# Patient Record
Sex: Female | Born: 1950 | Race: Black or African American | Hispanic: No | Marital: Single | Smoking: Current some day smoker
Health system: Southern US, Community
[De-identification: ages and names within clinical notes are randomized; demographics above are authoritative.]

## PROBLEM LIST (undated history)

## (undated) DIAGNOSIS — F2089 Other schizophrenia: Secondary | ICD-10-CM

## (undated) DIAGNOSIS — E785 Hyperlipidemia, unspecified: Secondary | ICD-10-CM

## (undated) DIAGNOSIS — G4730 Sleep apnea, unspecified: Secondary | ICD-10-CM

## (undated) DIAGNOSIS — G709 Myoneural disorder, unspecified: Secondary | ICD-10-CM

## (undated) DIAGNOSIS — G459 Transient cerebral ischemic attack, unspecified: Secondary | ICD-10-CM

## (undated) DIAGNOSIS — M1990 Unspecified osteoarthritis, unspecified site: Secondary | ICD-10-CM

## (undated) DIAGNOSIS — J189 Pneumonia, unspecified organism: Secondary | ICD-10-CM

## (undated) DIAGNOSIS — D649 Anemia, unspecified: Secondary | ICD-10-CM

## (undated) DIAGNOSIS — J449 Chronic obstructive pulmonary disease, unspecified: Secondary | ICD-10-CM

## (undated) DIAGNOSIS — I639 Cerebral infarction, unspecified: Secondary | ICD-10-CM

## (undated) DIAGNOSIS — C801 Malignant (primary) neoplasm, unspecified: Secondary | ICD-10-CM

## (undated) DIAGNOSIS — I10 Essential (primary) hypertension: Secondary | ICD-10-CM

## (undated) DIAGNOSIS — E1140 Type 2 diabetes mellitus with diabetic neuropathy, unspecified: Secondary | ICD-10-CM

## (undated) HISTORY — PX: CHOLECYSTECTOMY: SHX55

## (undated) HISTORY — PX: CAROTID ENDARTERECTOMY: SUR193

## (undated) HISTORY — DX: Hyperlipidemia, unspecified: E78.5

## (undated) HISTORY — DX: Anemia, unspecified: D64.9

## (undated) HISTORY — PX: TONSILLECTOMY: SUR1361

## (undated) HISTORY — DX: Unspecified osteoarthritis, unspecified site: M19.90

## (undated) HISTORY — DX: Malignant (primary) neoplasm, unspecified: C80.1

## (undated) HISTORY — DX: Cerebral infarction, unspecified: I63.9

---

## 2007-08-15 ENCOUNTER — Emergency Department (HOSPITAL_COMMUNITY): Admission: EM | Admit: 2007-08-15 | Discharge: 2007-08-16 | Payer: Self-pay | Admitting: Emergency Medicine

## 2007-08-29 ENCOUNTER — Emergency Department (HOSPITAL_COMMUNITY): Admission: EM | Admit: 2007-08-29 | Discharge: 2007-08-29 | Payer: Self-pay | Admitting: Emergency Medicine

## 2007-08-30 ENCOUNTER — Emergency Department (HOSPITAL_COMMUNITY): Admission: EM | Admit: 2007-08-30 | Discharge: 2007-08-31 | Payer: Self-pay | Admitting: Emergency Medicine

## 2007-09-13 ENCOUNTER — Emergency Department (HOSPITAL_COMMUNITY): Admission: EM | Admit: 2007-09-13 | Discharge: 2007-09-13 | Payer: Self-pay | Admitting: Emergency Medicine

## 2007-09-27 ENCOUNTER — Emergency Department (HOSPITAL_COMMUNITY): Admission: EM | Admit: 2007-09-27 | Discharge: 2007-09-27 | Payer: Self-pay | Admitting: Emergency Medicine

## 2007-10-13 ENCOUNTER — Ambulatory Visit: Payer: Self-pay | Admitting: Internal Medicine

## 2007-11-07 ENCOUNTER — Ambulatory Visit: Payer: Self-pay | Admitting: Internal Medicine

## 2007-11-15 ENCOUNTER — Ambulatory Visit: Payer: Self-pay | Admitting: Internal Medicine

## 2008-08-08 ENCOUNTER — Emergency Department (HOSPITAL_COMMUNITY): Admission: EM | Admit: 2008-08-08 | Discharge: 2008-08-08 | Payer: Self-pay | Admitting: Emergency Medicine

## 2008-10-25 ENCOUNTER — Emergency Department (HOSPITAL_COMMUNITY): Admission: EM | Admit: 2008-10-25 | Discharge: 2008-10-25 | Payer: Self-pay | Admitting: Emergency Medicine

## 2008-10-28 ENCOUNTER — Emergency Department (HOSPITAL_COMMUNITY): Admission: EM | Admit: 2008-10-28 | Discharge: 2008-10-28 | Payer: Self-pay | Admitting: Emergency Medicine

## 2008-10-28 ENCOUNTER — Emergency Department (HOSPITAL_COMMUNITY): Admission: EM | Admit: 2008-10-28 | Discharge: 2008-10-29 | Payer: Self-pay | Admitting: *Deleted

## 2008-11-11 ENCOUNTER — Emergency Department (HOSPITAL_COMMUNITY): Admission: EM | Admit: 2008-11-11 | Discharge: 2008-11-12 | Payer: Self-pay | Admitting: Emergency Medicine

## 2009-03-15 ENCOUNTER — Emergency Department (HOSPITAL_COMMUNITY): Admission: EM | Admit: 2009-03-15 | Discharge: 2009-03-16 | Payer: Self-pay | Admitting: General Surgery

## 2009-03-19 ENCOUNTER — Emergency Department (HOSPITAL_COMMUNITY): Admission: EM | Admit: 2009-03-19 | Discharge: 2009-03-20 | Payer: Self-pay | Admitting: Emergency Medicine

## 2009-03-20 ENCOUNTER — Encounter (INDEPENDENT_AMBULATORY_CARE_PROVIDER_SITE_OTHER): Payer: Self-pay | Admitting: *Deleted

## 2009-03-20 ENCOUNTER — Emergency Department (HOSPITAL_COMMUNITY): Admission: EM | Admit: 2009-03-20 | Discharge: 2009-03-21 | Payer: Self-pay | Admitting: Emergency Medicine

## 2009-04-25 ENCOUNTER — Encounter (INDEPENDENT_AMBULATORY_CARE_PROVIDER_SITE_OTHER): Payer: Self-pay | Admitting: *Deleted

## 2009-04-26 ENCOUNTER — Inpatient Hospital Stay (HOSPITAL_COMMUNITY): Admission: EM | Admit: 2009-04-26 | Discharge: 2009-04-28 | Payer: Self-pay | Admitting: Emergency Medicine

## 2009-04-27 ENCOUNTER — Encounter (INDEPENDENT_AMBULATORY_CARE_PROVIDER_SITE_OTHER): Payer: Self-pay | Admitting: *Deleted

## 2009-04-28 ENCOUNTER — Encounter (INDEPENDENT_AMBULATORY_CARE_PROVIDER_SITE_OTHER): Payer: Self-pay | Admitting: *Deleted

## 2009-05-06 ENCOUNTER — Emergency Department (HOSPITAL_COMMUNITY): Admission: EM | Admit: 2009-05-06 | Discharge: 2009-05-06 | Payer: Self-pay | Admitting: Emergency Medicine

## 2009-05-13 DIAGNOSIS — E119 Type 2 diabetes mellitus without complications: Secondary | ICD-10-CM

## 2009-05-13 DIAGNOSIS — I10 Essential (primary) hypertension: Secondary | ICD-10-CM | POA: Insufficient documentation

## 2009-05-13 DIAGNOSIS — J449 Chronic obstructive pulmonary disease, unspecified: Secondary | ICD-10-CM

## 2009-05-13 DIAGNOSIS — J4489 Other specified chronic obstructive pulmonary disease: Secondary | ICD-10-CM | POA: Insufficient documentation

## 2009-05-13 DIAGNOSIS — J45909 Unspecified asthma, uncomplicated: Secondary | ICD-10-CM | POA: Insufficient documentation

## 2009-05-14 ENCOUNTER — Ambulatory Visit: Payer: Self-pay | Admitting: Gastroenterology

## 2009-05-14 DIAGNOSIS — R197 Diarrhea, unspecified: Secondary | ICD-10-CM | POA: Insufficient documentation

## 2009-05-14 DIAGNOSIS — R079 Chest pain, unspecified: Secondary | ICD-10-CM | POA: Insufficient documentation

## 2009-05-23 ENCOUNTER — Ambulatory Visit: Payer: Self-pay | Admitting: Gastroenterology

## 2009-05-23 ENCOUNTER — Ambulatory Visit (HOSPITAL_COMMUNITY): Admission: RE | Admit: 2009-05-23 | Discharge: 2009-05-23 | Payer: Self-pay | Admitting: Gastroenterology

## 2009-06-25 ENCOUNTER — Ambulatory Visit (HOSPITAL_BASED_OUTPATIENT_CLINIC_OR_DEPARTMENT_OTHER): Admission: RE | Admit: 2009-06-25 | Discharge: 2009-06-25 | Payer: Self-pay | Admitting: Internal Medicine

## 2009-06-26 ENCOUNTER — Emergency Department (HOSPITAL_COMMUNITY): Admission: EM | Admit: 2009-06-26 | Discharge: 2009-06-26 | Payer: Self-pay | Admitting: Emergency Medicine

## 2009-06-28 ENCOUNTER — Ambulatory Visit: Payer: Self-pay | Admitting: Internal Medicine

## 2009-07-23 ENCOUNTER — Encounter (INDEPENDENT_AMBULATORY_CARE_PROVIDER_SITE_OTHER): Payer: Self-pay | Admitting: Internal Medicine

## 2009-07-23 ENCOUNTER — Ambulatory Visit: Payer: Self-pay | Admitting: Cardiovascular Disease

## 2009-07-23 ENCOUNTER — Inpatient Hospital Stay (HOSPITAL_COMMUNITY): Admission: EM | Admit: 2009-07-23 | Discharge: 2009-07-25 | Payer: Self-pay | Admitting: Emergency Medicine

## 2009-07-24 ENCOUNTER — Encounter: Payer: Self-pay | Admitting: Cardiovascular Disease

## 2009-08-18 ENCOUNTER — Ambulatory Visit: Payer: Self-pay | Admitting: Internal Medicine

## 2009-08-18 ENCOUNTER — Observation Stay (HOSPITAL_COMMUNITY): Admission: EM | Admit: 2009-08-18 | Discharge: 2009-08-19 | Payer: Self-pay | Admitting: Emergency Medicine

## 2009-11-15 ENCOUNTER — Emergency Department (HOSPITAL_COMMUNITY): Admission: EM | Admit: 2009-11-15 | Discharge: 2009-11-15 | Payer: Self-pay | Admitting: Emergency Medicine

## 2009-11-25 ENCOUNTER — Emergency Department (HOSPITAL_COMMUNITY): Admission: EM | Admit: 2009-11-25 | Discharge: 2009-11-25 | Payer: Self-pay | Admitting: Emergency Medicine

## 2009-11-28 ENCOUNTER — Emergency Department (HOSPITAL_COMMUNITY): Admission: EM | Admit: 2009-11-28 | Discharge: 2009-11-28 | Payer: Self-pay | Admitting: Emergency Medicine

## 2009-12-07 ENCOUNTER — Emergency Department (HOSPITAL_COMMUNITY): Admission: EM | Admit: 2009-12-07 | Discharge: 2009-12-07 | Payer: Self-pay | Admitting: Emergency Medicine

## 2009-12-07 ENCOUNTER — Encounter: Payer: Self-pay | Admitting: Cardiovascular Disease

## 2009-12-29 ENCOUNTER — Emergency Department (HOSPITAL_COMMUNITY): Admission: EM | Admit: 2009-12-29 | Discharge: 2009-12-29 | Payer: Self-pay | Admitting: Emergency Medicine

## 2010-04-14 ENCOUNTER — Emergency Department (HOSPITAL_COMMUNITY): Admission: EM | Admit: 2010-04-14 | Discharge: 2010-04-15 | Payer: Self-pay | Admitting: Emergency Medicine

## 2010-04-21 ENCOUNTER — Emergency Department (HOSPITAL_COMMUNITY): Admission: EM | Admit: 2010-04-21 | Discharge: 2010-04-21 | Payer: Self-pay | Admitting: Emergency Medicine

## 2010-04-24 ENCOUNTER — Emergency Department (HOSPITAL_COMMUNITY): Admission: EM | Admit: 2010-04-24 | Discharge: 2010-04-25 | Payer: Self-pay | Admitting: Emergency Medicine

## 2010-04-29 ENCOUNTER — Emergency Department (HOSPITAL_COMMUNITY): Admission: EM | Admit: 2010-04-29 | Discharge: 2010-04-29 | Payer: Self-pay | Admitting: Emergency Medicine

## 2010-05-03 ENCOUNTER — Emergency Department (HOSPITAL_COMMUNITY): Admission: EM | Admit: 2010-05-03 | Discharge: 2010-05-04 | Payer: Self-pay | Admitting: Emergency Medicine

## 2010-05-17 IMAGING — CR DG CHEST 1V PORT
1 series · 1 of 1 positions shown · non-contrast
Comparison: 07/22/2009

CLINICAL DATA: Chest pain

PORTABLE CHEST - 1 VIEW

[AP]
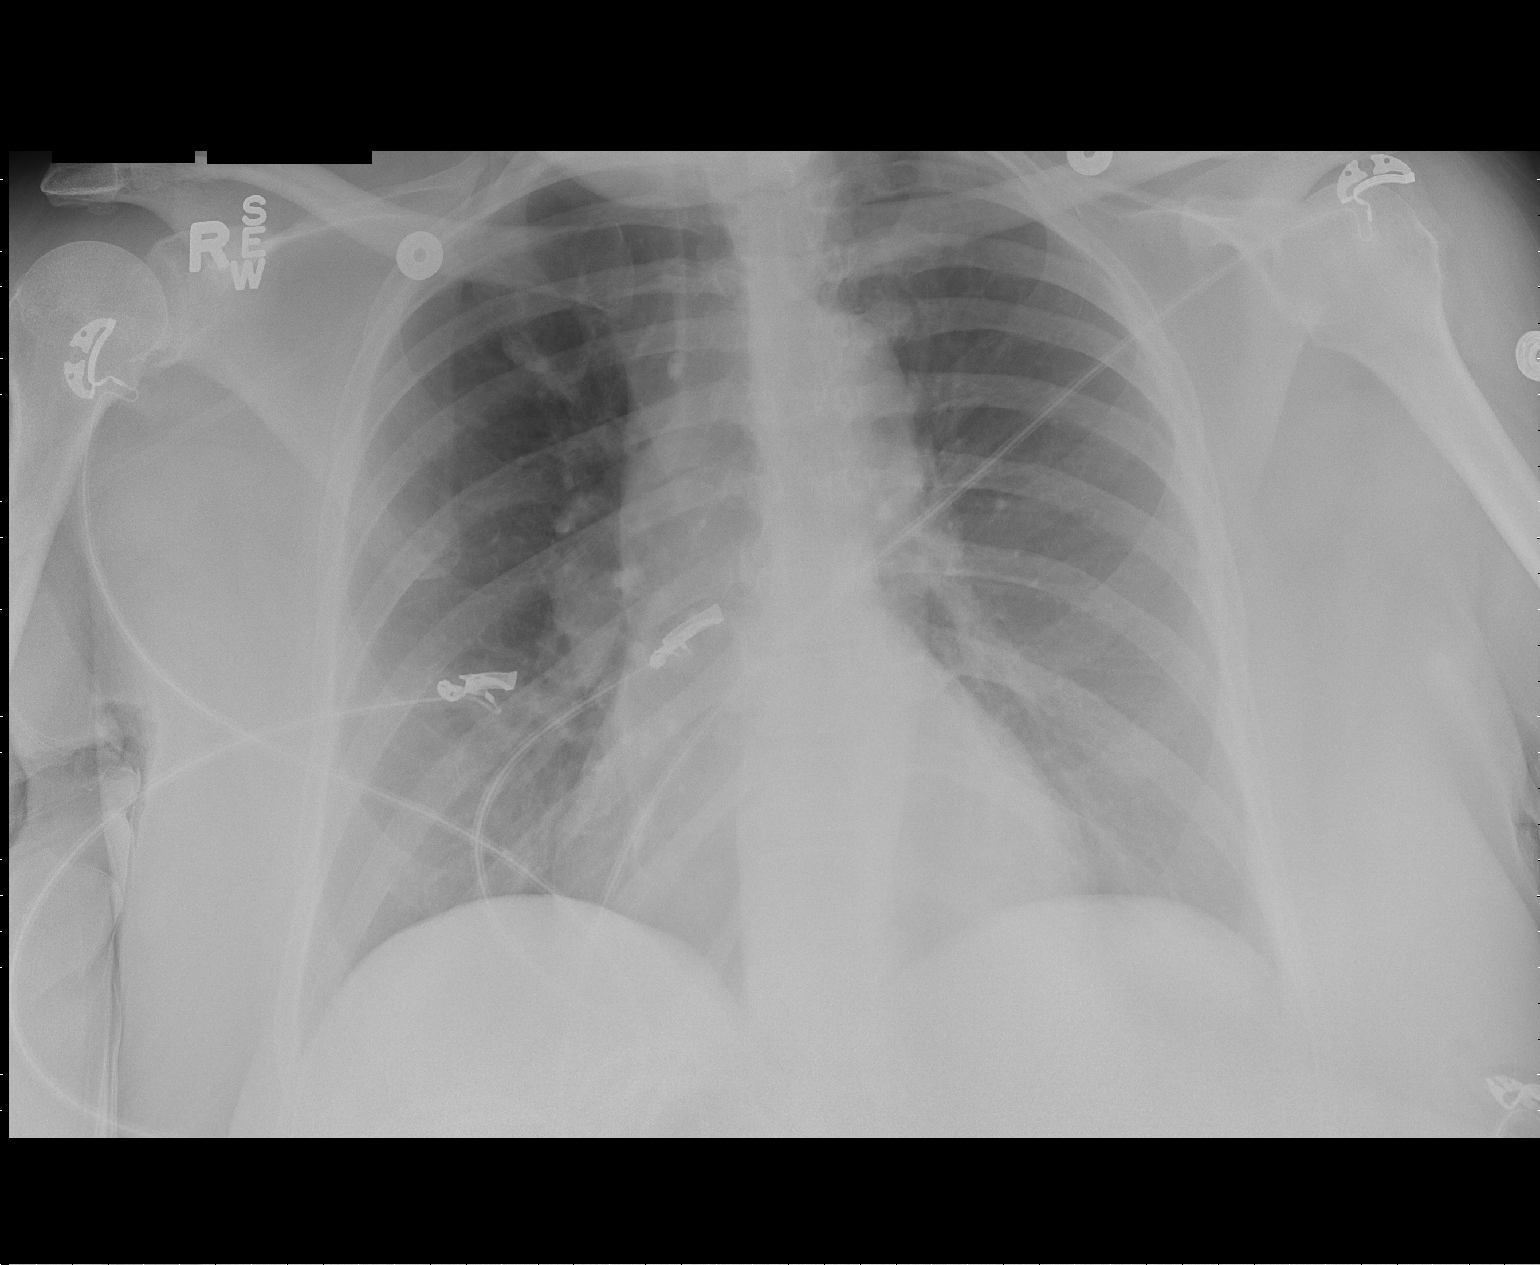

[1 of 1 positions shown; findings below may reference images not displayed]

FINDINGS: Heart size is at the upper limits of normal.  Lungs are
clear.  Incidental note is made of prominence of the right first
anterior costochondral cartilage.  No pleural effusion.
IMPRESSION: No acute cardiopulmonary process.

## 2010-05-26 ENCOUNTER — Encounter: Admission: RE | Admit: 2010-05-26 | Discharge: 2010-05-26 | Payer: Self-pay | Admitting: Gastroenterology

## 2010-06-23 ENCOUNTER — Emergency Department (HOSPITAL_COMMUNITY): Admission: EM | Admit: 2010-06-23 | Discharge: 2010-06-24 | Payer: Self-pay | Admitting: Emergency Medicine

## 2010-09-12 ENCOUNTER — Encounter (INDEPENDENT_AMBULATORY_CARE_PROVIDER_SITE_OTHER): Payer: Self-pay | Admitting: Internal Medicine

## 2010-09-12 ENCOUNTER — Inpatient Hospital Stay (HOSPITAL_COMMUNITY)
Admission: EM | Admit: 2010-09-12 | Discharge: 2010-09-14 | Payer: Self-pay | Source: Home / Self Care | Admitting: Emergency Medicine

## 2010-09-14 ENCOUNTER — Encounter (INDEPENDENT_AMBULATORY_CARE_PROVIDER_SITE_OTHER): Payer: Self-pay | Admitting: Internal Medicine

## 2010-11-22 ENCOUNTER — Encounter: Payer: Self-pay | Admitting: Gastroenterology

## 2010-11-22 ENCOUNTER — Encounter: Payer: Self-pay | Admitting: Internal Medicine

## 2010-12-02 NOTE — Consult Note (Signed)
[facility]: [facility]   Imported By: Roderic Ovens 12/22/2009 14:10:02  _____________________________________________________________________  External Attachment:    Type:   Image     Comment:   External Document

## 2011-01-12 LAB — GLUCOSE, CAPILLARY
Glucose-Capillary: 132 mg/dL — ABNORMAL HIGH (ref 70–99)
Glucose-Capillary: 133 mg/dL — ABNORMAL HIGH (ref 70–99)
Glucose-Capillary: 138 mg/dL — ABNORMAL HIGH (ref 70–99)
Glucose-Capillary: 138 mg/dL — ABNORMAL HIGH (ref 70–99)
Glucose-Capillary: 155 mg/dL — ABNORMAL HIGH (ref 70–99)

## 2011-01-12 LAB — DIFFERENTIAL
Basophils Absolute: 0.1 10*3/uL (ref 0.0–0.1)
Eosinophils Absolute: 0.5 10*3/uL (ref 0.0–0.7)
Eosinophils Relative: 4 % (ref 0–5)
Lymphs Abs: 4.2 10*3/uL — ABNORMAL HIGH (ref 0.7–4.0)
Lymphs Abs: 5.6 10*3/uL — ABNORMAL HIGH (ref 0.7–4.0)
Neutrophils Relative %: 47 % (ref 43–77)

## 2011-01-12 LAB — BASIC METABOLIC PANEL
Calcium: 8.9 mg/dL (ref 8.4–10.5)
Chloride: 103 mEq/L (ref 96–112)
Creatinine, Ser: 0.58 mg/dL (ref 0.4–1.2)
GFR calc Af Amer: >60 mL/min (ref >60)
GFR calc Af Amer: >60 mL/min (ref >60)
GFR calc non Af Amer: >60 mL/min (ref >60)
Potassium: 3.4 mEq/L — ABNORMAL LOW (ref 3.5–5.1)
Sodium: 141 mEq/L (ref 135–145)

## 2011-01-12 LAB — RAPID URINE DRUG SCREEN, HOSP PERFORMED
Amphetamines: NOT DETECTED
Benzodiazepines: NOT DETECTED

## 2011-01-12 LAB — CBC
HCT: 35.5 % — ABNORMAL LOW (ref 36.0–46.0)
HCT: 36.8 % (ref 36.0–46.0)
Hemoglobin: 11.4 g/dL — ABNORMAL LOW (ref 12.0–15.0)
MCH: 26.3 pg (ref 26.0–34.0)
MCH: 27 pg (ref 26.0–34.0)
MCV: 84.8 fL (ref 78.0–100.0)
Platelets: 433 10*3/uL — ABNORMAL HIGH (ref 150–400)
RBC: 4.17 MIL/uL (ref 3.87–5.11)
RBC: 4.19 MIL/uL (ref 3.87–5.11)
RBC: 4.34 MIL/uL (ref 3.87–5.11)
RDW: 16.8 % — ABNORMAL HIGH (ref 11.5–15.5)
WBC: 12.2 10*3/uL — ABNORMAL HIGH (ref 4.0–10.5)

## 2011-01-12 LAB — LIPID PANEL
Cholesterol: 162 mg/dL (ref 0–200)
LDL Cholesterol: 90 mg/dL (ref 0–99)
Triglycerides: 162 mg/dL — ABNORMAL HIGH (ref <150)
VLDL: 32 mg/dL (ref 0–40)

## 2011-01-12 LAB — URINALYSIS, ROUTINE W REFLEX MICROSCOPIC
Protein, ur: NEGATIVE mg/dL
Urobilinogen, UA: 0.2 mg/dL (ref 0.0–1.0)

## 2011-01-12 LAB — COMPREHENSIVE METABOLIC PANEL
ALT: 15 U/L (ref 0–35)
AST: 16 U/L (ref 0–37)
CO2: 29 mEq/L (ref 19–32)
Calcium: 8.9 mg/dL (ref 8.4–10.5)
Chloride: 107 mEq/L (ref 96–112)
GFR calc Af Amer: >60 mL/min (ref >60)
GFR calc non Af Amer: >60 mL/min (ref >60)
Sodium: 146 mEq/L — ABNORMAL HIGH (ref 135–145)

## 2011-01-12 LAB — TROPONIN I: Troponin I: 0.02 ng/mL (ref 0.00–0.06)

## 2011-01-12 LAB — APTT: aPTT: 28 seconds (ref 24–37)

## 2011-01-12 LAB — HEMOGLOBIN A1C: Hgb A1c MFr Bld: 7.3 % — ABNORMAL HIGH (ref <5.7)

## 2011-01-12 LAB — URINE CULTURE: Culture: NO GROWTH

## 2011-01-15 LAB — URINE CULTURE
Colony Count: 70000
Culture  Setup Time: 201108241035

## 2011-01-15 LAB — URINALYSIS, ROUTINE W REFLEX MICROSCOPIC
Bilirubin Urine: NEGATIVE
Glucose, UA: NEGATIVE mg/dL
Hgb urine dipstick: NEGATIVE
Ketones, ur: NEGATIVE mg/dL
pH: 6 (ref 5.0–8.0)

## 2011-01-15 LAB — DIFFERENTIAL
Basophils Absolute: 0.1 10*3/uL (ref 0.0–0.1)
Basophils Relative: 0 % (ref 0–1)
Monocytes Absolute: 1.1 10*3/uL — ABNORMAL HIGH (ref 0.1–1.0)
Neutro Abs: 11.7 10*3/uL — ABNORMAL HIGH (ref 1.7–7.7)
Neutrophils Relative %: 62 % (ref 43–77)

## 2011-01-15 LAB — POCT I-STAT, CHEM 8
Creatinine, Ser: 0.7 mg/dL (ref 0.4–1.2)
Hemoglobin: 14.3 g/dL (ref 12.0–15.0)
Sodium: 132 mEq/L — ABNORMAL LOW (ref 135–145)
TCO2: 27 mmol/L (ref 0–100)

## 2011-01-15 LAB — CBC
HCT: 36.5 % (ref 36.0–46.0)
MCHC: 33.4 g/dL (ref 30.0–36.0)
RDW: 16.3 % — ABNORMAL HIGH (ref 11.5–15.5)

## 2011-01-15 LAB — POCT CARDIAC MARKERS: Myoglobin, poc: 79 ng/mL (ref 12–200)

## 2011-01-15 LAB — RAPID STREP SCREEN (MED CTR MEBANE ONLY): Streptococcus, Group A Screen (Direct): NEGATIVE

## 2011-01-17 LAB — BASIC METABOLIC PANEL
CO2: 26 mEq/L (ref 19–32)
Calcium: 8.8 mg/dL (ref 8.4–10.5)
Creatinine, Ser: 0.61 mg/dL (ref 0.4–1.2)
GFR calc Af Amer: >60 mL/min (ref >60)
GFR calc non Af Amer: >60 mL/min (ref >60)
Sodium: 136 mEq/L (ref 135–145)

## 2011-01-17 LAB — DIFFERENTIAL
Basophils Absolute: 0 10*3/uL (ref 0.0–0.1)
Basophils Absolute: 0.1 10*3/uL (ref 0.0–0.1)
Basophils Relative: 0 % (ref 0–1)
Basophils Relative: 1 % (ref 0–1)
Eosinophils Absolute: 0.4 10*3/uL (ref 0.0–0.7)
Eosinophils Absolute: 0.5 10*3/uL (ref 0.0–0.7)
Eosinophils Relative: 3 % (ref 0–5)
Eosinophils Relative: 4 % (ref 0–5)
Lymphocytes Relative: 30 % (ref 12–46)
Lymphocytes Relative: 24 % (ref 12–46)
Lymphs Abs: 4.1 10*3/uL — ABNORMAL HIGH (ref 0.7–4.0)
Lymphs Abs: 5.4 10*3/uL — ABNORMAL HIGH (ref 0.7–4.0)
Monocytes Absolute: 1.1 10*3/uL — ABNORMAL HIGH (ref 0.1–1.0)
Monocytes Relative: 6 % (ref 3–12)
Monocytes Relative: 6 % (ref 3–12)
Neutro Abs: 6.6 10*3/uL (ref 1.7–7.7)
Neutro Abs: 11.1 10*3/uL — ABNORMAL HIGH (ref 1.7–7.7)
Neutro Abs: 9.6 10*3/uL — ABNORMAL HIGH (ref 1.7–7.7)
Neutrophils Relative %: 58 % (ref 43–77)
Neutrophils Relative %: 66 % (ref 43–77)

## 2011-01-17 LAB — GLUCOSE, CAPILLARY: Glucose-Capillary: 112 mg/dL — ABNORMAL HIGH (ref 70–99)

## 2011-01-17 LAB — COMPREHENSIVE METABOLIC PANEL
ALT: 11 U/L (ref 0–35)
ALT: <8 U/L (ref 0–35)
Alkaline Phosphatase: 75 U/L (ref 39–117)
Alkaline Phosphatase: 76 U/L (ref 39–117)
BUN: 7 mg/dL (ref 6–23)
CO2: 25 mEq/L (ref 19–32)
CO2: 27 mEq/L (ref 19–32)
Calcium: 8.8 mg/dL (ref 8.4–10.5)
Calcium: 8.6 mg/dL (ref 8.4–10.5)
GFR calc non Af Amer: >60 mL/min (ref >60)
GFR calc non Af Amer: >60 mL/min (ref >60)
Glucose, Bld: 105 mg/dL — ABNORMAL HIGH (ref 70–99)
Glucose, Bld: 132 mg/dL — ABNORMAL HIGH (ref 70–99)
Potassium: 3.6 mEq/L (ref 3.5–5.1)
Sodium: 136 mEq/L (ref 135–145)
Sodium: 134 mEq/L — ABNORMAL LOW (ref 135–145)
Total Bilirubin: 0.3 mg/dL (ref 0.3–1.2)
Total Protein: 6.5 g/dL (ref 6.0–8.3)

## 2011-01-17 LAB — URINALYSIS, ROUTINE W REFLEX MICROSCOPIC
Bilirubin Urine: NEGATIVE
Glucose, UA: NEGATIVE mg/dL
Hgb urine dipstick: NEGATIVE
Ketones, ur: NEGATIVE mg/dL
Nitrite: NEGATIVE
Protein, ur: NEGATIVE mg/dL
Protein, ur: NEGATIVE mg/dL
Specific Gravity, Urine: 1.006 (ref 1.005–1.030)
Urobilinogen, UA: 0.2 mg/dL (ref 0.0–1.0)
pH: 5 (ref 5.0–8.0)

## 2011-01-17 LAB — CBC
HCT: 36.3 % (ref 36.0–46.0)
HCT: 41.5 % (ref 36.0–46.0)
Hemoglobin: 11.3 g/dL — ABNORMAL LOW (ref 12.0–15.0)
MCHC: 32.7 g/dL (ref 30.0–36.0)
MCHC: 33.5 g/dL (ref 30.0–36.0)
MCHC: 32.6 g/dL (ref 30.0–36.0)
MCV: 85.3 fL (ref 78.0–100.0)
Platelets: 355 10*3/uL (ref 150–400)
Platelets: 432 10*3/uL — ABNORMAL HIGH (ref 150–400)
Platelets: 359 10*3/uL (ref 150–400)
RBC: 4.06 MIL/uL (ref 3.87–5.11)
RBC: 4.3 MIL/uL (ref 3.87–5.11)
RDW: 15.6 % — ABNORMAL HIGH (ref 11.5–15.5)
RDW: 15.7 % — ABNORMAL HIGH (ref 11.5–15.5)
RDW: 17.2 % — ABNORMAL HIGH (ref 11.5–15.5)
WBC: 16.6 10*3/uL — ABNORMAL HIGH (ref 4.0–10.5)
WBC: 16.9 10*3/uL — ABNORMAL HIGH (ref 4.0–10.5)

## 2011-01-17 LAB — POCT I-STAT, CHEM 8
BUN: 8 mg/dL (ref 6–23)
HCT: 45 % (ref 36.0–46.0)
Sodium: 135 mEq/L (ref 135–145)
TCO2: 25 mmol/L (ref 0–100)

## 2011-01-17 LAB — POCT CARDIAC MARKERS
CKMB, poc: 1.1 ng/mL (ref 1.0–8.0)
Myoglobin, poc: 74 ng/mL (ref 12–200)
Troponin i, poc: <0.05 ng/mL (ref 0.00–0.09)

## 2011-01-17 LAB — RAPID URINE DRUG SCREEN, HOSP PERFORMED
Amphetamines: NOT DETECTED
Benzodiazepines: NOT DETECTED
Cocaine: NOT DETECTED
Opiates: NOT DETECTED
Tetrahydrocannabinol: NOT DETECTED

## 2011-01-17 LAB — LIPASE, BLOOD: Lipase: 27 U/L (ref 11–59)

## 2011-01-20 LAB — RAPID URINE DRUG SCREEN, HOSP PERFORMED
Amphetamines: NOT DETECTED
Barbiturates: NOT DETECTED
Benzodiazepines: NOT DETECTED
Opiates: NOT DETECTED
Tetrahydrocannabinol: NOT DETECTED

## 2011-01-20 LAB — BASIC METABOLIC PANEL
BUN: 7 mg/dL (ref 6–23)
CO2: 24 mEq/L (ref 19–32)
Chloride: 105 mEq/L (ref 96–112)
GFR calc non Af Amer: >60 mL/min (ref >60)
Glucose, Bld: 116 mg/dL — ABNORMAL HIGH (ref 70–99)
Potassium: 3.8 mEq/L (ref 3.5–5.1)
Sodium: 139 mEq/L (ref 135–145)

## 2011-01-20 LAB — CBC
HCT: 34.6 % — ABNORMAL LOW (ref 36.0–46.0)
HCT: 39 % (ref 36.0–46.0)
Hemoglobin: 11.6 g/dL — ABNORMAL LOW (ref 12.0–15.0)
MCHC: 33.5 g/dL (ref 30.0–36.0)
MCV: 84.8 fL (ref 78.0–100.0)
Platelets: 353 10*3/uL (ref 150–400)
RDW: 16.1 % — ABNORMAL HIGH (ref 11.5–15.5)
RDW: 17 % — ABNORMAL HIGH (ref 11.5–15.5)

## 2011-01-20 LAB — POCT CARDIAC MARKERS: CKMB, poc: 1.2 ng/mL (ref 1.0–8.0)

## 2011-01-20 LAB — DIFFERENTIAL
Basophils Absolute: 0.1 10*3/uL (ref 0.0–0.1)
Basophils Absolute: 0.1 10*3/uL (ref 0.0–0.1)
Eosinophils Absolute: 0.5 10*3/uL (ref 0.0–0.7)
Eosinophils Absolute: 0.7 10*3/uL (ref 0.0–0.7)
Eosinophils Relative: 3 % (ref 0–5)
Eosinophils Relative: 5 % (ref 0–5)
Lymphocytes Relative: 29 % (ref 12–46)
Lymphocytes Relative: 34 % (ref 12–46)
Monocytes Absolute: 1 10*3/uL (ref 0.1–1.0)
Neutrophils Relative %: 59 % (ref 43–77)

## 2011-01-20 LAB — PROTIME-INR
INR: 0.96 (ref 0.00–1.49)
Prothrombin Time: 12.7 seconds (ref 11.6–15.2)

## 2011-01-20 LAB — POCT I-STAT, CHEM 8
Calcium, Ion: 1.12 mmol/L (ref 1.12–1.32)
Glucose, Bld: 131 mg/dL — ABNORMAL HIGH (ref 70–99)
HCT: 40 % (ref 36.0–46.0)
Hemoglobin: 13.6 g/dL (ref 12.0–15.0)
Potassium: 3.7 mEq/L (ref 3.5–5.1)

## 2011-02-04 LAB — GLUCOSE, CAPILLARY
Glucose-Capillary: 135 mg/dL — ABNORMAL HIGH (ref 70–99)
Glucose-Capillary: 140 mg/dL — ABNORMAL HIGH (ref 70–99)
Glucose-Capillary: 187 mg/dL — ABNORMAL HIGH (ref 70–99)
Glucose-Capillary: 204 mg/dL — ABNORMAL HIGH (ref 70–99)
Glucose-Capillary: 94 mg/dL (ref 70–99)

## 2011-02-04 LAB — COMPREHENSIVE METABOLIC PANEL
ALT: 26 U/L (ref 0–35)
AST: 42 U/L — ABNORMAL HIGH (ref 0–37)
Albumin: 3.2 g/dL — ABNORMAL LOW (ref 3.5–5.2)
Alkaline Phosphatase: 78 U/L (ref 39–117)
BUN: 5 mg/dL — ABNORMAL LOW (ref 6–23)
CO2: 27 mEq/L (ref 19–32)
Calcium: 8.8 mg/dL (ref 8.4–10.5)
Chloride: 103 mEq/L (ref 96–112)
Creatinine, Ser: 0.52 mg/dL (ref 0.4–1.2)
GFR calc Af Amer: >60 mL/min (ref >60)
GFR calc non Af Amer: >60 mL/min (ref >60)
Glucose, Bld: 137 mg/dL — ABNORMAL HIGH (ref 70–99)
Potassium: 3.6 mEq/L (ref 3.5–5.1)
Sodium: 140 mEq/L (ref 135–145)
Total Bilirubin: 0.1 mg/dL — ABNORMAL LOW (ref 0.3–1.2)
Total Protein: 6.9 g/dL (ref 6.0–8.3)

## 2011-02-04 LAB — CBC
HCT: 37.7 % (ref 36.0–46.0)
Hemoglobin: 12.5 g/dL (ref 12.0–15.0)
MCHC: 33.2 g/dL (ref 30.0–36.0)
MCHC: 33.5 g/dL (ref 30.0–36.0)
MCV: 84.8 fL (ref 78.0–100.0)
MCV: 85.1 fL (ref 78.0–100.0)
Platelets: 385 10*3/uL (ref 150–400)
Platelets: 399 10*3/uL (ref 150–400)
RBC: 4.43 MIL/uL (ref 3.87–5.11)
RDW: 18 % — ABNORMAL HIGH (ref 11.5–15.5)
RDW: 18.3 % — ABNORMAL HIGH (ref 11.5–15.5)
WBC: 13.9 10*3/uL — ABNORMAL HIGH (ref 4.0–10.5)

## 2011-02-04 LAB — DIFFERENTIAL
Basophils Absolute: 0.1 10*3/uL (ref 0.0–0.1)
Basophils Relative: 1 % (ref 0–1)
Eosinophils Absolute: 0.5 10*3/uL (ref 0.0–0.7)
Monocytes Relative: 8 % (ref 3–12)
Neutro Abs: 8.3 10*3/uL — ABNORMAL HIGH (ref 1.7–7.7)
Neutrophils Relative %: 57 % (ref 43–77)

## 2011-02-04 LAB — PROTIME-INR
INR: 0.96 (ref 0.00–1.49)
Prothrombin Time: 12.7 seconds (ref 11.6–15.2)

## 2011-02-04 LAB — CARDIAC PANEL(CRET KIN+CKTOT+MB+TROPI)
CK, MB: 1.2 ng/mL (ref 0.3–4.0)
Relative Index: INVALID (ref 0.0–2.5)
Total CK: 82 U/L (ref 7–177)
Total CK: 89 U/L (ref 7–177)
Troponin I: 0.01 ng/mL (ref 0.00–0.06)

## 2011-02-04 LAB — BASIC METABOLIC PANEL
BUN: 6 mg/dL (ref 6–23)
CO2: 27 mEq/L (ref 19–32)
Calcium: 8.8 mg/dL (ref 8.4–10.5)
Chloride: 103 mEq/L (ref 96–112)
Creatinine, Ser: 0.45 mg/dL (ref 0.4–1.2)
GFR calc Af Amer: >60 mL/min (ref >60)
Glucose, Bld: 127 mg/dL — ABNORMAL HIGH (ref 70–99)

## 2011-02-04 LAB — HEMOGLOBIN A1C
Hgb A1c MFr Bld: 6.5 % — ABNORMAL HIGH (ref 4.6–6.1)
Mean Plasma Glucose: 140 mg/dL

## 2011-02-04 LAB — POCT CARDIAC MARKERS
CKMB, poc: 1.7 ng/mL (ref 1.0–8.0)
CKMB, poc: <1 ng/mL — ABNORMAL LOW (ref 1.0–8.0)
Myoglobin, poc: 43.7 ng/mL (ref 12–200)
Myoglobin, poc: 44 ng/mL (ref 12–200)

## 2011-02-04 LAB — LIPID PANEL
Cholesterol: 144 mg/dL (ref 0–200)
HDL: 44 mg/dL (ref >39)

## 2011-02-05 LAB — CBC
HCT: 38.9 % (ref 36.0–46.0)
HCT: 40.7 % (ref 36.0–46.0)
Hemoglobin: 12.8 g/dL (ref 12.0–15.0)
MCHC: 32.6 g/dL (ref 30.0–36.0)
MCV: 84.3 fL (ref 78.0–100.0)
MCV: 84.4 fL (ref 78.0–100.0)
RBC: 4.82 MIL/uL (ref 3.87–5.11)
WBC: 10.1 10*3/uL (ref 4.0–10.5)
WBC: 13.8 10*3/uL — ABNORMAL HIGH (ref 4.0–10.5)

## 2011-02-05 LAB — CK TOTAL AND CKMB (NOT AT ARMC)
CK, MB: 1.3 ng/mL (ref 0.3–4.0)
Total CK: 106 U/L (ref 7–177)

## 2011-02-05 LAB — POCT I-STAT, CHEM 8
Chloride: 104 mEq/L (ref 96–112)
Glucose, Bld: 94 mg/dL (ref 70–99)
HCT: 39 % (ref 36.0–46.0)
Hemoglobin: 13.3 g/dL (ref 12.0–15.0)
Potassium: 3.2 mEq/L — ABNORMAL LOW (ref 3.5–5.1)
Sodium: 140 mEq/L (ref 135–145)

## 2011-02-05 LAB — COMPREHENSIVE METABOLIC PANEL
ALT: 17 U/L (ref 0–35)
AST: 16 U/L (ref 0–37)
Albumin: 3.1 g/dL — ABNORMAL LOW (ref 3.5–5.2)
Alkaline Phosphatase: 69 U/L (ref 39–117)
CO2: 26 mEq/L (ref 19–32)
Chloride: 104 mEq/L (ref 96–112)
GFR calc Af Amer: >60 mL/min (ref >60)
GFR calc non Af Amer: >60 mL/min (ref >60)
Potassium: 3.9 mEq/L (ref 3.5–5.1)
Total Bilirubin: 0.5 mg/dL (ref 0.3–1.2)

## 2011-02-05 LAB — RAPID URINE DRUG SCREEN, HOSP PERFORMED
Barbiturates: NOT DETECTED
Benzodiazepines: NOT DETECTED
Cocaine: NOT DETECTED

## 2011-02-05 LAB — GLUCOSE, CAPILLARY
Glucose-Capillary: 121 mg/dL — ABNORMAL HIGH (ref 70–99)
Glucose-Capillary: 71 mg/dL (ref 70–99)

## 2011-02-05 LAB — LIPASE, BLOOD: Lipase: 17 U/L (ref 11–59)

## 2011-02-05 LAB — PROTIME-INR
INR: 0.9 (ref 0.00–1.49)
Prothrombin Time: 12.5 seconds (ref 11.6–15.2)

## 2011-02-05 LAB — BASIC METABOLIC PANEL
Chloride: 103 mEq/L (ref 96–112)
GFR calc non Af Amer: >60 mL/min (ref >60)
Glucose, Bld: 124 mg/dL — ABNORMAL HIGH (ref 70–99)
Potassium: 4 mEq/L (ref 3.5–5.1)
Sodium: 137 mEq/L (ref 135–145)

## 2011-02-05 LAB — TSH: TSH: 4.333 u[IU]/mL (ref 0.350–4.500)

## 2011-02-05 LAB — CARDIAC PANEL(CRET KIN+CKTOT+MB+TROPI)
CK, MB: 0.9 ng/mL (ref 0.3–4.0)
CK, MB: 1.2 ng/mL (ref 0.3–4.0)
Total CK: 92 U/L (ref 7–177)
Troponin I: 0.02 ng/mL (ref 0.00–0.06)

## 2011-02-05 LAB — LIPID PANEL
HDL: 43 mg/dL (ref >39)
Triglycerides: 101 mg/dL (ref <150)
VLDL: 20 mg/dL (ref 0–40)

## 2011-02-05 LAB — POCT CARDIAC MARKERS: Troponin i, poc: <0.05 ng/mL (ref 0.00–0.09)

## 2011-02-05 LAB — D-DIMER, QUANTITATIVE: D-Dimer, Quant: 0.25 ug/mL-FEU (ref 0.00–0.48)

## 2011-02-06 LAB — DIFFERENTIAL
Basophils Absolute: 0.2 K/uL — ABNORMAL HIGH (ref 0.0–0.1)
Basophils Relative: 1 % (ref 0–1)
Eosinophils Absolute: 0.5 K/uL (ref 0.0–0.7)
Eosinophils Relative: 4 % (ref 0–5)
Lymphocytes Relative: 27 % (ref 12–46)
Lymphs Abs: 3.8 K/uL (ref 0.7–4.0)
Monocytes Absolute: 0.8 K/uL (ref 0.1–1.0)
Monocytes Relative: 6 % (ref 3–12)
Neutro Abs: 8.7 K/uL — ABNORMAL HIGH (ref 1.7–7.7)
Neutrophils Relative %: 62 % (ref 43–77)

## 2011-02-06 LAB — CBC
HCT: 38.3 % (ref 36.0–46.0)
Hemoglobin: 12.7 g/dL (ref 12.0–15.0)
MCHC: 33.2 g/dL (ref 30.0–36.0)
MCV: 83.3 fL (ref 78.0–100.0)
Platelets: 429 K/uL — ABNORMAL HIGH (ref 150–400)
RBC: 4.59 MIL/uL (ref 3.87–5.11)
RDW: 16.9 % — ABNORMAL HIGH (ref 11.5–15.5)
WBC: 14 K/uL — ABNORMAL HIGH (ref 4.0–10.5)

## 2011-02-06 LAB — URINALYSIS, ROUTINE W REFLEX MICROSCOPIC
Bilirubin Urine: NEGATIVE
Glucose, UA: NEGATIVE mg/dL
Hgb urine dipstick: NEGATIVE
Ketones, ur: NEGATIVE mg/dL
Nitrite: NEGATIVE
Protein, ur: NEGATIVE mg/dL
Specific Gravity, Urine: 1.003 — ABNORMAL LOW (ref 1.005–1.030)
Urobilinogen, UA: 0.2 mg/dL (ref 0.0–1.0)
pH: 6 (ref 5.0–8.0)

## 2011-02-06 LAB — POCT PREGNANCY, URINE: Preg Test, Ur: NEGATIVE

## 2011-02-06 LAB — WET PREP, GENITAL
Clue Cells Wet Prep HPF POC: NONE SEEN
Trich, Wet Prep: NONE SEEN
Yeast Wet Prep HPF POC: NONE SEEN

## 2011-02-06 LAB — BASIC METABOLIC PANEL
BUN: 7 mg/dL (ref 6–23)
GFR calc Af Amer: >60 mL/min (ref >60)
GFR calc non Af Amer: >60 mL/min (ref >60)
Potassium: 3.2 mEq/L — ABNORMAL LOW (ref 3.5–5.1)
Sodium: 140 mEq/L (ref 135–145)

## 2011-02-06 LAB — PROTIME-INR
INR: 1 (ref 0.00–1.49)
Prothrombin Time: 13.4 seconds (ref 11.6–15.2)

## 2011-02-06 LAB — URINE MICROSCOPIC-ADD ON

## 2011-02-08 LAB — DIFFERENTIAL
Eosinophils Absolute: 0.5 10*3/uL (ref 0.0–0.7)
Lymphocytes Relative: 32 % (ref 12–46)
Lymphs Abs: 4.1 10*3/uL — ABNORMAL HIGH (ref 0.7–4.0)
Neutrophils Relative %: 57 % (ref 43–77)

## 2011-02-08 LAB — POCT CARDIAC MARKERS
CKMB, poc: 1.1 ng/mL (ref 1.0–8.0)
CKMB, poc: 1.8 ng/mL (ref 1.0–8.0)
Myoglobin, poc: 115 ng/mL (ref 12–200)
Myoglobin, poc: 53.1 ng/mL (ref 12–200)
Troponin i, poc: <0.05 ng/mL (ref 0.00–0.09)
Troponin i, poc: <0.05 ng/mL (ref 0.00–0.09)

## 2011-02-08 LAB — APTT: aPTT: 26 seconds (ref 24–37)

## 2011-02-08 LAB — LIPASE, BLOOD: Lipase: 20 U/L (ref 11–59)

## 2011-02-08 LAB — GLUCOSE, CAPILLARY
Glucose-Capillary: 104 mg/dL — ABNORMAL HIGH (ref 70–99)
Glucose-Capillary: 108 mg/dL — ABNORMAL HIGH (ref 70–99)
Glucose-Capillary: 111 mg/dL — ABNORMAL HIGH (ref 70–99)
Glucose-Capillary: 122 mg/dL — ABNORMAL HIGH (ref 70–99)
Glucose-Capillary: 157 mg/dL — ABNORMAL HIGH (ref 70–99)
Glucose-Capillary: 79 mg/dL (ref 70–99)
Glucose-Capillary: 84 mg/dL (ref 70–99)
Glucose-Capillary: 88 mg/dL (ref 70–99)
Glucose-Capillary: 91 mg/dL (ref 70–99)
Glucose-Capillary: 93 mg/dL (ref 70–99)

## 2011-02-08 LAB — COMPREHENSIVE METABOLIC PANEL
CO2: 26 mEq/L (ref 19–32)
Calcium: 8.7 mg/dL (ref 8.4–10.5)
Creatinine, Ser: 0.52 mg/dL (ref 0.4–1.2)
GFR calc non Af Amer: >60 mL/min (ref >60)
Glucose, Bld: 101 mg/dL — ABNORMAL HIGH (ref 70–99)

## 2011-02-08 LAB — HEMOGLOBIN A1C: Hgb A1c MFr Bld: 7 % — ABNORMAL HIGH (ref 4.6–6.1)

## 2011-02-08 LAB — CBC
HCT: 37 % (ref 36.0–46.0)
Hemoglobin: 12.1 g/dL (ref 12.0–15.0)
MCHC: 32.4 g/dL (ref 30.0–36.0)
MCHC: 32.8 g/dL (ref 30.0–36.0)
MCV: 83.6 fL (ref 78.0–100.0)
MCV: 83.9 fL (ref 78.0–100.0)
Platelets: 471 10*3/uL — ABNORMAL HIGH (ref 150–400)
RBC: 4.4 MIL/uL (ref 3.87–5.11)
RDW: 16.1 % — ABNORMAL HIGH (ref 11.5–15.5)
WBC: 10.8 10*3/uL — ABNORMAL HIGH (ref 4.0–10.5)

## 2011-02-08 LAB — CARDIAC PANEL(CRET KIN+CKTOT+MB+TROPI)
CK, MB: 1.8 ng/mL (ref 0.3–4.0)
CK, MB: 1.8 ng/mL (ref 0.3–4.0)
Relative Index: INVALID (ref 0.0–2.5)
Total CK: 105 U/L (ref 7–177)
Total CK: 116 U/L (ref 7–177)
Troponin I: 0.02 ng/mL (ref 0.00–0.06)
Troponin I: 0.02 ng/mL (ref 0.00–0.06)

## 2011-02-08 LAB — PROTIME-INR: Prothrombin Time: 13.8 seconds (ref 11.6–15.2)

## 2011-02-09 LAB — DIFFERENTIAL
Basophils Absolute: 0.2 10*3/uL — ABNORMAL HIGH (ref 0.0–0.1)
Basophils Absolute: 0.1 10*3/uL (ref 0.0–0.1)
Basophils Relative: 1 % (ref 0–1)
Eosinophils Absolute: 0.5 10*3/uL (ref 0.0–0.7)
Eosinophils Relative: 4 % (ref 0–5)
Lymphocytes Relative: 37 % (ref 12–46)
Lymphs Abs: 4.8 10*3/uL — ABNORMAL HIGH (ref 0.7–4.0)
Monocytes Relative: 7 % (ref 3–12)
Neutro Abs: 5.8 10*3/uL (ref 1.7–7.7)
Neutrophils Relative %: 52 % (ref 43–77)
Neutrophils Relative %: 51 % (ref 43–77)

## 2011-02-09 LAB — URINALYSIS, ROUTINE W REFLEX MICROSCOPIC
Bilirubin Urine: NEGATIVE
Ketones, ur: NEGATIVE mg/dL
Nitrite: NEGATIVE
Protein, ur: NEGATIVE mg/dL
Urobilinogen, UA: 0.2 mg/dL (ref 0.0–1.0)
pH: 6 (ref 5.0–8.0)

## 2011-02-09 LAB — COMPREHENSIVE METABOLIC PANEL
ALT: 11 U/L (ref 0–35)
Alkaline Phosphatase: 66 U/L (ref 39–117)
Alkaline Phosphatase: 63 U/L (ref 39–117)
BUN: 5 mg/dL — ABNORMAL LOW (ref 6–23)
CO2: 26 mEq/L (ref 19–32)
Chloride: 100 mEq/L (ref 96–112)
GFR calc non Af Amer: >60 mL/min (ref >60)
Glucose, Bld: 122 mg/dL — ABNORMAL HIGH (ref 70–99)
Glucose, Bld: 164 mg/dL — ABNORMAL HIGH (ref 70–99)
Potassium: 4.9 mEq/L (ref 3.5–5.1)
Potassium: 3.2 mEq/L — ABNORMAL LOW (ref 3.5–5.1)
Sodium: 135 mEq/L (ref 135–145)
Total Bilirubin: 1.5 mg/dL — ABNORMAL HIGH (ref 0.3–1.2)
Total Protein: 7 g/dL (ref 6.0–8.3)

## 2011-02-09 LAB — BLOOD GAS, ARTERIAL
Bicarbonate: 28.6 mEq/L — ABNORMAL HIGH (ref 20.0–24.0)
Drawn by: 316061
FIO2: 0.21 %
O2 Saturation: 96.7 %
Patient temperature: 98.6
pH, Arterial: 7.429 — ABNORMAL HIGH (ref 7.350–7.400)

## 2011-02-09 LAB — D-DIMER, QUANTITATIVE
D-Dimer, Quant: <0.22 ug/mL-FEU (ref 0.00–0.48)
D-Dimer, Quant: 0.31 ug/mL-FEU (ref 0.00–0.48)

## 2011-02-09 LAB — POCT I-STAT, CHEM 8
HCT: 42 % (ref 36.0–46.0)
Hemoglobin: 14.3 g/dL (ref 12.0–15.0)
Sodium: 139 mEq/L (ref 135–145)
TCO2: 25 mmol/L (ref 0–100)

## 2011-02-09 LAB — CBC
HCT: 38.5 % (ref 36.0–46.0)
HCT: 38.9 % (ref 36.0–46.0)
Hemoglobin: 12.7 g/dL (ref 12.0–15.0)
MCV: 83.8 fL (ref 78.0–100.0)
Platelets: 355 10*3/uL (ref 150–400)
RBC: 4.59 MIL/uL (ref 3.87–5.11)
RBC: 4.72 MIL/uL (ref 3.87–5.11)
RDW: 16.1 % — ABNORMAL HIGH (ref 11.5–15.5)
WBC: 13.2 10*3/uL — ABNORMAL HIGH (ref 4.0–10.5)
WBC: 11.5 10*3/uL — ABNORMAL HIGH (ref 4.0–10.5)

## 2011-02-09 LAB — LIPASE, BLOOD: Lipase: 87 U/L — ABNORMAL HIGH (ref 11–59)

## 2011-02-09 LAB — POCT CARDIAC MARKERS: Myoglobin, poc: 64.8 ng/mL (ref 12–200)

## 2011-02-09 LAB — BASIC METABOLIC PANEL
BUN: 6 mg/dL (ref 6–23)
Creatinine, Ser: 0.62 mg/dL (ref 0.4–1.2)
GFR calc non Af Amer: >60 mL/min (ref >60)
Glucose, Bld: 118 mg/dL — ABNORMAL HIGH (ref 70–99)
Potassium: 3.6 mEq/L (ref 3.5–5.1)

## 2011-02-09 LAB — GLUCOSE, CAPILLARY: Glucose-Capillary: 132 mg/dL — ABNORMAL HIGH (ref 70–99)

## 2011-02-09 LAB — CK TOTAL AND CKMB (NOT AT ARMC)
CK, MB: 3.1 ng/mL (ref 0.3–4.0)
Total CK: 196 U/L — ABNORMAL HIGH (ref 7–177)

## 2011-02-09 LAB — TROPONIN I: Troponin I: 0.03 ng/mL (ref 0.00–0.06)

## 2011-02-09 LAB — BRAIN NATRIURETIC PEPTIDE: Pro B Natriuretic peptide (BNP): 50.5 pg/mL (ref 0.0–100.0)

## 2011-03-16 NOTE — Discharge Summary (Signed)
NAMEYUKI, BRUNSMAN                 ACCOUNT NO.:  [id number]   MEDICAL RECORD NO.:  [id number]          PATIENT TYPE:  INP   LOCATION:  3703                         FACILITY:  [facility]   PHYSICIAN:  Fleet Contras, M.D.    DATE OF BIRTH:  01-09-1951   DATE OF ADMISSION:  04/25/2009  DATE OF DISCHARGE:  04/28/2009                               DISCHARGE SUMMARY   HISTORY OF PRESENT ILLNESS:  Ms. Bowdoin is a 58-year African American  lady with past medical history significant for systemic hypertension,  type 2 diabetes mellitus, bronchial asthma with COPD and schizophrenia  who was resident at [facility].  She was  admitted via the emergency room with 1-day history of substernal chest  pain of several hours' duration associated with some nausea but no  vomiting, diaphoresis, shortness of breath or wheezing.  She was treated  with sublingual nitro and apparently her symptoms resolved but was  admitted for further evaluation due to multiple coronary risk factors.   HOSPITAL COURSE:  On admission the patient was placed on telemetry,  placed on heart healthy diet.  Her home medications were continued  including Benicar, Lantus insulin and sliding scale NovoLog insulin.  CBGs were checked a.c. and h.s.  A CT scan of the chest was performed to  rule out aneurysm or dissection and this was negative showing mild  cardiomegaly and coronary artery calcifications.  There was no pulmonary  embolism.  She was placed on subcutaneous Lovenox for DVT prophylaxis.  Serial CPK and troponin were performed q.8 hours x3 and these were all  negative.  Her blood pressure was slightly elevated and her present  medications were adjusted appropriately.  Today she is feeling much  better and she is eager to go home.  She has not had any further chest  pain since admission.  Blood pressure is 156/68, heart rate of 73  regular, respiratory rate of 18, temperature 98.1, O2 saturations on  room  air is 99%.  Her chest is clear to auscultation.  Heart sounds 1  and 2 were heard with no murmurs.  Abdomen is benign.  Extremities shows  no edema or calf tenderness or swelling.  CNS; She is alert and oriented  x3 with no focal neurological deficits.  She is now considered stable  for discharge home.   DISCHARGE DIAGNOSES:  1. Atypical chest pain with no evidence of acute coronary syndrome.  2. Uncontrolled systemic hypertension.  3. Type 2 diabetes mellitus well-controlled.  4. History of schizophrenia on medications in the outpatient setting.   DISCHARGE CONDITION:  Is stable.   DISPOSITION:  Is for home.   DISCHARGE MEDICATIONS:  1. Metformin ER 750 mg daily.  2. Benicar 20 mg daily.  3. Lantus insulin 30 units daily.  4. Naproxen 500 mg b.i.d. p.r.n. for pain.  5. Fluphenazine 10 mg q.h.s.  6. Singulair 10 mg q.h.s.  7. Trazodone 100 mg q.h.s.  8. Fluphenazine injection 25 mg every 2 weeks.  9. Triamcinolone cream  0.1 mg applied b.i.d. p.r.n. for skin rash.  10.Hydroxyzine 25 mg  q.6 h p.r.n. for pruritus.  11.Detrol LA 4 mg once a day.  12.Albuterol nebulizer 2.5 mg q.6 h p.r.n.  13.Multivitamin one a day.  14.Darvocet N 100 one p.o. q.12 p.r.n. for pain.  15.Advair 250/50 1 puff b.i.d.  16.Aspirin 325 mg once a day.  17.Amlodipine 10 mg once a day.   She is to follow up with me in the office in about 2 weeks.  This  discharge has been discussed with her and her questions answered.   DISPOSITION:  [facility].      Fleet Contras, M.D.  Electronically Signed     EA/MEDQ  D:  04/28/2009  T:  04/28/2009  Job:  [id number]

## 2011-03-16 NOTE — Procedures (Signed)
NAME:  [name], Sherri Hays                 ACCOUNT NO.:  [id number]   MEDICAL RECORD NO.:  [id number]          PATIENT TYPE:  OUT   LOCATION:  SLEEP CENTER                 FACILITY[facility]  [facility]   PHYSICIAN:  Clinton D. Maple Hudson, MD, FCCP, FACPDATE OF BIRTH:  May 02[birth year] 1952   DATE OF STUDY:  06/25/2009                            NOCTURNAL POLYSOMNOGRAM   REFERRING PHYSICIAN:  Fleet Contras, M.D.   REFERRING PHYSICIAN:  Fleet Contras, MD   INDICATIONS FOR STUDY:  Hypersomnia with sleep apnea.   EPWORTH SLEEPINESS SCORE:  18/24.   BMI 33.5.  Weight 189 pounds, height 63 inches.  Neck 15 inches.   HOME MEDICATIONS:  Not listed.   SLEEP ARCHITECTURE:  Total sleep time 289 minutes with sleep efficiency  72.6%.  Stage I was 7.8%, stage II 65.2%, stage III absent, REM 27% of  total sleep time.  Sleep latency 33 minutes, REM latency 44.5 minutes,  awake after sleep onset 62.5 minutes, arousal index 32.2.  No bedtime  medication was taken.   RESPIRATORY DATA:  Apnea/hypopnea index (AHI) 9.8 per hour.  A total of  47 events was scored including 2 obstructive apneas, one central apnea  and 44 hypopneas.  Events were noted in all sleep positions, more common  while supine.  REM AHI 20.8 per hour.  An additional 87 events were  noted by the technician as respiratory effort related arousals, not  meeting duration and intensity criteria to be scored as apneas or  hypopneas, but contributing to a cumulative respiratory disturbance  index (RDI) of 27.8 per hour.  There were insufficient early events to  permit CPAP titration by split protocol on the study night.   OXYGEN DATA:  Moderate snoring with oxygen desaturation to a nadir of  83%.  Mean oxygen saturation through the study was 93.4% on room air.   CARDIAC DATA:  Normal sinus rhythm.   MOVEMENT/PARASOMNIA:  No significant movement disturbance.  Bathroom x2.   IMPRESSION/RECOMMENDATIONS:  1. Mild obstructive sleep apnea/hypopnea syndrome,  apnea-hypopnea      index 9.8 per hour, respiratory disturbance index 27.8 per hour.      Events more common while supine and in rapid eye movement (sleep).      Moderately loud snoring with oxygen desaturation to a nadir of 83%      on room air, mean saturation 93.4%.  2. There were insufficient early events to permit continuous positive      airway pressure titration by split protocol on the study night.      Scores in this range can be addressed with continuous positive      airway pressure therapy if      more conservative measures are insufficient.  She can return for      continuous positive airway pressure titration if appropriate.      Otherwise manage as clinically indicated.      Clinton D. Maple Hudson, MD, [facility], FACP  Diplomate, Biomedical engineer of Sleep Medicine  Electronically Signed     CDY/MEDQ  D:  06/28/2009 17:09:26  T:  06/29/2009 02:16:18  Job:  [id number]

## 2011-04-20 ENCOUNTER — Emergency Department (HOSPITAL_COMMUNITY)
Admission: EM | Admit: 2011-04-20 | Discharge: 2011-04-21 | Disposition: A | Discharge status: Home / Self Care | Payer: Medicaid Other | Attending: Emergency Medicine | Admitting: Emergency Medicine

## 2011-04-20 DIAGNOSIS — F209 Schizophrenia, unspecified: Secondary | ICD-10-CM | POA: Insufficient documentation

## 2011-04-20 DIAGNOSIS — A6000 Herpesviral infection of urogenital system, unspecified: Secondary | ICD-10-CM | POA: Insufficient documentation

## 2011-04-20 DIAGNOSIS — I10 Essential (primary) hypertension: Secondary | ICD-10-CM | POA: Insufficient documentation

## 2011-04-20 DIAGNOSIS — E119 Type 2 diabetes mellitus without complications: Secondary | ICD-10-CM | POA: Insufficient documentation

## 2011-04-20 LAB — URINALYSIS, ROUTINE W REFLEX MICROSCOPIC
Glucose, UA: NEGATIVE mg/dL
Leukocytes, UA: NEGATIVE
Nitrite: NEGATIVE
Protein, ur: NEGATIVE mg/dL
pH: 6 (ref 5.0–8.0)

## 2011-04-20 LAB — WET PREP, GENITAL
Clue Cells Wet Prep HPF POC: NONE SEEN
Yeast Wet Prep HPF POC: NONE SEEN

## 2011-05-06 ENCOUNTER — Emergency Department (HOSPITAL_COMMUNITY)
Admission: EM | Admit: 2011-05-06 | Discharge: 2011-05-06 | Disposition: A | Discharge status: Home / Self Care | Payer: Medicaid Other | Attending: Emergency Medicine | Admitting: Emergency Medicine

## 2011-05-06 DIAGNOSIS — L989 Disorder of the skin and subcutaneous tissue, unspecified: Secondary | ICD-10-CM | POA: Insufficient documentation

## 2011-05-06 DIAGNOSIS — E119 Type 2 diabetes mellitus without complications: Secondary | ICD-10-CM | POA: Insufficient documentation

## 2011-05-06 DIAGNOSIS — I10 Essential (primary) hypertension: Secondary | ICD-10-CM | POA: Insufficient documentation

## 2011-05-06 DIAGNOSIS — IMO0001 Reserved for inherently not codable concepts without codable children: Secondary | ICD-10-CM | POA: Insufficient documentation

## 2011-05-17 ENCOUNTER — Emergency Department (HOSPITAL_COMMUNITY)
Admission: EM | Admit: 2011-05-17 | Discharge: 2011-05-18 | Disposition: A | Discharge status: Home / Self Care | Payer: Medicaid Other | Attending: Emergency Medicine | Admitting: Emergency Medicine

## 2011-05-17 DIAGNOSIS — N390 Urinary tract infection, site not specified: Secondary | ICD-10-CM | POA: Insufficient documentation

## 2011-05-17 DIAGNOSIS — I10 Essential (primary) hypertension: Secondary | ICD-10-CM | POA: Insufficient documentation

## 2011-05-17 DIAGNOSIS — E785 Hyperlipidemia, unspecified: Secondary | ICD-10-CM | POA: Insufficient documentation

## 2011-05-17 DIAGNOSIS — J45909 Unspecified asthma, uncomplicated: Secondary | ICD-10-CM | POA: Insufficient documentation

## 2011-05-17 DIAGNOSIS — E119 Type 2 diabetes mellitus without complications: Secondary | ICD-10-CM | POA: Insufficient documentation

## 2011-05-17 DIAGNOSIS — Z794 Long term (current) use of insulin: Secondary | ICD-10-CM | POA: Insufficient documentation

## 2011-05-17 DIAGNOSIS — R109 Unspecified abdominal pain: Secondary | ICD-10-CM | POA: Insufficient documentation

## 2011-05-17 DIAGNOSIS — R197 Diarrhea, unspecified: Secondary | ICD-10-CM | POA: Insufficient documentation

## 2011-05-18 ENCOUNTER — Emergency Department (HOSPITAL_COMMUNITY): Payer: Medicaid Other

## 2011-05-18 LAB — DIFFERENTIAL
Basophils Relative: 0 % (ref 0–1)
Lymphs Abs: 5.1 10*3/uL — ABNORMAL HIGH (ref 0.7–4.0)
Monocytes Absolute: 0.9 10*3/uL (ref 0.1–1.0)
Monocytes Relative: 6 % (ref 3–12)
Neutro Abs: 7.1 10*3/uL (ref 1.7–7.7)

## 2011-05-18 LAB — BASIC METABOLIC PANEL
BUN: 9 mg/dL (ref 6–23)
Calcium: 9.1 mg/dL (ref 8.4–10.5)
Creatinine, Ser: 0.72 mg/dL (ref 0.50–1.10)
GFR calc non Af Amer: >60 mL/min (ref >60)
Glucose, Bld: 91 mg/dL (ref 70–99)
Sodium: 139 mEq/L (ref 135–145)

## 2011-05-18 LAB — URINALYSIS, ROUTINE W REFLEX MICROSCOPIC
Bilirubin Urine: NEGATIVE
Glucose, UA: NEGATIVE mg/dL
Hgb urine dipstick: NEGATIVE
Ketones, ur: NEGATIVE mg/dL
Protein, ur: NEGATIVE mg/dL
Urobilinogen, UA: 0.2 mg/dL (ref 0.0–1.0)

## 2011-05-18 LAB — URINE MICROSCOPIC-ADD ON

## 2011-05-18 LAB — CBC
Hemoglobin: 12.1 g/dL (ref 12.0–15.0)
MCH: 25.9 pg — ABNORMAL LOW (ref 26.0–34.0)
MCHC: 31.3 g/dL (ref 30.0–36.0)
MCV: 82.9 fL (ref 78.0–100.0)

## 2011-05-19 LAB — URINE CULTURE
Colony Count: >=100000
Culture  Setup Time: 201207171204

## 2011-07-05 ENCOUNTER — Emergency Department (HOSPITAL_COMMUNITY)
Admission: EM | Admit: 2011-07-05 | Discharge: 2011-07-06 | Disposition: A | Discharge status: Home / Self Care | Payer: Medicaid Other | Attending: Emergency Medicine | Admitting: Emergency Medicine

## 2011-07-05 DIAGNOSIS — N318 Other neuromuscular dysfunction of bladder: Secondary | ICD-10-CM | POA: Insufficient documentation

## 2011-07-05 DIAGNOSIS — I10 Essential (primary) hypertension: Secondary | ICD-10-CM | POA: Insufficient documentation

## 2011-07-05 DIAGNOSIS — J45909 Unspecified asthma, uncomplicated: Secondary | ICD-10-CM | POA: Insufficient documentation

## 2011-07-05 DIAGNOSIS — E785 Hyperlipidemia, unspecified: Secondary | ICD-10-CM | POA: Insufficient documentation

## 2011-07-05 DIAGNOSIS — F200 Paranoid schizophrenia: Secondary | ICD-10-CM | POA: Insufficient documentation

## 2011-07-05 DIAGNOSIS — R350 Frequency of micturition: Secondary | ICD-10-CM | POA: Insufficient documentation

## 2011-07-05 DIAGNOSIS — E119 Type 2 diabetes mellitus without complications: Secondary | ICD-10-CM | POA: Insufficient documentation

## 2011-07-05 DIAGNOSIS — E669 Obesity, unspecified: Secondary | ICD-10-CM | POA: Insufficient documentation

## 2011-07-05 DIAGNOSIS — Z794 Long term (current) use of insulin: Secondary | ICD-10-CM | POA: Insufficient documentation

## 2011-07-05 LAB — POCT I-STAT, CHEM 8
Chloride: 104 mEq/L (ref 96–112)
Glucose, Bld: 96 mg/dL (ref 70–99)
HCT: 38 % (ref 36.0–46.0)
Hemoglobin: 12.9 g/dL (ref 12.0–15.0)
Potassium: 3.6 mEq/L (ref 3.5–5.1)
Sodium: 142 mEq/L (ref 135–145)

## 2011-07-05 LAB — URINALYSIS, ROUTINE W REFLEX MICROSCOPIC
Leukocytes, UA: NEGATIVE
Nitrite: NEGATIVE
Specific Gravity, Urine: 1.01 (ref 1.005–1.030)
Urobilinogen, UA: 0.2 mg/dL (ref 0.0–1.0)
pH: 6 (ref 5.0–8.0)

## 2011-08-06 LAB — DIFFERENTIAL
Basophils Absolute: 0.1 10*3/uL (ref 0.0–0.1)
Lymphocytes Relative: 27 % (ref 12–46)
Lymphs Abs: 5.4 10*3/uL — ABNORMAL HIGH (ref 0.7–4.0)
Monocytes Absolute: 1 10*3/uL (ref 0.1–1.0)
Neutro Abs: 13.2 10*3/uL — ABNORMAL HIGH (ref 1.7–7.7)

## 2011-08-06 LAB — POCT I-STAT, CHEM 8
BUN: 13 mg/dL (ref 6–23)
Calcium, Ion: 1 mmol/L — ABNORMAL LOW (ref 1.12–1.32)
Chloride: 104 mEq/L (ref 96–112)
Creatinine, Ser: 0.8 mg/dL (ref 0.4–1.2)
Sodium: 138 mEq/L (ref 135–145)

## 2011-08-06 LAB — POCT I-STAT 3, VENOUS BLOOD GAS (G3P V)
Bicarbonate: 31.4 mEq/L — ABNORMAL HIGH (ref 20.0–24.0)
TCO2: 33 mmol/L (ref 0–100)
pCO2, Ven: 54.3 mmHg — ABNORMAL HIGH (ref 45.0–50.0)
pH, Ven: 7.37 — ABNORMAL HIGH (ref 7.250–7.300)

## 2011-08-06 LAB — CBC
Hemoglobin: 12.8 g/dL (ref 12.0–15.0)
RBC: 4.5 MIL/uL (ref 3.87–5.11)
RDW: 16.5 % — ABNORMAL HIGH (ref 11.5–15.5)
WBC: 19.8 10*3/uL — ABNORMAL HIGH (ref 4.0–10.5)

## 2011-08-10 LAB — GC/CHLAMYDIA PROBE AMP, GENITAL
Chlamydia, DNA Probe: NEGATIVE
GC Probe Amp, Genital: NEGATIVE

## 2011-08-10 LAB — DIFFERENTIAL
Basophils Absolute: 0.1
Basophils Relative: 1
Monocytes Absolute: 0.6
Neutro Abs: 5.8
Neutrophils Relative %: 53

## 2011-08-10 LAB — URINALYSIS, ROUTINE W REFLEX MICROSCOPIC
Bilirubin Urine: NEGATIVE
Hgb urine dipstick: NEGATIVE
Ketones, ur: 15 — AB
Leukocytes, UA: NEGATIVE
Nitrite: NEGATIVE
Protein, ur: NEGATIVE
Specific Gravity, Urine: 1.036 — ABNORMAL HIGH
Urobilinogen, UA: 0.2
pH: 5.5
pH: 6

## 2011-08-10 LAB — URINE MICROSCOPIC-ADD ON

## 2011-08-10 LAB — CBC
MCHC: 32.5
RDW: 15.6 — ABNORMAL HIGH

## 2011-08-10 LAB — I-STAT 8, (EC8 V) (CONVERTED LAB)
Acid-Base Excess: 1
Chloride: 102
HCT: 49 — ABNORMAL HIGH
HCT: 48 — ABNORMAL HIGH
Operator id: 234501
Potassium: 3.8
Potassium: 3.6
Sodium: 136
Sodium: 134 — ABNORMAL LOW
TCO2: 27
TCO2: 28
pH, Ven: 7.346 — ABNORMAL HIGH

## 2011-08-10 LAB — POCT I-STAT CREATININE
Creatinine, Ser: 0.8
Operator id: 146091

## 2011-08-10 LAB — WET PREP, GENITAL

## 2011-08-10 LAB — POCT PREGNANCY, URINE
Operator id: 26520
Preg Test, Ur: NEGATIVE

## 2011-08-11 LAB — CBC
Hemoglobin: 12.7
MCHC: 33.2
MCV: 84.6
RBC: 4.5
RDW: 15.8 — ABNORMAL HIGH

## 2011-08-11 LAB — PREGNANCY, URINE: Preg Test, Ur: NEGATIVE

## 2011-08-11 LAB — URINALYSIS, ROUTINE W REFLEX MICROSCOPIC
Bilirubin Urine: NEGATIVE
Hgb urine dipstick: NEGATIVE
Nitrite: NEGATIVE
Urobilinogen, UA: 0.2

## 2011-08-11 LAB — DIFFERENTIAL
Basophils Absolute: 0.2 — ABNORMAL HIGH
Basophils Relative: 2 — ABNORMAL HIGH
Eosinophils Absolute: 0.4
Eosinophils Relative: 4
Monocytes Absolute: 0.8 — ABNORMAL HIGH
Monocytes Relative: 6
Neutro Abs: 7.4

## 2011-08-11 LAB — RAPID URINE DRUG SCREEN, HOSP PERFORMED
Amphetamines: NOT DETECTED
Barbiturates: NOT DETECTED
Benzodiazepines: NOT DETECTED
Cocaine: NOT DETECTED

## 2011-08-11 LAB — BASIC METABOLIC PANEL
CO2: 28
Calcium: 8.8
Chloride: 95 — ABNORMAL LOW
Glucose, Bld: 385 — ABNORMAL HIGH
Sodium: 134 — ABNORMAL LOW

## 2011-08-11 LAB — WET PREP, GENITAL
Clue Cells Wet Prep HPF POC: NONE SEEN
Trich, Wet Prep: NONE SEEN

## 2011-08-11 LAB — RPR: RPR Ser Ql: NONREACTIVE

## 2011-08-12 LAB — URINALYSIS, ROUTINE W REFLEX MICROSCOPIC
Bilirubin Urine: NEGATIVE
Glucose, UA: >1000 — AB
Hgb urine dipstick: NEGATIVE
Ketones, ur: NEGATIVE
Leukocytes, UA: NEGATIVE
Nitrite: NEGATIVE
Protein, ur: NEGATIVE
Specific Gravity, Urine: 1.034 — ABNORMAL HIGH
Urobilinogen, UA: 1
pH: 6

## 2011-08-12 LAB — BASIC METABOLIC PANEL
BUN: 10
CO2: 28
Calcium: 8.8
Chloride: 92 — ABNORMAL LOW
Creatinine, Ser: 0.72
GFR calc Af Amer: >60
GFR calc non Af Amer: >60
Glucose, Bld: 437 — ABNORMAL HIGH
Potassium: 3.5
Sodium: 129 — ABNORMAL LOW

## 2012-01-10 ENCOUNTER — Emergency Department (HOSPITAL_COMMUNITY)
Admission: EM | Admit: 2012-01-10 | Discharge: 2012-01-11 | Disposition: A | Discharge status: Skilled Nursing Facility | Payer: Medicaid Other | Attending: Emergency Medicine | Admitting: Emergency Medicine

## 2012-01-10 ENCOUNTER — Encounter (HOSPITAL_COMMUNITY): Payer: Self-pay | Admitting: *Deleted

## 2012-01-10 ENCOUNTER — Other Ambulatory Visit: Payer: Self-pay

## 2012-01-10 ENCOUNTER — Emergency Department (HOSPITAL_COMMUNITY): Payer: Medicaid Other

## 2012-01-10 DIAGNOSIS — R0602 Shortness of breath: Secondary | ICD-10-CM | POA: Insufficient documentation

## 2012-01-10 DIAGNOSIS — M549 Dorsalgia, unspecified: Secondary | ICD-10-CM | POA: Insufficient documentation

## 2012-01-10 DIAGNOSIS — I10 Essential (primary) hypertension: Secondary | ICD-10-CM | POA: Insufficient documentation

## 2012-01-10 DIAGNOSIS — R0600 Dyspnea, unspecified: Secondary | ICD-10-CM

## 2012-01-10 DIAGNOSIS — Z794 Long term (current) use of insulin: Secondary | ICD-10-CM | POA: Insufficient documentation

## 2012-01-10 DIAGNOSIS — R1013 Epigastric pain: Secondary | ICD-10-CM | POA: Insufficient documentation

## 2012-01-10 DIAGNOSIS — F2089 Other schizophrenia: Secondary | ICD-10-CM | POA: Insufficient documentation

## 2012-01-10 DIAGNOSIS — R0609 Other forms of dyspnea: Secondary | ICD-10-CM | POA: Insufficient documentation

## 2012-01-10 DIAGNOSIS — E119 Type 2 diabetes mellitus without complications: Secondary | ICD-10-CM | POA: Insufficient documentation

## 2012-01-10 DIAGNOSIS — Z8673 Personal history of transient ischemic attack (TIA), and cerebral infarction without residual deficits: Secondary | ICD-10-CM | POA: Insufficient documentation

## 2012-01-10 DIAGNOSIS — R0989 Other specified symptoms and signs involving the circulatory and respiratory systems: Secondary | ICD-10-CM | POA: Insufficient documentation

## 2012-01-10 DIAGNOSIS — R079 Chest pain, unspecified: Secondary | ICD-10-CM | POA: Insufficient documentation

## 2012-01-10 HISTORY — DX: Other schizophrenia: F20.89

## 2012-01-10 HISTORY — DX: Essential (primary) hypertension: I10

## 2012-01-10 HISTORY — DX: Type 2 diabetes mellitus with diabetic neuropathy, unspecified: E11.40

## 2012-01-10 HISTORY — DX: Transient cerebral ischemic attack, unspecified: G45.9

## 2012-01-10 LAB — CBC
HCT: 36.3 % (ref 36.0–46.0)
Hemoglobin: 11.7 g/dL — ABNORMAL LOW (ref 12.0–15.0)
MCH: 27.5 pg (ref 26.0–34.0)
MCHC: 32.2 g/dL (ref 30.0–36.0)
RBC: 4.26 MIL/uL (ref 3.87–5.11)

## 2012-01-10 LAB — DIFFERENTIAL
Basophils Relative: 1 % (ref 0–1)
Eosinophils Absolute: 0.6 10*3/uL (ref 0.0–0.7)
Lymphs Abs: 5.8 10*3/uL — ABNORMAL HIGH (ref 0.7–4.0)
Monocytes Absolute: 1 10*3/uL (ref 0.1–1.0)
Monocytes Relative: 7 % (ref 3–12)
Neutro Abs: 7.8 10*3/uL — ABNORMAL HIGH (ref 1.7–7.7)
Neutrophils Relative %: 51 % (ref 43–77)

## 2012-01-10 LAB — POCT I-STAT TROPONIN I: Troponin i, poc: 0.02 ng/mL (ref 0.00–0.08)

## 2012-01-10 MED ORDER — GI COCKTAIL ~~LOC~~
30.0000 mL | Freq: Once | ORAL | Status: AC
  Administered 2012-01-11: 30 mL via ORAL
  Filled 2012-01-10: qty 30

## 2012-01-10 MED ORDER — SODIUM CHLORIDE 0.9 % IV SOLN
Freq: Once | INTRAVENOUS | Status: AC
  Administered 2012-01-11: 125 mL/h via INTRAVENOUS

## 2012-01-10 MED ORDER — FAMOTIDINE 20 MG PO TABS
40.0000 mg | ORAL_TABLET | Freq: Once | ORAL | Status: AC
  Administered 2012-01-11: 40 mg via ORAL
  Filled 2012-01-10: qty 2

## 2012-01-10 MED ORDER — SUCRALFATE 1 G PO TABS
1.0000 g | ORAL_TABLET | ORAL | Status: AC
  Administered 2012-01-11: 1 g via ORAL
  Filled 2012-01-10: qty 1

## 2012-01-10 NOTE — ED Provider Notes (Signed)
History     CSN: [id number]  Arrival date & time 01/10/12  2246   First MD Initiated Contact with Patient 01/10/12 2304      Chief Complaint  Patient presents with  . Chest Pain  . Shortness of Breath    (Consider location/radiation/quality/duration/timing/severity/associated sxs/prior treatment) Patient is a 61 y.o. female presenting with chest pain and shortness of breath. The history is provided by the patient.  Chest Pain Primary symptoms include shortness of breath.    Shortness of Breath  Associated symptoms include chest pain and shortness of breath.   patient here with abdominal pain which has been persistent for 6 weeks. Pain is epigastric and radiating to her back and is worse after she eats. She is status post cholecystectomy. Associated symptoms are vomiting or diarrhea. Nonproductive cough noted. Denies anys exertional component to this. No medications used prior to arrival for this. No prior treatment for this it denies any change in her bowel habits. No dyspnea or diaphoresis noted  Past Medical History  Diagnosis Date  . Hypertension   . Diabetes mellitus   . Dysphagia   . TIA (transient ischemic attack)   . Genital herpes   . DM neuropathies   . Schizophrenia, simple     No past surgical history on file.  No family history on file.  History  Substance Use Topics  . Smoking status: Not on file  . Smokeless tobacco: Not on file  . Alcohol Use:     OB History    Grav Para Term Preterm Abortions TAB SAB Ect Mult Living                  Review of Systems  Respiratory: Positive for shortness of breath.   Cardiovascular: Positive for chest pain.  All other systems reviewed and are negative.    Allergies  Penicillins  Home Medications   Current Outpatient Rx  Name Route Sig Dispense Refill  . ACETAMINOPHEN 500 MG PO TABS Oral Take 1,000 mg by mouth every 6 (six) hours as needed. For pain    . ALBUTEROL SULFATE HFA 108 (90 BASE) MCG/ACT IN  AERS Inhalation Inhale 2 puffs into the lungs every 6 (six) hours as needed. For dyspnea    . ASPIRIN 325 MG PO TABS Oral Take 325 mg by mouth daily.    . ATORVASTATIN CALCIUM 10 MG PO TABS Oral Take 10 mg by mouth at bedtime.    Marland Kitchen DICLOFENAC SODIUM 75 MG PO TBEC Oral Take 75 mg by mouth 2 (two) times daily.    Marland Kitchen FLUPHENAZINE HCL 10 MG PO TABS Oral Take 10 mg by mouth at bedtime.    Marland Kitchen FLUPHENAZINE DECANOATE 25 MG/ML IJ SOLN Intramuscular Inject 25 mg into the muscle every 14 (fourteen) days. Injection given at [facility]    . GABAPENTIN 300 MG PO CAPS Oral Take 300 mg by mouth 3 (three) times daily.    . GUAIFENESIN 100 MG/5ML PO LIQD Oral Take 200 mg by mouth every 4 (four) hours as needed. For cough    . HYDROXYZINE PAMOATE 100 MG PO CAPS Oral Take 100 mg by mouth every 4 (four) hours as needed. For itching    . INSULIN GLARGINE 100 UNIT/ML [place] SOLN Subcutaneous Inject 30 Units into the skin at bedtime.    Marland Kitchen LISINOPRIL 2.5 MG PO TABS Oral Take 2.5 mg by mouth daily.    Marland Kitchen LOPERAMIDE HCL 2 MG PO CAPS Oral Take 2 mg by mouth 4 (  four) times daily as needed. For diarrhea    . MELATONIN 1 MG PO TABS Oral Take 1 tablet by mouth at bedtime.    Marland Kitchen METFORMIN HCL 500 MG PO TABS Oral Take 500-1,000 mg by mouth every evening. 1000 mg every morning and 500 mg every evening at 5:00 PM    . METOPROLOL SUCCINATE ER 25 MG PO TB24 Oral Take 25 mg by mouth every evening.    . ADULT MULTIVITAMIN W/MINERALS CH Oral Take 1 tablet by mouth daily.    Marland Kitchen OMEPRAZOLE 20 MG PO CPDR Oral Take 20 mg by mouth daily with breakfast.    . SOLIFENACIN SUCCINATE 10 MG PO TABS Oral Take 5 mg by mouth daily.    . TRIHEXYPHENIDYL HCL 2 MG PO TABS Oral Take 2 mg by mouth at bedtime.      BP 147/55  Pulse 80  Temp(Src) 98.6 F (37 C) (Oral)  Resp 24  SpO2 95%  Physical Exam  Nursing note and vitals reviewed. Constitutional: She is oriented to person, place, and time. She appears well-developed and well-nourished.  Non-toxic  appearance. No distress.  HENT:  Head: Normocephalic and atraumatic.  Eyes: Conjunctivae, EOM and lids are normal. Pupils are equal, round, and reactive to light.  Neck: Normal range of motion. Neck supple. No tracheal deviation present. No mass present.  Cardiovascular: Normal rate, regular rhythm and normal heart sounds.  Exam reveals no gallop.   No murmur heard. Pulmonary/Chest: Effort normal. No stridor. No respiratory distress. She has no decreased breath sounds. She has no wheezes. She has rhonchi. She has no rales.  Abdominal: Soft. Normal appearance and bowel sounds are normal. She exhibits no distension. There is tenderness in the epigastric area. There is no rebound and no CVA tenderness.  Musculoskeletal: Normal range of motion. She exhibits no edema and no tenderness.       1+ bilateral lower extremity pitting edema  Neurological: She is alert and oriented to person, place, and time. She has normal strength. No cranial nerve deficit or sensory deficit. GCS eye subscore is 4. GCS verbal subscore is 5. GCS motor subscore is 6.  Skin: Skin is warm and dry. No abrasion and no rash noted.  Psychiatric: She has a normal mood and affect. Her speech is normal and behavior is normal.    ED Course  Procedures (including critical care time)  Labs Reviewed  CBC - Abnormal; Notable for the following:    WBC 15.2 (*)    Hemoglobin 11.7 (*)    RDW 16.5 (*)    All other components within normal limits  DIFFERENTIAL - Abnormal; Notable for the following:    Neutro Abs 7.8 (*)    Lymphs Abs 5.8 (*)    All other components within normal limits  POCT I-STAT TROPONIN I  COMPREHENSIVE METABOLIC PANEL  LIPASE, BLOOD   No results found.   No diagnosis found.    MDM  Patient given pain medications and does well better at this time. Patient is a difficult historian and seems to have complaints of abdominal pain as well as pleuritic chest pain now. Abdominal CT was negative for acute  intra-abdominal process. Due to her non-ambulatory state, she will have a chest CT to rule out pulmonary embolism`    7:35 AM Patient had V/Q scan which inconclusive but likely low-probabability per radiology ( pt didn't inhale). Patient will have a d-dimer here. If both of which are negative then patient be discharged home, but if  her d-dimer is positive then patient will have a chest CT later this morning.  Pt continues to state that pain is worse with movement and she also uses home oxygen. Dr.ghim    Toy Baker, MD 01/11/12 [phone]

## 2012-01-10 NOTE — ED Notes (Signed)
Pt alert and oriented.  Pain increases on palpation.  She states she has been coughing for 6 weeks. Lung sounds with rhonci throughout.  BIL non-pitting 2+ edema.

## 2012-01-10 NOTE — ED Notes (Signed)
EMS was called to [facility] b/c pt was c/o central chest pain (from her back radiating to chest) and sob that started around 1945, right after she ate dinner.  Sats of 98 on RA.  Pain increased on palpation.  Pt given 3 sl nitro with no relief and has had 324 mg non-chewable asa from staff.  Pt ao x 4.

## 2012-01-10 NOTE — ED Notes (Signed)
Patient transported to X-ray 

## 2012-01-11 ENCOUNTER — Emergency Department (HOSPITAL_COMMUNITY): Payer: Medicaid Other

## 2012-01-11 ENCOUNTER — Encounter (HOSPITAL_COMMUNITY): Payer: Self-pay | Admitting: Cardiology

## 2012-01-11 LAB — COMPREHENSIVE METABOLIC PANEL
ALT: 16 U/L (ref 0–35)
BUN: 10 mg/dL (ref 6–23)
Chloride: 102 mEq/L (ref 96–112)
Creatinine, Ser: 0.52 mg/dL (ref 0.50–1.10)
GFR calc non Af Amer: >90 mL/min (ref >90)
Glucose, Bld: 112 mg/dL — ABNORMAL HIGH (ref 70–99)
Potassium: 4.4 mEq/L (ref 3.5–5.1)

## 2012-01-11 LAB — D-DIMER, QUANTITATIVE: D-Dimer, Quant: 0.39 ug/mL-FEU (ref 0.00–0.48)

## 2012-01-11 LAB — PRO B NATRIURETIC PEPTIDE: Pro B Natriuretic peptide (BNP): 581.7 pg/mL — ABNORMAL HIGH (ref 0–125)

## 2012-01-11 MED ORDER — LORAZEPAM 2 MG/ML IJ SOLN
1.0000 mg | Freq: Once | INTRAMUSCULAR | Status: AC
  Administered 2012-01-11: 1 mg via INTRAVENOUS
  Filled 2012-01-11: qty 1

## 2012-01-11 MED ORDER — XENON XE 133 GAS
10.0000 | GAS_FOR_INHALATION | Freq: Once | RESPIRATORY_TRACT | Status: AC | PRN
  Administered 2012-01-11: 10 via RESPIRATORY_TRACT

## 2012-01-11 MED ORDER — TECHNETIUM TO 99M ALBUMIN AGGREGATED
3.0000 | Freq: Once | INTRAVENOUS | Status: AC | PRN
  Administered 2012-01-11: 3 via INTRAVENOUS

## 2012-01-11 MED ORDER — IOHEXOL 300 MG/ML  SOLN
20.0000 mL | INTRAMUSCULAR | Status: AC
  Administered 2012-01-11: 20 mL via ORAL

## 2012-01-11 MED ORDER — IOHEXOL 300 MG/ML  SOLN
100.0000 mL | Freq: Once | INTRAMUSCULAR | Status: AC | PRN
  Administered 2012-01-11: 100 mL via INTRAVENOUS

## 2012-01-11 MED ORDER — MORPHINE SULFATE 2 MG/ML IJ SOLN
2.0000 mg | Freq: Once | INTRAMUSCULAR | Status: AC
  Administered 2012-01-11: 2 mg via INTRAVENOUS
  Filled 2012-01-11: qty 1

## 2012-01-11 MED ORDER — STARCH (THICKENING) PO POWD
ORAL | Status: DC | PRN
  Filled 2012-01-11: qty 227

## 2012-01-11 NOTE — ED Notes (Signed)
Pt requesting pain meds.  Dr Freida Busman notified but he feels pt is too sleepy to receive more pain meds at this time.

## 2012-01-11 NOTE — ED Notes (Signed)
Pt c/o RLQ pain.  Requesting morphine.  Pt states that often, when she turns her head, her stomach hurts.  When asked about chest pain and shortness of breath, pt states that nothing has changed.

## 2012-01-11 NOTE — ED Notes (Signed)
Pt to VQ scan.

## 2012-01-11 NOTE — ED Notes (Signed)
CT called VQ scan and they are coming in.  Pt sleeping.

## 2012-01-11 NOTE — ED Notes (Signed)
Lab at the bedside 

## 2012-01-11 NOTE — ED Notes (Signed)
Pt states no relief from pain.  She is also c/o diffuse abd pain now.

## 2012-01-22 ENCOUNTER — Emergency Department (HOSPITAL_COMMUNITY): Payer: Medicaid Other

## 2012-01-22 ENCOUNTER — Other Ambulatory Visit: Payer: Self-pay

## 2012-01-22 ENCOUNTER — Encounter (HOSPITAL_COMMUNITY): Payer: Self-pay

## 2012-01-22 ENCOUNTER — Emergency Department (HOSPITAL_COMMUNITY)
Admission: EM | Admit: 2012-01-22 | Discharge: 2012-01-23 | Disposition: A | Discharge status: Other Acute Inpatient Hospital | Payer: Medicaid Other | Attending: Emergency Medicine | Admitting: Emergency Medicine

## 2012-01-22 DIAGNOSIS — Z8679 Personal history of other diseases of the circulatory system: Secondary | ICD-10-CM | POA: Insufficient documentation

## 2012-01-22 DIAGNOSIS — M791 Myalgia, unspecified site: Secondary | ICD-10-CM

## 2012-01-22 DIAGNOSIS — IMO0001 Reserved for inherently not codable concepts without codable children: Secondary | ICD-10-CM | POA: Insufficient documentation

## 2012-01-22 DIAGNOSIS — I10 Essential (primary) hypertension: Secondary | ICD-10-CM | POA: Insufficient documentation

## 2012-01-22 DIAGNOSIS — F2089 Other schizophrenia: Secondary | ICD-10-CM | POA: Insufficient documentation

## 2012-01-22 DIAGNOSIS — Z79899 Other long term (current) drug therapy: Secondary | ICD-10-CM | POA: Insufficient documentation

## 2012-01-22 DIAGNOSIS — M25519 Pain in unspecified shoulder: Secondary | ICD-10-CM | POA: Insufficient documentation

## 2012-01-22 DIAGNOSIS — M549 Dorsalgia, unspecified: Secondary | ICD-10-CM | POA: Insufficient documentation

## 2012-01-22 DIAGNOSIS — M79609 Pain in unspecified limb: Secondary | ICD-10-CM | POA: Insufficient documentation

## 2012-01-22 DIAGNOSIS — R928 Other abnormal and inconclusive findings on diagnostic imaging of breast: Secondary | ICD-10-CM | POA: Insufficient documentation

## 2012-01-22 DIAGNOSIS — R079 Chest pain, unspecified: Secondary | ICD-10-CM | POA: Insufficient documentation

## 2012-01-22 DIAGNOSIS — E119 Type 2 diabetes mellitus without complications: Secondary | ICD-10-CM | POA: Insufficient documentation

## 2012-01-22 DIAGNOSIS — N63 Unspecified lump in unspecified breast: Secondary | ICD-10-CM

## 2012-01-22 DIAGNOSIS — M7989 Other specified soft tissue disorders: Secondary | ICD-10-CM | POA: Insufficient documentation

## 2012-01-22 DIAGNOSIS — R0602 Shortness of breath: Secondary | ICD-10-CM | POA: Insufficient documentation

## 2012-01-22 MED ORDER — HYDROCODONE-ACETAMINOPHEN 5-325 MG PO TABS
2.0000 | ORAL_TABLET | Freq: Once | ORAL | Status: AC
  Administered 2012-01-23: 2 via ORAL
  Filled 2012-01-22: qty 2

## 2012-01-22 NOTE — ED Notes (Signed)
ZOX:[id number]<AV> Expected date:<BR> Expected time: 7:33 PM<BR> Means of arrival:Ambulance<BR> Comments:<BR> M90 -- Leg and Arm Pain

## 2012-01-22 NOTE — ED Notes (Signed)
Per [facility]- pt presents with no acuate distress- denies fall- rt calf pain and rt arm pain- positive homans sign

## 2012-01-22 NOTE — ED Provider Notes (Signed)
History     CSN: [id number]  Arrival date & time 01/22/12  8416   First MD Initiated Contact with Patient 01/22/12 2255      Chief Complaint  Patient presents with  . Leg Pain  . Arm Pain    (Consider location/radiation/quality/duration/timing/severity/associated sxs/prior treatment) HPI Comments: Patient presents with one week of right upper arm pain and right lower leg pain. She denies any history of trauma. The pain has been constant progressively worse to the point where she is unable to sleep tonight. She denies any weakness, numbness, tingling. The pain is in her right upper cavity it radiates in her right shoulder and right chest. She was seen in ED 2 weeks ago for chronic epigastric pain. She also complains of right lower leg pain in the calf area extending up into her thigh. She endorses subjective shortness of breath denies any cough or fever. She's no history of blood clots. She had a VQ scan that was inconclusive 2 weeks ago. No fever, overlying skin change.  The history is provided by the patient.    Past Medical History  Diagnosis Date  . Hypertension   . Diabetes mellitus   . Dysphagia   . TIA (transient ischemic attack)   . Genital herpes   . DM neuropathies   . Schizophrenia, simple     History reviewed. No pertinent past surgical history.  No family history on file.  History  Substance Use Topics  . Smoking status: Not on file  . Smokeless tobacco: Not on file  . Alcohol Use:     OB History    Grav Para Term Preterm Abortions TAB SAB Ect Mult Living                  Review of Systems  Constitutional: Negative for fever, activity change and appetite change.  HENT: Negative for congestion and rhinorrhea.   Respiratory: Positive for cough, chest tightness and shortness of breath.   Cardiovascular: Positive for chest pain.  Gastrointestinal: Negative for nausea, vomiting and abdominal pain.  Genitourinary: Negative for dysuria and hematuria.    Musculoskeletal: Positive for myalgias, joint swelling and arthralgias.  Skin: Negative for rash.  Neurological: Negative for headaches.    Allergies  Penicillins  Home Medications   Current Outpatient Rx  Name Route Sig Dispense Refill  . ACETAMINOPHEN 500 MG PO TABS Oral Take 1,000 mg by mouth every 6 (six) hours as needed. For pain    . ALBUTEROL SULFATE HFA 108 (90 BASE) MCG/ACT IN AERS Inhalation Inhale 2 puffs into the lungs every 6 (six) hours as needed. For dyspnea    . ASPIRIN EC 81 MG PO TBEC Oral Take 81 mg by mouth daily.    . ATORVASTATIN CALCIUM 10 MG PO TABS Oral Take 10 mg by mouth at bedtime.    Marland Kitchen FLUPHENAZINE HCL 10 MG PO TABS Oral Take 10 mg by mouth at bedtime.    Marland Kitchen FLUPHENAZINE DECANOATE 25 MG/ML IJ SOLN Intramuscular Inject 25 mg into the muscle every 14 (fourteen) days. Injection given at [facility]    . GABAPENTIN 300 MG PO CAPS Oral Take 300 mg by mouth 3 (three) times daily.    . GUAIFENESIN 100 MG/5ML PO LIQD Oral Take 200 mg by mouth every 4 (four) hours as needed. For cough    . INSULIN GLARGINE 100 UNIT/ML [place] SOLN Subcutaneous Inject 30 Units into the skin at bedtime.    Marland Kitchen LISINOPRIL 2.5 MG PO TABS Oral Take  2.5 mg by mouth daily.    Marland Kitchen MELATONIN 1 MG PO TABS Oral Take 1 tablet by mouth at bedtime.    Marland Kitchen METFORMIN HCL 500 MG PO TABS Oral Take 500-1,000 mg by mouth See admin instructions. Take 1 tab in the morning and 2 tabs in the evening.    Marland Kitchen METOPROLOL SUCCINATE ER 25 MG PO TB24 Oral Take 25 mg by mouth every evening.    . ADULT MULTIVITAMIN W/MINERALS CH Oral Take 1 tablet by mouth daily.    Marland Kitchen OMEPRAZOLE 20 MG PO CPDR Oral Take 20 mg by mouth daily with breakfast.    . TRIHEXYPHENIDYL HCL 2 MG PO TABS Oral Take 1 mg by mouth at bedtime.     Marland Kitchen LOPERAMIDE HCL 2 MG PO CAPS Oral Take 2 mg by mouth 4 (four) times daily as needed. For diarrhea      BP 169/67  Pulse 89  Temp(Src) 98.6 F (37 C) (Oral)  Resp 20  Ht 5\' 3"  (1.6 m)  Wt 216 lb (97.977  kg)  BMI 38.26 kg/m2  SpO2 95%  Physical Exam  Constitutional: She is oriented to person, place, and time. She appears well-developed and well-nourished. No distress.  HENT:  Head: Normocephalic and atraumatic.  Mouth/Throat: Oropharynx is clear and moist. No oropharyngeal exudate.  Eyes: Conjunctivae and EOM are normal. Pupils are equal, round, and reactive to light.  Neck: Normal range of motion.  Cardiovascular: Normal rate, regular rhythm and normal heart sounds.   No murmur heard. Pulmonary/Chest: Effort normal and breath sounds normal. No respiratory distress.  Abdominal: Soft. There is no tenderness. There is no rebound and no guarding.  Musculoskeletal: Normal range of motion. She exhibits tenderness.       Tenderness to palpation right upper arm without overlying skin change. Full range of motion of the elbow and shoulder without joint effusion. +2 radial pulse, cardinal hand movements intact  Tenderness palpation of the right calf and thigh posteriorly. No palpable cords, no asymmetry, +2 DP and PT pulses, 5 out of 5 strength in the lower extremity  Neurological: She is alert and oriented to person, place, and time. No cranial nerve deficit.  Skin: Skin is warm.    ED Course  Procedures (including critical care time)  Labs Reviewed  CBC - Abnormal; Notable for the following:    WBC 14.4 (*)    RDW 16.1 (*)    Platelets 437 (*)    All other components within normal limits  DIFFERENTIAL - Abnormal; Notable for the following:    Lymphs Abs 5.6 (*)    All other components within normal limits  COMPREHENSIVE METABOLIC PANEL - Abnormal; Notable for the following:    Albumin 3.2 (*)    Total Bilirubin 0.1 (*)    All other components within normal limits  D-DIMER, QUANTITATIVE - Abnormal; Notable for the following:    D-Dimer, Quant 0.49 (*)    All other components within normal limits  PROTIME-INR  URINALYSIS, ROUTINE W REFLEX MICROSCOPIC  TROPONIN I  CK  TROPONIN I    Dg Chest 2 View  01/22/2012  *RADIOLOGY REPORT*  Clinical Data: Leg and arm pain, shortness of breath, cough, hypertension, diabetes, history TIA, schizophrenia  CHEST - 2 VIEW  Comparison: 01/10/2012  Findings: Enlargement of cardiac silhouette. Slight rotation to the right. Mediastinal contours and pulmonary vascularity normal. Lungs clear. No pleural effusion or pneumothorax. Bones unremarkable.  IMPRESSION: Minimal enlargement of cardiac silhouette. No acute abnormalities.  Original Report  Authenticated By: Lollie Marrow, M.D.   Ct Angio Chest W/cm &/or Wo Cm  01/23/2012  *RADIOLOGY REPORT*  Clinical Data: Shortness of breath, chest pain, back pain, question pulmonary embolism, history diabetes, hypertension  CT ANGIOGRAPHY CHEST  Technique:  Multidetector CT imaging of the chest using the standard protocol during bolus administration of intravenous contrast. Multiplanar reconstructed images including MIPs were obtained and reviewed to evaluate the vascular anatomy.  Contrast:  80 ml Omnipaque 300 IV.  Comparison: 06/24/2010  Findings: Scattered atherosclerotic calcifications aorta and coronary arteries. No aortic aneurysm or dissection. No thoracic adenopathy. New 12 x 8 mm left breast nodule image 38. Visualized portions of liver and spleen normal appearance. Pulmonary arteries patent. No evidence of pulmonary embolism. Minimal dependent atelectasis right lung. Mild scattered peribronchial thickening. No infiltrate, pleural effusion or pneumothorax. Levoconvex thoracic scoliosis. Left ventricular wall thickening.  IMPRESSION: No evidence of pulmonary embolism. Left ventricular hypertrophy. Minimal atelectasis and bronchitic changes right lung. New 12 x 8 mm diameter left breast nodule, recommend correlation with physical exam and mammography to evaluate.  Original Report Authenticated By: Lollie Marrow, M.D.     1. Myalgia       MDM  One week of atraumatic right arm and right leg pain.  Associated with subjective chest pain or shortness of breath. No distress. Lungs clear. No overlying skin change, trauma. No signs of arterial insufficiency.  Leukocytosis stable from previous. CK within normal limits, troponin negative, d-dimer elevated. No evidence of pulmonary embolism on CT. Clinically low suspicion for DVT blood patient return for ultrasound of her leg tomorrow.    Date: 01/22/2012  Rate: 81  Rhythm: normal sinus rhythm  QRS Axis: normal  Intervals: normal  ST/T Wave abnormalities: nonspecific ST/T changes  Conduction Disutrbances:none  Narrative Interpretation:   Old EKG Reviewed: unchanged      Glynn Octave, MD 01/23/12 [phone]

## 2012-01-23 ENCOUNTER — Emergency Department (HOSPITAL_COMMUNITY): Payer: Medicaid Other

## 2012-01-23 DIAGNOSIS — M79609 Pain in unspecified limb: Secondary | ICD-10-CM

## 2012-01-23 LAB — COMPREHENSIVE METABOLIC PANEL
BUN: 11 mg/dL (ref 6–23)
CO2: 30 mEq/L (ref 19–32)
Calcium: 8.8 mg/dL (ref 8.4–10.5)
Creatinine, Ser: 0.67 mg/dL (ref 0.50–1.10)
GFR calc Af Amer: >90 mL/min (ref >90)
GFR calc non Af Amer: >90 mL/min (ref >90)
Glucose, Bld: 92 mg/dL (ref 70–99)
Sodium: 139 mEq/L (ref 135–145)
Total Protein: 7.1 g/dL (ref 6.0–8.3)

## 2012-01-23 LAB — URINALYSIS, ROUTINE W REFLEX MICROSCOPIC
Bilirubin Urine: NEGATIVE
Leukocytes, UA: NEGATIVE
Nitrite: NEGATIVE
Specific Gravity, Urine: 1.014 (ref 1.005–1.030)
Urobilinogen, UA: 1 mg/dL (ref 0.0–1.0)
pH: 6 (ref 5.0–8.0)

## 2012-01-23 LAB — DIFFERENTIAL
Basophils Absolute: 0.1 10*3/uL (ref 0.0–0.1)
Basophils Relative: 0 % (ref 0–1)
Eosinophils Absolute: 0.6 10*3/uL (ref 0.0–0.7)
Monocytes Absolute: 0.9 10*3/uL (ref 0.1–1.0)
Monocytes Relative: 6 % (ref 3–12)
Neutro Abs: 7.3 10*3/uL (ref 1.7–7.7)

## 2012-01-23 LAB — D-DIMER, QUANTITATIVE: D-Dimer, Quant: 0.49 ug/mL-FEU — ABNORMAL HIGH (ref 0.00–0.48)

## 2012-01-23 LAB — CBC
HCT: 37.5 % (ref 36.0–46.0)
Hemoglobin: 12 g/dL (ref 12.0–15.0)
MCH: 27.4 pg (ref 26.0–34.0)
MCHC: 32 g/dL (ref 30.0–36.0)
RDW: 16.1 % — ABNORMAL HIGH (ref 11.5–15.5)

## 2012-01-23 LAB — TROPONIN I: Troponin I: <0.3 ng/mL (ref <0.30)

## 2012-01-23 MED ORDER — IOHEXOL 300 MG/ML  SOLN
80.0000 mL | Freq: Once | INTRAMUSCULAR | Status: AC | PRN
  Administered 2012-01-23: 80 mL via INTRAVENOUS

## 2012-01-23 MED ORDER — ENOXAPARIN SODIUM 100 MG/ML ~~LOC~~ SOLN
100.0000 mg | Freq: Once | SUBCUTANEOUS | Status: AC
  Administered 2012-01-23: 100 mg via SUBCUTANEOUS
  Filled 2012-01-23: qty 1

## 2012-01-23 MED ORDER — METOPROLOL TARTRATE 25 MG PO TABS
25.0000 mg | ORAL_TABLET | Freq: Once | ORAL | Status: AC
  Administered 2012-01-23: 25 mg via ORAL
  Filled 2012-01-23: qty 1

## 2012-01-23 MED ORDER — LISINOPRIL 2.5 MG PO TABS
2.5000 mg | ORAL_TABLET | Freq: Once | ORAL | Status: AC
  Administered 2012-01-23: 2.5 mg via ORAL
  Filled 2012-01-23: qty 1

## 2012-01-23 NOTE — ED Provider Notes (Addendum)
BP 203/78  Pulse 81  Temp(Src) 98.2 F (36.8 C) (Oral)  Resp 20  Ht 5\' 3"  (1.6 m)  Wt 216 lb (97.977 kg)  BMI 38.26 kg/m2  SpO2 93%  Patient signed out from Dr. Manus Gunning. VUS b/l LE pending. Given lovenox previously. Vague, atypical complaints. BP elevated but pt not c/o cp/sob/difficulty urinating. Cr unremarkable. Will order home antihypertensives.   BP 196/93  Pulse 90  Temp(Src) 98.9 F (37.2 C) (Oral)  Resp 18  Ht 5\' 3"  (1.6 m)  Wt 216 lb (97.977 kg)  BMI 38.26 kg/m2  SpO2 95%  VUS negative (pre-lim) discharge to home. Needs f/u breast nodule.   Forbes Cellar, MD 01/23/12 1119  Forbes Cellar, MD 01/23/12 1247

## 2012-01-23 NOTE — Discharge Instructions (Signed)
Your electrolytes today are normal. Your heart test look normal. There is no blood clot in her along. Return tomorrow for an ultrasound of your arm and leg to rule out blood clot. Return to the ED to open or worsening symptoms. Her CT scan today showed a nodule in your left breast which should be evaluated with mammography  Myalgia, Adult Myalgia is the medical term for muscle pain. It is a symptom of many things. Nearly everyone at some time in their life has this. The most common cause for muscle pain is overuse or straining and more so when you are not in shape. Injuries and muscle bruises cause myalgias. Muscle pain without a history of injury can also be caused by a virus. It frequently comes along with the flu. Myalgia not caused by muscle strain can be present in a large number of infectious diseases. Some autoimmune diseases like lupus and fibromyalgia can cause muscle pain. Myalgia may be mild, or severe. SYMPTOMS  The symptoms of myalgia are simply muscle pain. Most of the time this is short lived and the pain goes away without treatment. DIAGNOSIS  Myalgia is diagnosed by your caregiver by taking your history. This means you tell him when the problems began, what they are, and what has been happening. If this has not been a long term problem, your caregiver may want to watch for a while to see what will happen. If it has been long term, they may want to do additional testing. TREATMENT  The treatment depends on what the underlying cause of the muscle pain is. Often anti-inflammatory medications will help. HOME CARE INSTRUCTIONS  If the pain in your muscles came from overuse, slow down your activities until the problems go away.   Myalgia from overuse of a muscle can be treated with alternating hot and cold packs on the muscle affected or with cold for the first couple days. If either heat or cold seems to make things worse, stop their use.   Apply ice to the sore area for 15 to 20 minutes,  3 to 4 times per day, while awake for the first 2 days of muscle soreness, or as directed. Put the ice in a plastic bag and place a towel between the bag of ice and your skin.   Only take over-the-counter or prescription medicines for pain, discomfort, or fever as directed by your caregiver.   Regular gentle exercise may help if you are not active.   Stretching before strenuous exercise can help lower the risk of myalgia. It is normal when beginning an exercise regimen to feel some muscle pain after exercising. Muscles that have not been used frequently will be sore at first. If the pain is extreme, this may mean injury to a muscle.  SEEK MEDICAL CARE IF:  You have an increase in muscle pain that is not relieved with medication.   You begin to run a temperature.   You develop nausea and vomiting.   You develop a stiff and painful neck.   You develop a rash.   You develop muscle pain after a tick bite.   You have continued muscle pain while working out even after you are in good condition.  SEEK IMMEDIATE MEDICAL CARE IF: Any of your problems are getting worse and medications are not helping. MAKE SURE YOU:   Understand these instructions.   Will watch your condition.   Will get help right away if you are not doing well or get worse.  Document Released: 09/09/2006 Document Revised: 10/07/2011 Document Reviewed: 11/29/2006 [facility] Patient Information 2012 [place], [place].  Mammogram A mammogram is an X-ray test to find changes in a woman's breast. You should get a mammogram if:  You are over 85 years of age.   You have risk factors.   Your doctor recommends that you have one.  BEFORE THE TEST  Do not schedule the test the week before your period, especially if your breasts are sore during this time.  On the day of your mammogram:  Wash your breasts and armpits well. After washing, do not put on any deodorant or talcum powder on until after your test.   Eat and drink as  you usually do.   Take your medicines as usual.   If you are diabetic and take insulin, make sure you:   Eat before coming for your test.   Take your insulin as usual.   If you cannot keep your appointment, call before the appointment to cancel. Schedule another appointment.  TEST  You will need to undress from the waist up. You will put on a hospital gown.   Your breast will be put on the mammogram machine, and it will press firmly on your breast with a piece of plastic called a compression paddle. This will make your breast flatter so that the machine can X-ray all parts of your breast.   Both breasts will be X-rayed. Each breast will be X-rayed from above and from the side. An X-ray might need to be taken again if the picture is not good enough.   The mammogram will last about 15 to 30 minutes.  AFTER THE TEST Finding out the results of your test Ask when your test results will be ready. Make sure you get your test results. Document Released: 01/14/2009 Document Revised: 10/07/2011 Document Reviewed: 01/14/2009 [facility] Patient Information 2012 [place], [place].

## 2012-01-23 NOTE — Progress Notes (Signed)
VASCULAR LAB PRELIMINARY  PRELIMINARY  PRELIMINARY  PRELIMINARY  Right upper and lower extremity venous Dopplers completed.    Preliminary report:  There is no DVT or SVT noted in the right upper or lower extremity.  Sherren Kerns [place], 01/23/2012, 9:24 AM

## 2012-01-25 ENCOUNTER — Other Ambulatory Visit: Payer: Self-pay | Admitting: Geriatric Medicine

## 2012-01-25 DIAGNOSIS — N6320 Unspecified lump in the left breast, unspecified quadrant: Secondary | ICD-10-CM

## 2012-02-02 ENCOUNTER — Ambulatory Visit
Admission: RE | Admit: 2012-02-02 | Discharge: 2012-02-02 | Disposition: A | Discharge status: Home / Self Care | Payer: Medicaid Other | Source: Ambulatory Visit | Attending: Geriatric Medicine | Admitting: Geriatric Medicine

## 2012-02-02 ENCOUNTER — Other Ambulatory Visit: Payer: Self-pay | Admitting: Geriatric Medicine

## 2012-02-02 DIAGNOSIS — N6320 Unspecified lump in the left breast, unspecified quadrant: Secondary | ICD-10-CM

## 2012-02-14 ENCOUNTER — Other Ambulatory Visit: Payer: Medicaid Other

## 2012-02-23 ENCOUNTER — Ambulatory Visit
Admission: RE | Admit: 2012-02-23 | Discharge: 2012-02-23 | Disposition: A | Discharge status: Home / Self Care | Payer: Medicaid Other | Source: Ambulatory Visit | Attending: Geriatric Medicine | Admitting: Geriatric Medicine

## 2012-02-23 ENCOUNTER — Other Ambulatory Visit: Payer: Self-pay | Admitting: Geriatric Medicine

## 2012-02-23 DIAGNOSIS — N6320 Unspecified lump in the left breast, unspecified quadrant: Secondary | ICD-10-CM

## 2012-02-24 ENCOUNTER — Other Ambulatory Visit: Payer: Self-pay | Admitting: Geriatric Medicine

## 2012-02-24 DIAGNOSIS — C50912 Malignant neoplasm of unspecified site of left female breast: Secondary | ICD-10-CM

## 2012-02-29 ENCOUNTER — Encounter (INDEPENDENT_AMBULATORY_CARE_PROVIDER_SITE_OTHER): Payer: Self-pay | Admitting: Surgery

## 2012-02-29 ENCOUNTER — Ambulatory Visit (INDEPENDENT_AMBULATORY_CARE_PROVIDER_SITE_OTHER): Payer: Medicaid Other | Admitting: Surgery

## 2012-02-29 VITALS — BP 146/90 | HR 72 | Ht 63.0 in | Wt 202.0 lb

## 2012-02-29 DIAGNOSIS — D0590 Unspecified type of carcinoma in situ of unspecified breast: Secondary | ICD-10-CM

## 2012-02-29 DIAGNOSIS — D0592 Unspecified type of carcinoma in situ of left breast: Secondary | ICD-10-CM

## 2012-02-29 NOTE — Patient Instructions (Addendum)
Return after MRI next week.Your MRI is 03/02/2012 AT 115PM.    Breast Cancer, What You Should Know Breast cancer is one of the most common types of cancer in women. It is the second leading cause of death for all women.  The probability that breast cancer will return after treatment is directly related to the stage of its malignancy. This means how advanced the cancer is before it is found and treated. If breast cancer is found and treated early, before the cancer has spread to the lymph nodes, your chance for survival is much better. Delays in diagnosing or treating breast cancer can result in spread of the cancer. This happens when warning signs are ignored and proper measures for diagnosis or treatment are not taken. When breast cancer spreads beyond the breast to other parts of the body, staging is done in order to find out the extent of the cancer and to give the best treatment available. Today, there is a better chance of survival in the treatment of breast cancer. In the past 20 to 30 years, the diagnosis and treatment of breast cancer has greatly improved. There are more options for the type of surgery available (not just radical breast removal [mastectomy] and removal of lymph nodes in the armpit). There are also improved medicines, chemotherapy, radiation therapy, and breast reconstruction. These innovations have improved the survival rate and have helped with complications of breast cancer that were present in the past. Radiation, chemotherapy, and medicines may be used before or after surgery to shrink the tumor, kill any remaining cancer cells, and to prevent spreading and recurrence of the cancer. Research to develop new medicines, procedures, and combinations of treatments is constantly being done to help improve prevention, treatment, and to reduce recurrence of breast cancer. TYPES OF BREAST CANCER  In situ. Cancer is contained in the tumor and has not spread.   Invasive. Cancer has spread  outside the tumor.   Inflammatory. The whole breast is red (inflamed), painful, and swollen (rare).   Paget's disease. Cancer starts in the nipple and spreads to the areola (rare).   Breast cancer in the milk ducts.   Breast cancer in the milk lobules.  Males can get breast cancer, but it is very rare. RISK FACTORS There are certain conditions and circumstances that place some women at risk for developing breast cancer. If you have any or several of these risks, you should be aware of them and take extra precautions to take better care of yourself. Some of these risk factors include:  Previous history of breast cancer.   Family history of breast cancer.   Abnormal genes present in your body (BRCA 1, BRCA 2, and HER-2).   Calcium deposits (calcifications) seen on your breast X-ray (mammogram).   Starting your menstrual periods before age 16 (early menarche).   Late menopause, at age 13 or older.   Heavy radiation exposure to the chest.   Cancer of the uterus, ovary, or intestine.   Drinking too much alcohol.   Never having a baby or breastfeeding.   Taking too much hormone treatment for too long.   Being very tall.   Being of Jewish descent.   Obesity.   Presence of estrogen or progesterone receptor cells in the breast.   Smoking.  Low-dose birth control pills and fibrocystic disease of the breast are not thought to cause breast cancer. MANAGING BREAST PROBLEMS The following are steps that should be taken to avoid a bad outcome with a  breast problem:  You should practice "breast self-awareness." This means understanding the normal appearance and feel of your breasts and may include breast self-exams. Any changes detected, no matter how small, should be reported to your caregiver. Women in their 60s and 60s should have a clinical breast exam (CBE) by a caregiver as part of a regular health exam every 1 to 3 years. After age 60, women should have a CBE every year.   All  lumps should be evaluated thoroughly by ultrasound, mammogram, magnetic resonance imaging (MRI) scan, tissue sample (biopsy) exam, or removed and examined for cancer.   If you are told you have a breast infection, make sure you follow up with your caregiver until it gets better and goes away.   If you are told a tumor is benign (noncancerous), question the diagnosis and ask for a biopsy to make sure.   All tumors should be removed and examined for cancer.   Do not disregard sharp pains in your breast. Make sure you have an answer for what is causing the pain and follow up properly.   If you have signs of pulling in (retraction) of your nipple, make sure you understand the reason for this and that proper studies are done.   If you have signs of discharge from your nipple, especially blood, make sure you are told the reason for this and that proper tests are done.   If a needle aspiration biopsy is done and is negative, make sure you know when to follow up with this, and when repeat testing should be done. Get a second opinion if necessary.   Do not rely only on mammograms. Be sure to have regular self-exams and physical breast exams by your caregiver.   Have a mammogram done on a regular basis, or as recommended. The American Cancer Society has guidelines based on age and risk factors that may be present.   If there are findings from your mammogram, make sure you follow up properly. Make sure you know when additional testing needs to be done.  You should know that:  Radiation from mammograms does not cause cancer or other problems to the breast, skin, heart, or lungs.   Other screening tests for the breast are ultrasound and MRI scans.   There are medicines available that may help prevent breast cancer or help prevent recurrence in women at high risk, 60 years old or older. These medicines block estrogen hormone from getting into the tumor:   Tamoxifen.   Raloxifene.   Trastuzumab.     Sometimes, the ovaries will be removed to decrease estrogen or progesterone hormone production. These hormones may stimulate the growth, recurrence, or spread of breast cancer.  Finding out the results of your test When you have breast testing done, ask when your test results will be ready. Make sure you get your test results. FOR MORE INFORMATION American Cancer Society: www.cancer.org Document Released: 01/26/2006 Document Revised: 10/07/2011 Document Reviewed: 09/04/2009 [facility] Patient Information 2012 [place], [place].

## 2012-02-29 NOTE — Progress Notes (Signed)
Patient ID: Sherri Hays, female   DOB: 1951/10/05, 61 y.o.   MRN: [id number]  No chief complaint on file.   HPI Sherri Hays is a 61 y.o. female.   HPIPatient sent at the request of Dr. 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 She underwent a CT scan of her chest to months ago for shortness of breath. It showed a one severe mass in the left central to lower quadrant. Mammography and ultrasound showed this area to be about 2 cm. She had multiple left breast cysts in the lower port of the breast. The area was biopsy which showed DCIS. She had left axillary lymphadenopathy and the node was biopsied which was normal. She is sore for biopsy. She denies any history of left breast pain, mass or nipple drainage.  Past Medical History  Diagnosis Date  . Hypertension   . Diabetes mellitus   . Dysphagia   . TIA (transient ischemic attack)   . Genital herpes   . DM neuropathies   . Schizophrenia, simple   . Anemia   . Arthritis   . Asthma   . Cancer   . Stroke     Past Surgical History  Procedure Date  . Cholecystectomy     No family history on file.  Social History History  Substance Use Topics  . Smoking status: Current Everyday Smoker -- 0.5 packs/day  . Smokeless tobacco: Not on file  . Alcohol Use: No    Allergies  Allergen Reactions  . Penicillins Other (See Comments)    unknown    Current Outpatient Prescriptions  Medication Sig Dispense Refill  . acetaminophen (TYLENOL) 500 MG tablet Take 1,000 mg by mouth every 6 (six) hours as needed. For pain      . albuterol (PROVENTIL HFA;VENTOLIN HFA) 108 (90 BASE) MCG/ACT inhaler Inhale 2 puffs into the lungs every 6 (six) hours as needed. For dyspnea      . aspirin EC 81 MG tablet Take 81 mg by mouth daily.      Marland Kitchen atorvastatin (LIPITOR) 10 MG tablet Take 10 mg by mouth at bedtime.      . diclofenac (VOLTAREN) 75 MG EC tablet Take 75 mg by mouth 2 (two) times daily.      . fluPHENAZine  (PROLIXIN) 10 MG tablet Take 10 mg by mouth at bedtime.      . fluPHENAZine decanoate (PROLIXIN) 25 MG/ML injection Inject 25 mg into the muscle every 14 (fourteen) days. Injection given at [facility]      . gabapentin (NEURONTIN) 300 MG capsule Take 300 mg by mouth 3 (three) times daily.      Marland Kitchen guaiFENesin (DIABETIC TUSSIN EX) 100 MG/5ML liquid Take 200 mg by mouth every 4 (four) hours as needed. For cough      . insulin glargine (LANTUS) 100 UNIT/ML injection Inject 30 Units into the skin at bedtime.      Marland Kitchen lisinopril (PRINIVIL,ZESTRIL) 2.5 MG tablet Take 2.5 mg by mouth daily.      Marland Kitchen loperamide (IMODIUM) 2 MG capsule Take 2 mg by mouth 4 (four) times daily as needed. For diarrhea      . Melatonin 1 MG TABS Take 1 tablet by mouth at bedtime.      . metFORMIN (GLUCOPHAGE) 500 MG tablet Take 500-1,000 mg by mouth See admin instructions. Take 1 tab in the morning and 2 tabs in the evening.      . metoprolol  succinate (TOPROL-XL) 25 MG 24 hr tablet Take 25 mg by mouth every evening.      . Multiple Vitamin (MULITIVITAMIN WITH MINERALS) TABS Take 1 tablet by mouth daily.      Marland Kitchen omeprazole (PRILOSEC) 20 MG capsule Take 20 mg by mouth daily with breakfast.      . trihexyphenidyl (ARTANE) 2 MG tablet Take 1 mg by mouth at bedtime.         Review of Systems Review of Systems  Constitutional: Negative.   HENT: Negative.   Eyes: Negative.   Respiratory: Positive for shortness of breath and wheezing.   Cardiovascular: Negative.   Gastrointestinal: Negative.   Genitourinary: Negative.   Musculoskeletal: Negative.   Neurological: Negative.   Hematological: Negative.   Psychiatric/Behavioral: Negative.     Blood pressure 146/90, pulse 72, height 5\' 3"  (1.6 m), weight 202 lb (91.627 kg).  Physical Exam Physical Exam  Constitutional: She is oriented to person, place, and time. She appears well-developed and well-nourished.  HENT:  Head: Normocephalic and atraumatic.  Eyes: EOM are normal.  Pupils are equal, round, and reactive to light.  Neck: Normal range of motion. Neck supple.  Cardiovascular: Normal rate and regular rhythm.   Pulmonary/Chest: Effort normal. She has wheezes.       No left breast mass. Large left axillary lymph node measuring 3 cm. Right breast normal. No evidence of right axillary adenopathy.  Abdominal: Soft. She exhibits no distension. There is no tenderness.  Musculoskeletal: Normal range of motion.  Neurological: She is alert and oriented to person, place, and time.  Skin: Skin is warm and dry.  Psychiatric: She has a normal mood and affect. Her behavior is normal. Judgment and thought content normal.    Data Reviewed MAMMOGRAM 2 CM AREA OF MICROCALCIFICATIONS CORE BIOPSY SHOWS DCIS.  LEFT AXILLARY LYMPH NODE BIOPSY NEGATIVE.  MRI PENDING.  CT CHEST SHOWS 2 CM MASS LEFT BREAST CENTRAL LOWER INNER QUADRANT  Assessment    Left breast DCIS    Plan    Complex mammogram and lymphadenopathy in left axilla.  She is getting an MRI of her breast.  Return after clinic for surgical planning.  She has an interest in breast conservation which I have discussed.  She will need the lymph node removed at the time of surgery.       Buffy Ehler A. 02/29/2012, 3:05 PM

## 2012-03-02 ENCOUNTER — Ambulatory Visit
Admission: RE | Admit: 2012-03-02 | Discharge: 2012-03-02 | Disposition: A | Discharge status: Home / Self Care | Payer: Medicaid Other | Source: Ambulatory Visit | Attending: Geriatric Medicine | Admitting: Geriatric Medicine

## 2012-03-02 DIAGNOSIS — C50912 Malignant neoplasm of unspecified site of left female breast: Secondary | ICD-10-CM

## 2012-03-02 MED ORDER — GADOBENATE DIMEGLUMINE 529 MG/ML IV SOLN
20.0000 mL | Freq: Once | INTRAVENOUS | Status: AC | PRN
  Administered 2012-03-02: 20 mL via INTRAVENOUS

## 2012-03-06 ENCOUNTER — Encounter (INDEPENDENT_AMBULATORY_CARE_PROVIDER_SITE_OTHER): Payer: Self-pay | Admitting: Surgery

## 2012-03-06 ENCOUNTER — Ambulatory Visit (INDEPENDENT_AMBULATORY_CARE_PROVIDER_SITE_OTHER): Payer: Medicaid Other | Admitting: Surgery

## 2012-03-06 VITALS — BP 142/98 | HR 82 | Temp 98.0°F | Resp 22 | Ht 63.0 in | Wt 203.8 lb

## 2012-03-06 DIAGNOSIS — D0590 Unspecified type of carcinoma in situ of unspecified breast: Secondary | ICD-10-CM

## 2012-03-06 DIAGNOSIS — D0510 Intraductal carcinoma in situ of unspecified breast: Secondary | ICD-10-CM

## 2012-03-06 DIAGNOSIS — C50919 Malignant neoplasm of unspecified site of unspecified female breast: Secondary | ICD-10-CM

## 2012-03-06 NOTE — Progress Notes (Signed)
Patient ID: Sherri Hays, female   DOB: [birth year], 61 y.o.   MRN: [id number]  Chief Complaint  Patient presents with  . Breast Cancer    reck left breast    HPI Sherri Hays is a 61 y.o. female.   HPIPatient sent at the request of Dr. 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 She underwent a CT scan of her chest to months ago for shortness of breath. It showed a one severe mass in the left central to lower quadrant. Mammography and ultrasound showed this area to be about 2 cm. She had multiple left breast cysts in the lower port of the breast. The area was biopsy which showed DCIS. She had left axillary lymphadenopathy and the node was biopsied which was normal. She is sore for biopsy. She denies any history of left breast pain, mass or nipple drainage.  Past Medical History  Diagnosis Date  . Hypertension   . Diabetes mellitus   . Dysphagia   . TIA (transient ischemic attack)   . Genital herpes   . DM neuropathies   . Schizophrenia, simple   . Anemia   . Arthritis   . Asthma   . Stroke   . Cancer     lt breast    Past Surgical History  Procedure Date  . Cholecystectomy   . Cesarean section   . Tonsillectomy     History reviewed. No pertinent family history.  Social History History  Substance Use Topics  . Smoking status: Current Everyday Smoker -- 0.5 packs/day  . Smokeless tobacco: Not on file  . Alcohol Use: No    Allergies  Allergen Reactions  . Penicillins Other (See Comments)    unknown    Current Outpatient Prescriptions  Medication Sig Dispense Refill  . acetaminophen (TYLENOL) 500 MG tablet Take 1,000 mg by mouth every 6 (six) hours as needed. For pain      . albuterol (PROVENTIL HFA;VENTOLIN HFA) 108 (90 BASE) MCG/ACT inhaler Inhale 2 puffs into the lungs every 6 (six) hours as needed. For dyspnea      . aspirin EC 81 MG tablet Take 81 mg by mouth daily.      Marland Kitchen atorvastatin (LIPITOR) 10 MG tablet Take 10 mg by  mouth at bedtime.      . diclofenac (VOLTAREN) 75 MG EC tablet Take 75 mg by mouth 2 (two) times daily.      . fluPHENAZine (PROLIXIN) 10 MG tablet Take 10 mg by mouth at bedtime.      . fluPHENAZine decanoate (PROLIXIN) 25 MG/ML injection Inject 25 mg into the muscle every 14 (fourteen) days. Injection given at [facility]      . gabapentin (NEURONTIN) 300 MG capsule Take 300 mg by mouth 3 (three) times daily.      Marland Kitchen guaiFENesin (DIABETIC TUSSIN EX) 100 MG/5ML liquid Take 200 mg by mouth every 4 (four) hours as needed. For cough      . insulin glargine (LANTUS) 100 UNIT/ML injection Inject 30 Units into the skin at bedtime.      Marland Kitchen lisinopril (PRINIVIL,ZESTRIL) 2.5 MG tablet Take 2.5 mg by mouth daily.      Marland Kitchen loperamide (IMODIUM) 2 MG capsule Take 2 mg by mouth 4 (four) times daily as needed. For diarrhea      . Melatonin 1 MG TABS Take 1 tablet by mouth at bedtime.      . metFORMIN (GLUCOPHAGE) 500 MG tablet  Take 500-1,000 mg by mouth See admin instructions. Take 1 tab in the morning and 2 tabs in the evening.      . metoprolol succinate (TOPROL-XL) 25 MG 24 hr tablet Take 25 mg by mouth every evening.      . Multiple Vitamin (MULITIVITAMIN WITH MINERALS) TABS Take 1 tablet by mouth daily.      Marland Kitchen omeprazole (PRILOSEC) 20 MG capsule Take 20 mg by mouth daily with breakfast.      . trihexyphenidyl (ARTANE) 2 MG tablet Take 1 mg by mouth at bedtime.         Review of Systems Review of Systems  Constitutional: Negative.   HENT: Negative.   Eyes: Negative.   Respiratory: Positive for shortness of breath and wheezing.   Cardiovascular: Negative.   Gastrointestinal: Negative.   Genitourinary: Negative.   Musculoskeletal: Negative.   Neurological: Negative.   Hematological: Negative.   Psychiatric/Behavioral: Negative.     Blood pressure 142/98, pulse 82, temperature 98 F (36.7 C), temperature source Temporal, resp. rate 22, height 5\' 3"  (1.6 m), weight 203 lb 12.8 oz (92.443  kg).  Physical Exam Physical Exam  Constitutional: She is oriented to person, place, and time. She appears well-developed and well-nourished.  HENT:  Head: Normocephalic and atraumatic.  Eyes: EOM are normal. Pupils are equal, round, and reactive to light.  Neck: Normal range of motion. Neck supple.  Cardiovascular: Normal rate and regular rhythm.   Pulmonary/Chest: Effort normal. She has wheezes.       No left breast mass. Large left axillary lymph node measuring 3 cm. Right breast normal. No evidence of right axillary adenopathy.  Abdominal: Soft. She exhibits no distension. There is no tenderness.  Musculoskeletal: Normal range of motion.  Neurological: She is alert and oriented to person, place, and time.  Skin: Skin is warm and dry.  Psychiatric: She has a normal mood and affect. Her behavior is normal. Judgment and thought content normal.    Data Reviewed MAMMOGRAM 2 CM AREA OF MICROCALCIFICATIONS CORE BIOPSY SHOWS DCIS.  LEFT AXILLARY LYMPH NODE BIOPSY NEGATIVE.  MRI PENDING.  CT CHEST SHOWS 2 CM MASS LEFT BREAST CENTRAL LOWER INNER QUADRANT  Assessment    Left breast DCIS    Plan   5 CM AREA OF ENHANCEMENT LEFT BREAST WITH DCIS.  Too large for breast conservation.  Recommend left simple mastectomy and left sentinel lymph node mapping.  Pt desires reconstruction.  Will refer to Dr Kelly Splinter.The surgical and non surgical options have been discussed with the patient.  Risks of surgery include bleeding,  Infection,  Flap necrosis,  Tissue loss,  Chronic pain, death, Numbness,  And the need for additional procedures.  Reconstruction options also have been discussed with the patient as well.  The patient agrees to proceed.       Sherri Hays A. 03/06/2012, 12:17 PM

## 2012-03-06 NOTE — Patient Instructions (Signed)
Total or Modified Radical Mastectomy  Care After Refer to this sheet in the next few weeks. These instructions provide you with information on caring for yourself after your procedure. Your caregiver may also give you more specific instructions. Your treatment has been planned according to current medical practices, but problems sometimes occur. Call your caregiver if you have any problems or questions after your procedure. ACTIVITY  Your caregiver will advise you when you may resume strenuous activities, driving, and sports.   After the drain(s) are removed, you may do light housework. Avoid heavy lifting, carrying, or pushing. You should not be lifting anything heavier than 5 lbs.   Take frequent rest periods. You may tire more easily than usual.   Always rest and elevate the arm affected by your surgery for a period of time equal to your activity time.   Continue doing the exercises given to you by the physical therapist/occupational therapist even after full range of motion has returned. The amount of time this takes will vary from person to person.   After normal range of motion has returned, some stiffness and soreness may persist for 2-3 months. This is normal and will subside.   Begin sports or strenuous activities in moderation. This will give you a chance to rebuild your endurance. Continue to be cautious of heavy lifting or carrying (no more than 10 lbs.) with your affected arm.   You may return to work as recommended by your caregiver.  NUTRITION  You may resume your normal diet.   Make sure you drink plenty of fluids (6-8 glasses a day).   Eat a well-balanced diet. Including daily portions of food from government recommended food groups:   Grains.   Vegetables.   Fruits.   Milk.   Meat & beans.   Oils.  Visit http://mypyramid.gov/ for more information HYGIENE  You may wash your hair.   If your incision (cut from surgery) is closed, you may shower or tub bathe,  unless instructed otherwise by your doctor.  FEVER  If you feel feverish or have shaking chills, take your temperature. If your temperature is 102 F (38.9 C) or above, call your caregiver. The fever may mean there is an infection.   If you call early, infection can be treated with antibiotics and hospitalization may be avoided.  PAIN CONTROL  Mild discomfort may occur.   You may need to take an over-the-counter pain medication or a medication prescribed by your caregiver.   Call your caregiver if you experience increased pain.  INCISION CARE  Check your incision daily for increased redness, drainage, swelling, or separation of skin.   Call your caregiver if any of the above are noted.  ARM AND HAND CARE  If the lymph nodes under your arm were removed with a modified radical mastectomy, there may be a greater tendency for the arm to swell.   Try to avoid having blood pressures taken, blood drawn, or injections given in the affected arm. This is the arm on the same side as the surgery.   Use hand lotion to soften cuticles instead of cutting them to avoid cutting yourself.   Be careful when shaving your under arms. Use an electric shaver if possible. You may use a deodorant after the incision has completely healed. Until then, clean under your arms with hydrogen peroxide.   Use reasonable precaution when cooking, sewing, and gardening to avoid burning or needle or thorn pricks.   Do not weigh your arm straight   down with a package or your purse.   Follow the exercises and instructions given to you by the physical therapist/occupational therapist and your caregiver.  FOLLOW-UP APPOINTMENT Call your caregiver for a follow-up appointment as directed. PROSTHESIS INFORMATION Wear your temporary prosthesis (artificial breast) until your caregiver gives you permission to purchase a permanent one. This will depend upon your rate of healing. We suggest you also wait until you are physically  and emotionally ready to shop for one. The suitability depends on several individual factors. We do not endorse any particular prosthesis, but suggest you try several until you are satisfied with appearance and fit. A list of stores may be obtained from your local [employer] at www.cancer.org or 1-800-ACS-2345 (1-7785142510). A permanent prosthesis is medically necessary to restore balance. It is also income tax deductible. Be sure all receipts are marked "surgical". It is not essential to purchase a bra. You may sew a pocket into your regular bra. Note: Remember to take all of your medical insurance information with you when shopping for your prosthesis. SELECTING A PROSTHESIS FITTER You may want to ask the following questions when selecting a fitter:  What styles and brands of forms are carried in stock?   How long have the forms been on the market and have there been any problems with them?   Why would one form be better than another?   How long should a particular form last?   May I wear the form for a trial period without obligation?   Do the forms require a prosthetic bra? If so, what is the price range? Must I always wear that style?   If alterations to the bra are necessary, can they be done at this location or be sent out?   Will I be charged for alterations?   Will I receive suggestions on how to alter my own wardrobe, if necessary?   Will you special order forms or bras if necessary?   Are fitters always available to meet my needs?   What kinds of garments should be worn for the fitting?   Are lounge wear, swim wear, and accessories available?   If I have insurance coverage or Medicare, will you suggest ways for processing the paper work?   Do you keep complete records so that mail reordering is possible?   How are warranty claims handled if I have a problem with the form?  Document Released: 06/10/2004 Document Revised: 10/07/2011 Document Reviewed:  02/13/2008 [facility] Patient Information 2012 [place], [place].  Mastectomy, With or Without Reconstruction Mastectomy (removal of the breast) is a procedure most commonly used to treat cancer (tumor) of the breast. Different procedures are available for treatment. This depends on the stage of the tumor (abnormal growths). Discuss this with your caregiver, surgeon (a specialist for performing operations such as this), or oncologist (someone specialized in the treatment of cancer). With proper information, you can decide which treatment is best for you. Although the sound of the word cancer is frightening to all of [place], the new treatments and medications can be a source of reassurance and comfort. If there are things you are worried about, discuss them with your caregiver. He or she can help comfort you and your family. Some of the different procedures for treating breast cancer are:  Radical (extensive) mastectomy. This is an operation used to remove the entire breast, the muscles under the breast, and all of the glands (lymph nodes) under the arm. With all of the new treatments  available for cancer of the breast, this procedure has become less common.   Modified radical mastectomy. This is a similar operation to the radical mastectomy described above. In the modified radical mastectomy, the muscles of the chest wall are not removed unless one of the lessor muscles is removed. One of the lessor muscles may be removed to allow better removal of the lymph nodes. The axillary lymph nodes are also removed. Rarely, during an axillary node dissection nerves to this area are damaged. Radiation therapy is then often used to the area following this surgery.   A total mastectomy also known as a complete or simple mastectomy. It involves removal of only the breast. The lymph nodes and the muscles are left in place.   In a lumpectomy, the lump is removed from the breast. This is the simplest form of surgical treatment.  A sentinel lymph node biopsy may also be done. Additional treatment may be required.  RISKS AND COMPLICATIONS The main problems that follow removal of the breast include:  Infection (germs start growing in the wound). This can usually be treated with antibiotics (medications that kill germs).   Lymphedema. This means the arm on the side of the breast that was operated on swells because the lymph (tissue fluid) cannot follow the main channels back into the body. This only occurs when the lymph nodes have had to be removed under the arm.   There may be some areas of numbness to the upper arm and around the incision (cut by the surgeon) in the breast. This happens because of the cutting of or damage to some of the nerves in the area. This is most often unavoidable.   There may be difficultymoving the arm in a full range of motion (moving in all directions) following surgery. This usually improves with time following use and exercise.   Recurrence of breast cancer may happen with the very best of surgery and follow up treatment. Sometimes small cancer cells that cannot be seen with the naked eye have already spread at the time of surgery. When this happens other treatment is available. This treatment may be radiation, medications or a combination of both.  RECONSTRUCTION Reconstruction of the breast may be done immediately if there is not going to be post-operative radiation. This surgery is done for cosmetic (improve appearance) purposes to improve the physical appearance after the operation. This may be done in two ways:  It can be done using a saline filled prosthetic (an artificial breast which is filled with salt water). Silicone breast implants are now re-approved by the FDA and are being commonly used.   Reconstruction can be done using the body's own muscle/fat/skin.  Your caregiver will discuss your options with you. Depending upon your needs or choice, together you will be able to determine  which procedure is best for you. Document Released: 07/13/2001 Document Revised: 10/07/2011 Document Reviewed: 03/05/2008 [facility] Patient Information 2012 [place], [place].

## 2012-03-08 ENCOUNTER — Telehealth (INDEPENDENT_AMBULATORY_CARE_PROVIDER_SITE_OTHER): Payer: Self-pay

## 2012-03-08 ENCOUNTER — Other Ambulatory Visit (INDEPENDENT_AMBULATORY_CARE_PROVIDER_SITE_OTHER): Payer: Self-pay | Admitting: Surgery

## 2012-03-08 DIAGNOSIS — D0510 Intraductal carcinoma in situ of unspecified breast: Secondary | ICD-10-CM

## 2012-03-08 NOTE — Telephone Encounter (Signed)
Called facility where patient is staying and talked to her nurse. Relayed message that patient will only need lumpectomy instead of mastectomy. I called and cancelled appointment patient had with Dr. Kelly Splinter.

## 2012-03-13 ENCOUNTER — Other Ambulatory Visit (INDEPENDENT_AMBULATORY_CARE_PROVIDER_SITE_OTHER): Payer: Self-pay | Admitting: Surgery

## 2012-03-13 DIAGNOSIS — D0510 Intraductal carcinoma in situ of unspecified breast: Secondary | ICD-10-CM

## 2012-03-15 ENCOUNTER — Other Ambulatory Visit (INDEPENDENT_AMBULATORY_CARE_PROVIDER_SITE_OTHER): Payer: Self-pay | Admitting: Surgery

## 2012-03-15 DIAGNOSIS — D0510 Intraductal carcinoma in situ of unspecified breast: Secondary | ICD-10-CM

## 2012-03-15 NOTE — Progress Notes (Signed)
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  Will change to lumpectomy since patient wishes to conserve breast and do SLN due to location of the lesion.

## 2012-03-16 ENCOUNTER — Emergency Department (HOSPITAL_COMMUNITY): Payer: Medicaid Other

## 2012-03-16 ENCOUNTER — Other Ambulatory Visit: Payer: Self-pay

## 2012-03-16 ENCOUNTER — Encounter (HOSPITAL_COMMUNITY): Payer: Self-pay | Admitting: Emergency Medicine

## 2012-03-16 ENCOUNTER — Inpatient Hospital Stay (HOSPITAL_COMMUNITY)
Admission: EM | Admit: 2012-03-16 | Discharge: 2012-03-20 | DRG: 190 | Disposition: A | Discharge status: Home Health | Payer: Medicaid Other | Attending: Internal Medicine | Admitting: Internal Medicine

## 2012-03-16 DIAGNOSIS — J441 Chronic obstructive pulmonary disease with (acute) exacerbation: Principal | ICD-10-CM | POA: Diagnosis present

## 2012-03-16 DIAGNOSIS — I10 Essential (primary) hypertension: Secondary | ICD-10-CM

## 2012-03-16 DIAGNOSIS — K219 Gastro-esophageal reflux disease without esophagitis: Secondary | ICD-10-CM | POA: Diagnosis present

## 2012-03-16 DIAGNOSIS — E119 Type 2 diabetes mellitus without complications: Secondary | ICD-10-CM

## 2012-03-16 DIAGNOSIS — R079 Chest pain, unspecified: Secondary | ICD-10-CM

## 2012-03-16 DIAGNOSIS — J45901 Unspecified asthma with (acute) exacerbation: Secondary | ICD-10-CM

## 2012-03-16 DIAGNOSIS — J189 Pneumonia, unspecified organism: Secondary | ICD-10-CM | POA: Diagnosis present

## 2012-03-16 DIAGNOSIS — D7289 Other specified disorders of white blood cells: Secondary | ICD-10-CM

## 2012-03-16 DIAGNOSIS — E1149 Type 2 diabetes mellitus with other diabetic neurological complication: Secondary | ICD-10-CM | POA: Diagnosis present

## 2012-03-16 DIAGNOSIS — J449 Chronic obstructive pulmonary disease, unspecified: Secondary | ICD-10-CM

## 2012-03-16 DIAGNOSIS — E1165 Type 2 diabetes mellitus with hyperglycemia: Secondary | ICD-10-CM

## 2012-03-16 DIAGNOSIS — E1142 Type 2 diabetes mellitus with diabetic polyneuropathy: Secondary | ICD-10-CM | POA: Diagnosis present

## 2012-03-16 DIAGNOSIS — E118 Type 2 diabetes mellitus with unspecified complications: Secondary | ICD-10-CM

## 2012-03-16 DIAGNOSIS — F209 Schizophrenia, unspecified: Secondary | ICD-10-CM

## 2012-03-16 DIAGNOSIS — E785 Hyperlipidemia, unspecified: Secondary | ICD-10-CM | POA: Diagnosis present

## 2012-03-16 DIAGNOSIS — R197 Diarrhea, unspecified: Secondary | ICD-10-CM

## 2012-03-16 DIAGNOSIS — J45909 Unspecified asthma, uncomplicated: Secondary | ICD-10-CM

## 2012-03-16 DIAGNOSIS — D72829 Elevated white blood cell count, unspecified: Secondary | ICD-10-CM | POA: Diagnosis present

## 2012-03-16 LAB — CBC
Hemoglobin: 12.6 g/dL (ref 12.0–15.0)
Hemoglobin: 12.9 g/dL (ref 12.0–15.0)
MCHC: 31.6 g/dL (ref 30.0–36.0)
RBC: 4.72 MIL/uL (ref 3.87–5.11)
RBC: 4.89 MIL/uL (ref 3.87–5.11)
WBC: 11.6 10*3/uL — ABNORMAL HIGH (ref 4.0–10.5)

## 2012-03-16 LAB — DIFFERENTIAL
Basophils Relative: 0 % (ref 0–1)
Basophils Relative: 0 % (ref 0–1)
Lymphocytes Relative: 5 % — ABNORMAL LOW (ref 12–46)
Lymphs Abs: 4.8 10*3/uL — ABNORMAL HIGH (ref 0.7–4.0)
Monocytes Absolute: 0.1 10*3/uL (ref 0.1–1.0)
Monocytes Relative: 8 % (ref 3–12)
Monocytes Relative: 1 % — ABNORMAL LOW (ref 3–12)
Neutro Abs: 5.2 10*3/uL (ref 1.7–7.7)
Neutro Abs: 16 10*3/uL — ABNORMAL HIGH (ref 1.7–7.7)
Neutrophils Relative %: 45 % (ref 43–77)

## 2012-03-16 LAB — URINALYSIS, ROUTINE W REFLEX MICROSCOPIC
Leukocytes, UA: NEGATIVE
Specific Gravity, Urine: 1.014 (ref 1.005–1.030)
Urobilinogen, UA: 0.2 mg/dL (ref 0.0–1.0)

## 2012-03-16 LAB — COMPREHENSIVE METABOLIC PANEL
ALT: 21 U/L (ref 0–35)
Alkaline Phosphatase: 73 U/L (ref 39–117)
GFR calc Af Amer: >90 mL/min (ref >90)
Glucose, Bld: 114 mg/dL — ABNORMAL HIGH (ref 70–99)
Potassium: 3.7 mEq/L (ref 3.5–5.1)
Sodium: 142 mEq/L (ref 135–145)
Total Protein: 7.7 g/dL (ref 6.0–8.3)

## 2012-03-16 LAB — BASIC METABOLIC PANEL
CO2: 28 mEq/L (ref 19–32)
GFR calc non Af Amer: >90 mL/min (ref >90)
Glucose, Bld: 76 mg/dL (ref 70–99)
Potassium: 4.2 mEq/L (ref 3.5–5.1)
Sodium: 139 mEq/L (ref 135–145)

## 2012-03-16 LAB — URINE MICROSCOPIC-ADD ON

## 2012-03-16 MED ORDER — HYDROXYZINE PAMOATE 25 MG PO CAPS
25.0000 mg | ORAL_CAPSULE | Freq: Four times a day (QID) | ORAL | Status: DC | PRN
  Filled 2012-03-16: qty 1

## 2012-03-16 MED ORDER — ENOXAPARIN SODIUM 40 MG/0.4ML ~~LOC~~ SOLN
40.0000 mg | SUBCUTANEOUS | Status: DC
  Administered 2012-03-16 – 2012-03-21 (×6): 40 mg via SUBCUTANEOUS
  Filled 2012-03-16 (×6): qty 0.4

## 2012-03-16 MED ORDER — LISINOPRIL 10 MG PO TABS
10.0000 mg | ORAL_TABLET | Freq: Every day | ORAL | Status: DC
  Administered 2012-03-17: 10 mg via ORAL
  Filled 2012-03-16: qty 1

## 2012-03-16 MED ORDER — GUAIFENESIN 100 MG/5ML PO SOLN
10.0000 mL | ORAL | Status: DC | PRN
  Administered 2012-03-16: 200 mg via ORAL
  Filled 2012-03-16: qty 10

## 2012-03-16 MED ORDER — DARIFENACIN HYDROBROMIDE ER 7.5 MG PO TB24
7.5000 mg | ORAL_TABLET | Freq: Every day | ORAL | Status: DC
  Administered 2012-03-16 – 2012-03-21 (×6): 7.5 mg via ORAL
  Filled 2012-03-16 (×6): qty 1

## 2012-03-16 MED ORDER — ATORVASTATIN CALCIUM 10 MG PO TABS
10.0000 mg | ORAL_TABLET | Freq: Every day | ORAL | Status: DC
  Administered 2012-03-16 – 2012-03-20 (×5): 10 mg via ORAL
  Filled 2012-03-16 (×6): qty 1

## 2012-03-16 MED ORDER — ALBUTEROL SULFATE (5 MG/ML) 0.5% IN NEBU
2.5000 mg | INHALATION_SOLUTION | RESPIRATORY_TRACT | Status: DC | PRN
  Administered 2012-03-16 – 2012-03-17 (×2): 2.5 mg via RESPIRATORY_TRACT
  Filled 2012-03-16 (×2): qty 0.5

## 2012-03-16 MED ORDER — METHYLPREDNISOLONE SODIUM SUCC 125 MG IJ SOLR
60.0000 mg | Freq: Two times a day (BID) | INTRAMUSCULAR | Status: DC
  Administered 2012-03-16 – 2012-03-17 (×2): 60 mg via INTRAVENOUS
  Filled 2012-03-16 (×4): qty 0.96

## 2012-03-16 MED ORDER — DICLOFENAC SODIUM 75 MG PO TBEC
75.0000 mg | DELAYED_RELEASE_TABLET | Freq: Two times a day (BID) | ORAL | Status: DC
  Administered 2012-03-16 – 2012-03-21 (×11): 75 mg via ORAL
  Filled 2012-03-16 (×12): qty 1

## 2012-03-16 MED ORDER — DM-GUAIFENESIN ER 30-600 MG PO TB12
1.0000 | ORAL_TABLET | Freq: Two times a day (BID) | ORAL | Status: DC
  Administered 2012-03-16 – 2012-03-21 (×11): 1 via ORAL
  Filled 2012-03-16 (×12): qty 1

## 2012-03-16 MED ORDER — ALBUTEROL SULFATE (5 MG/ML) 0.5% IN NEBU
5.0000 mg | INHALATION_SOLUTION | Freq: Once | RESPIRATORY_TRACT | Status: AC
  Administered 2012-03-16: 5 mg via RESPIRATORY_TRACT
  Filled 2012-03-16: qty 1

## 2012-03-16 MED ORDER — IPRATROPIUM BROMIDE 0.02 % IN SOLN
0.5000 mg | Freq: Once | RESPIRATORY_TRACT | Status: AC
  Administered 2012-03-16: 0.5 mg via RESPIRATORY_TRACT
  Filled 2012-03-16: qty 2.5

## 2012-03-16 MED ORDER — GABAPENTIN 300 MG PO CAPS
300.0000 mg | ORAL_CAPSULE | Freq: Three times a day (TID) | ORAL | Status: DC
  Administered 2012-03-16 – 2012-03-21 (×16): 300 mg via ORAL
  Filled 2012-03-16 (×18): qty 1

## 2012-03-16 MED ORDER — INSULIN GLARGINE 100 UNIT/ML ~~LOC~~ SOLN
30.0000 [IU] | Freq: Every day | SUBCUTANEOUS | Status: DC
  Administered 2012-03-16 – 2012-03-20 (×5): 30 [IU] via SUBCUTANEOUS

## 2012-03-16 MED ORDER — ALBUTEROL SULFATE (5 MG/ML) 0.5% IN NEBU: 2.5000 mg | INHALATION_SOLUTION | Freq: Once | RESPIRATORY_TRACT | Status: DC

## 2012-03-16 MED ORDER — LISINOPRIL 2.5 MG PO TABS
2.5000 mg | ORAL_TABLET | Freq: Every day | ORAL | Status: DC
  Administered 2012-03-16: 2.5 mg via ORAL
  Filled 2012-03-16: qty 1

## 2012-03-16 MED ORDER — TRAZODONE HCL 100 MG PO TABS
100.0000 mg | ORAL_TABLET | Freq: Every day | ORAL | Status: DC
  Administered 2012-03-16 – 2012-03-20 (×5): 100 mg via ORAL
  Filled 2012-03-16 (×6): qty 1

## 2012-03-16 MED ORDER — ALBUTEROL SULFATE (5 MG/ML) 0.5% IN NEBU
10.0000 mg | INHALATION_SOLUTION | Freq: Once | RESPIRATORY_TRACT | Status: AC
  Administered 2012-03-16: 10 mg via RESPIRATORY_TRACT
  Filled 2012-03-16: qty 2

## 2012-03-16 MED ORDER — METFORMIN HCL 500 MG PO TABS: 500.0000 mg | ORAL_TABLET | Freq: Every evening | ORAL | Status: DC

## 2012-03-16 MED ORDER — METHYLPREDNISOLONE SODIUM SUCC 125 MG IJ SOLR
125.0000 mg | Freq: Once | INTRAMUSCULAR | Status: AC
  Administered 2012-03-16: 125 mg via INTRAVENOUS
  Filled 2012-03-16: qty 2

## 2012-03-16 MED ORDER — INSULIN ASPART 100 UNIT/ML ~~LOC~~ SOLN
0.0000 [IU] | Freq: Every day | SUBCUTANEOUS | Status: DC
  Administered 2012-03-16: 4 [IU] via SUBCUTANEOUS
  Administered 2012-03-17: 2 [IU] via SUBCUTANEOUS
  Administered 2012-03-19: 4 [IU] via SUBCUTANEOUS

## 2012-03-16 MED ORDER — METFORMIN HCL 500 MG PO TABS
1000.0000 mg | ORAL_TABLET | Freq: Every day | ORAL | Status: DC
  Administered 2012-03-17 – 2012-03-21 (×5): 1000 mg via ORAL
  Filled 2012-03-16 (×6): qty 2

## 2012-03-16 MED ORDER — METFORMIN HCL 500 MG PO TABS
500.0000 mg | ORAL_TABLET | Freq: Every evening | ORAL | Status: DC
  Administered 2012-03-16 – 2012-03-20 (×5): 500 mg via ORAL
  Filled 2012-03-16 (×6): qty 1

## 2012-03-16 MED ORDER — MOXIFLOXACIN HCL IN NACL 400 MG/250ML IV SOLN
400.0000 mg | INTRAVENOUS | Status: DC
  Filled 2012-03-16: qty 250

## 2012-03-16 MED ORDER — TRIHEXYPHENIDYL HCL 2 MG PO TABS
1.0000 mg | ORAL_TABLET | Freq: Every day | ORAL | Status: DC
  Administered 2012-03-16 – 2012-03-20 (×5): 1 mg via ORAL
  Filled 2012-03-16 (×7): qty 1

## 2012-03-16 MED ORDER — INSULIN ASPART 100 UNIT/ML ~~LOC~~ SOLN
0.0000 [IU] | Freq: Three times a day (TID) | SUBCUTANEOUS | Status: DC
  Administered 2012-03-16: 8 [IU] via SUBCUTANEOUS
  Administered 2012-03-17: 5 [IU] via SUBCUTANEOUS
  Administered 2012-03-17 – 2012-03-18 (×3): 2 [IU] via SUBCUTANEOUS
  Administered 2012-03-18: 8 [IU] via SUBCUTANEOUS
  Administered 2012-03-18: 3 [IU] via SUBCUTANEOUS
  Administered 2012-03-19: 5 [IU] via SUBCUTANEOUS
  Administered 2012-03-19: 3 [IU] via SUBCUTANEOUS
  Administered 2012-03-20: 5 [IU] via SUBCUTANEOUS
  Administered 2012-03-20: 3 [IU] via SUBCUTANEOUS
  Administered 2012-03-20 – 2012-03-21 (×2): 2 [IU] via SUBCUTANEOUS
  Administered 2012-03-21: 3 [IU] via SUBCUTANEOUS

## 2012-03-16 MED ORDER — FLUPHENAZINE HCL 10 MG PO TABS
10.0000 mg | ORAL_TABLET | Freq: Every day | ORAL | Status: DC
  Administered 2012-03-16 – 2012-03-20 (×5): 10 mg via ORAL
  Filled 2012-03-16 (×6): qty 1

## 2012-03-16 MED ORDER — DEXTROSE 5 % IV SOLN
500.0000 mg | INTRAVENOUS | Status: DC
  Administered 2012-03-16 – 2012-03-17 (×2): 500 mg via INTRAVENOUS
  Filled 2012-03-16 (×3): qty 500

## 2012-03-16 MED ORDER — OXYCODONE-ACETAMINOPHEN 5-325 MG PO TABS
1.0000 | ORAL_TABLET | Freq: Four times a day (QID) | ORAL | Status: DC | PRN
Start: 2012-03-16 — End: 2012-03-21
  Administered 2012-03-16 – 2012-03-21 (×4): 1 via ORAL
  Filled 2012-03-16 (×4): qty 1

## 2012-03-16 MED ORDER — IPRATROPIUM BROMIDE 0.02 % IN SOLN
0.5000 mg | RESPIRATORY_TRACT | Status: DC | PRN
  Administered 2012-03-17: 0.5 mg via RESPIRATORY_TRACT
  Filled 2012-03-16: qty 2.5

## 2012-03-16 MED ORDER — ASPIRIN EC 325 MG PO TBEC
325.0000 mg | DELAYED_RELEASE_TABLET | Freq: Every day | ORAL | Status: DC
  Administered 2012-03-16 – 2012-03-21 (×6): 325 mg via ORAL
  Filled 2012-03-16 (×6): qty 1

## 2012-03-16 MED ORDER — GUAIFENESIN 100 MG/5ML PO LIQD: 200.0000 mg | ORAL | Status: DC | PRN

## 2012-03-16 MED ORDER — PANTOPRAZOLE SODIUM 40 MG PO TBEC
40.0000 mg | DELAYED_RELEASE_TABLET | Freq: Every day | ORAL | Status: DC
  Administered 2012-03-16 – 2012-03-21 (×6): 40 mg via ORAL
  Filled 2012-03-16 (×7): qty 1

## 2012-03-16 MED ORDER — METOPROLOL SUCCINATE ER 25 MG PO TB24
25.0000 mg | ORAL_TABLET | Freq: Every evening | ORAL | Status: DC
  Administered 2012-03-16 – 2012-03-17 (×2): 25 mg via ORAL
  Filled 2012-03-16 (×2): qty 1

## 2012-03-16 MED ORDER — ADULT MULTIVITAMIN W/MINERALS CH
1.0000 | ORAL_TABLET | Freq: Every day | ORAL | Status: DC
  Administered 2012-03-16 – 2012-03-21 (×6): 1 via ORAL
  Filled 2012-03-16 (×6): qty 1

## 2012-03-16 NOTE — ED Notes (Signed)
Pt is aware of the need for urine. 

## 2012-03-16 NOTE — ED Provider Notes (Signed)
History     CSN: [id number]  Arrival date & time 03/16/12  7829   First MD Initiated Contact with Patient 03/16/12 0602      6:11 AM HPI Pt reports "cold" symptoms for 2 weeks. Symptoms consist of cough, congestion, shortness of breath, wheezing, subjective fever and chest pain with coughing. Reports last night symptoms worsened and she was having a difficult breathing. Patient denies prior admission or intubations for asthma. Last Hospital stay was in December but states she lives in an assisted living facility. Patient is a 61 y.o. female presenting with wheezing. The history is provided by the patient.  Wheezing  The current episode started yesterday. The onset was gradual. The problem occurs continuously. The problem has been gradually worsening. The problem is moderate. The symptoms are relieved by beta-agonist inhalers. Associated symptoms include chest pain, a fever, cough, shortness of breath and wheezing. Pertinent negatives include no sore throat. She has inhaled smoke recently. She has had no prior hospitalizations. She has had no prior ICU admissions. She has had no prior intubations. Her past medical history is significant for asthma.    Past Medical History  Diagnosis Date  . Hypertension   . Diabetes mellitus   . Dysphagia   . TIA (transient ischemic attack)   . Genital herpes   . DM neuropathies   . Schizophrenia, simple   . Anemia   . Arthritis   . Asthma   . Stroke   . Cancer     lt breast    Past Surgical History  Procedure Date  . Cholecystectomy   . Cesarean section   . Tonsillectomy     History reviewed. No pertinent family history.  History  Substance Use Topics  . Smoking status: Current Everyday Smoker -- 0.5 packs/day  . Smokeless tobacco: Not on file  . Alcohol Use: No    OB History    Grav Para Term Preterm Abortions TAB SAB Ect Mult Living                  Review of Systems  Constitutional: Positive for fever. Negative for chills,  diaphoresis and fatigue.  HENT: Positive for congestion and postnasal drip. Negative for ear pain, sore throat, trouble swallowing, neck pain and sinus pressure.   Respiratory: Positive for cough, shortness of breath and wheezing.   Cardiovascular: Positive for chest pain. Negative for leg swelling.  Gastrointestinal: Negative for nausea, vomiting and abdominal pain.  Neurological: Negative for dizziness, weakness, numbness and headaches.  All other systems reviewed and are negative.    Allergies  Penicillins  Home Medications   Current Outpatient Rx  Name Route Sig Dispense Refill  . ACETAMINOPHEN 500 MG PO TABS Oral Take 1,000 mg by mouth every 6 (six) hours as needed. For pain    . ALBUTEROL SULFATE HFA 108 (90 BASE) MCG/ACT IN AERS Inhalation Inhale 2 puffs into the lungs every 6 (six) hours as needed. For dyspnea    . ASPIRIN EC 325 MG PO TBEC Oral Take 325 mg by mouth daily.    . ATORVASTATIN CALCIUM 10 MG PO TABS Oral Take 10 mg by mouth at bedtime.    Marland Kitchen DICLOFENAC SODIUM 75 MG PO TBEC Oral Take 75 mg by mouth 2 (two) times daily.    Marland Kitchen FLUPHENAZINE HCL 10 MG PO TABS Oral Take 10 mg by mouth at bedtime.    Marland Kitchen FLUPHENAZINE DECANOATE 25 MG/ML IJ SOLN Intramuscular Inject 25 mg into the muscle every 14 (fourteen)  days. Injection given at [facility]    . GABAPENTIN 300 MG PO CAPS Oral Take 300 mg by mouth 3 (three) times daily.    . GUAIFENESIN 100 MG/5ML PO LIQD Oral Take 200 mg by mouth every 4 (four) hours as needed. For cough    . HYDROXYZINE PAMOATE 25 MG PO CAPS Oral Take 25 mg by mouth every 4 (four) hours as needed.    . INSULIN GLARGINE 100 UNIT/ML [place] SOLN Subcutaneous Inject 30 Units into the skin at bedtime.    Marland Kitchen LISINOPRIL 2.5 MG PO TABS Oral Take 2.5 mg by mouth daily.    Marland Kitchen LOPERAMIDE HCL 2 MG PO CAPS Oral Take 2 mg by mouth 4 (four) times daily as needed. For diarrhea    . MELATONIN 1 MG PO TABS Oral Take 1 tablet by mouth at bedtime.    Marland Kitchen METFORMIN HCL 500 MG PO  TABS Oral Take 500-1,000 mg by mouth every evening. Takes 500mg  at 5pm and 1000mg  daily    . METOPROLOL SUCCINATE ER 25 MG PO TB24 Oral Take 25 mg by mouth every evening.    . ADULT MULTIVITAMIN W/MINERALS CH Oral Take 1 tablet by mouth daily.    Marland Kitchen OMEPRAZOLE 20 MG PO CPDR Oral Take 20 mg by mouth daily with breakfast.    . SOLIFENACIN SUCCINATE 10 MG PO TABS Oral Take 5 mg by mouth daily.    . TRAZODONE HCL 100 MG PO TABS Oral Take 100 mg by mouth at bedtime.    . TRIHEXYPHENIDYL HCL 2 MG PO TABS Oral Take 1 mg by mouth at bedtime.       BP 162/82  Pulse 70  Temp(Src) 98.7 F (37.1 C) (Oral)  Resp 18  SpO2 97%  Physical Exam  Vitals reviewed. Constitutional: She is oriented to person, place, and time. Vital signs are normal. She appears well-developed and well-nourished.  HENT:  Head: Normocephalic and atraumatic.  Nose: Nose normal.  Mouth/Throat: Oropharynx is clear and moist. No oropharyngeal exudate.  Eyes: Conjunctivae are normal. Pupils are equal, round, and reactive to light.  Neck: Normal range of motion. Neck supple.  Cardiovascular: Normal rate, regular rhythm and normal heart sounds.  Exam reveals no gallop and no friction rub.   No murmur heard. Pulmonary/Chest: Effort normal. She has wheezes (diffuse). She has rhonchi (diffuse). She exhibits no tenderness.  Abdominal: Normal appearance.  Musculoskeletal: Normal range of motion.  Neurological: She is alert and oriented to person, place, and time. Coordination normal.  Skin: Skin is warm and dry. No rash noted. No erythema. No pallor.     ED Course  Procedures   Results for orders placed during the hospital encounter of 03/16/12  CBC      Component Value Range   WBC 11.6 (*) 4.0 - 10.5 (K/uL)   RBC 4.72  3.87 - 5.11 (MIL/uL)   Hemoglobin 12.6  12.0 - 15.0 (g/dL)   HCT 16.1  09.6 - 04.5 (%)   MCV 83.5  78.0 - 100.0 (fL)   MCH 26.7  26.0 - 34.0 (pg)   MCHC 32.0  30.0 - 36.0 (g/dL)   RDW 40.9 (*) 81.1 - 15.5  (%)   Platelets 401 (*) 150 - 400 (K/uL)  DIFFERENTIAL      Component Value Range   Neutrophils Relative 45  43 - 77 (%)   Neutro Abs 5.2  1.7 - 7.7 (K/uL)   Lymphocytes Relative 42  12 - 46 (%)   Lymphs Abs  4.8 (*) 0.7 - 4.0 (K/uL)   Monocytes Relative 8  3 - 12 (%)   Monocytes Absolute 1.0  0.1 - 1.0 (K/uL)   Eosinophils Relative 5  0 - 5 (%)   Eosinophils Absolute 0.6  0.0 - 0.7 (K/uL)   Basophils Relative 0  0 - 1 (%)   Basophils Absolute 0.1  0.0 - 0.1 (K/uL)  PRO B NATRIURETIC PEPTIDE      Component Value Range   Pro B Natriuretic peptide (BNP) 330.3 (*) 0 - 125 (pg/mL)  BASIC METABOLIC PANEL      Component Value Range   Sodium 139  135 - 145 (mEq/L)   Potassium 4.2  3.5 - 5.1 (mEq/L)   Chloride 102  96 - 112 (mEq/L)   CO2 28  19 - 32 (mEq/L)   Glucose, Bld 76  70 - 99 (mg/dL)   BUN 9  6 - 23 (mg/dL)   Creatinine, Ser 1.61  0.50 - 1.10 (mg/dL)   Calcium 8.9  8.4 - 09.6 (mg/dL)   GFR calc non Af Amer >90  >90 (mL/min)   GFR calc Af Amer >90  >90 (mL/min)  URINALYSIS, ROUTINE W REFLEX MICROSCOPIC      Component Value Range   Color, Urine YELLOW  YELLOW    APPearance CLOUDY (*) CLEAR    Specific Gravity, Urine 1.014  1.005 - 1.030    pH 6.0  5.0 - 8.0    Glucose, UA NEGATIVE  NEGATIVE (mg/dL)   Hgb urine dipstick LARGE (*) NEGATIVE    Bilirubin Urine NEGATIVE  NEGATIVE    Ketones, ur NEGATIVE  NEGATIVE (mg/dL)   Protein, ur NEGATIVE  NEGATIVE (mg/dL)   Urobilinogen, UA 0.2  0.0 - 1.0 (mg/dL)   Nitrite NEGATIVE  NEGATIVE    Leukocytes, UA NEGATIVE  NEGATIVE   URINE MICROSCOPIC-ADD ON      Component Value Range   Squamous Epithelial / LPF MANY (*) RARE    WBC, UA 0-2  <3 (WBC/hpf)   RBC / HPF 0-2  <3 (RBC/hpf)   Bacteria, UA MANY (*) RARE   GLUCOSE, CAPILLARY      Component Value Range   Glucose-Capillary 96  70 - 99 (mg/dL)   Dg Chest 2 View  0/45/4098  *RADIOLOGY REPORT*  Clinical Data: Wheezing, cough, congestion, shortness of breath.  CHEST - 2 VIEW   Comparison: 01/22/2012  Findings: Shallow inspiration.  Mild cardiac enlargement with some prominence of pulmonary vascularity suggesting early congestion. No focal airspace consolidation.  No edema.  No blunting of costophrenic angles.  No pneumothorax.  No significant change since previous study.  IMPRESSION: Mild cardiac enlargement.  Early pulmonary vascular congestion is not excluded.  No focal consolidation.  Original Report Authenticated By: Marlon Pel, M.D.   ED ECG REPORT   Date: 03/16/2012  EKG Time: 9:07 AM  Rate: 72  Rhythm: normal sinus rhythm,  unchanged from previous tracings  Axis: nml  Intervals:none  ST&T Change: nonspecific T wave changes   MDM  6:11 AM Patient reports significant improvement with albuterol that she received in the ambulance. Will Treat with hour long of albuterol and atrovent.   8:24 AM Patient still has significant wheezing and rhonchi despite solumedrol and albuterol 1 hour neb. Will admit for an asthma exacerbation. Discussed plan with Dr. Patria Mane  8:44 AM Spoke with Dr Elisabeth Pigeon who will admit the patient for asthma exacerbation. Accepted to team 1     Thomasene Lot, PA-C 03/16/12 0845  Thomasene Lot, PA-C 03/16/12 [phone]

## 2012-03-16 NOTE — Progress Notes (Signed)
Patient arrived to 1306 at this time, alert and orientedx4,on 40% venti mask.

## 2012-03-16 NOTE — ED Notes (Signed)
Placed pt on bedpan however pt was unable to urinate at that time.

## 2012-03-16 NOTE — Evaluation (Signed)
Physical Therapy Evaluation Patient Details Name: Sherri Hays MRN: [id number] DOB: 06-Jul-1951 Today's Date: 03/16/2012 Time: 5784-6962 PT Time Calculation (min): 14 min  PT Assessment / Plan / Recommendation Clinical Impression  61 y.o. female admitted to [facility] for SOB.  She presents today with good mobility, but poor activity tolerance and increased WOB and DOE with mobility.  She would benefit from HHPT f/u for endurance training at discharge, but should be able to return to ALF at discharge.      PT Assessment  Patient needs continued PT services    Follow Up Recommendations  Home health PT;Supervision - Intermittent    Barriers to Discharge None      lEquipment Recommendations  None recommended by PT    Recommendations for Other Services   none  Frequency Min 3X/week    Precautions / Restrictions   monitor O2  Pertinent Vitals/Pain O2 Sats on 40% venti mask 98% at rest, when removed to talk on the phone for 3 mins O2 sats remained 93% on room air at rest.  With mobility, left venti mask on increased DOE 3-4/4 with bed to chair transfer, but O2 sats on venti mask stayed 93-94%.      Mobility  Bed Mobility Bed Mobility: Supine to Sit;Sitting - Scoot to Edge of Bed Supine to Sit: 6: Modified independent (Device/Increase time);With rails;HOB flat Sitting - Scoot to Edge of Bed: 7: Independent  Transfers Transfers: Sit to Stand;Stand to [employer] Transfers Sit to Stand: 5: Supervision;With upper extremity assist;From bed Stand to Sit: 5: Supervision;To chair/3-in-1;With armrests;With upper extremity assist Stand Pivot Transfers: 5: Supervision Details for Transfer Assistance: supervision for safety and line management.    Ambulation/Gait Ambulation/Gait Assistance:  (not tested.  The patient's DOE increased to 3-4/4 )     PT Diagnosis: Difficulty walking;Abnormality of gait;Generalized weakness  PT Problem List: Decreased strength;Decreased activity  tolerance;Decreased mobility;Decreased balance PT Treatment Interventions: DME instruction;Gait training;Functional mobility training;Therapeutic activities;Therapeutic exercise;Balance training;Neuromuscular re-education;Patient/family education (energy conservation techniques)   PT Goals Acute Rehab PT Goals PT Goal Formulation: With patient Time For Goal Achievement: 03/30/12 Potential to Achieve Goals: Good Pt will go Sit to Stand: with modified independence PT Goal: Sit to Stand - Progress: Goal set today Pt will go Stand to Sit: with modified independence PT Goal: Stand to Sit - Progress: Goal set today Pt will Transfer Bed to Chair/Chair to Bed: with modified independence PT Transfer Goal: Bed to Chair/Chair to Bed - Progress: Goal set today Pt will Ambulate: 51 - 150 feet;with modified independence;with least restrictive assistive device PT Goal: Ambulate - Progress: Goal set today  Visit Information  Last PT Received On: 03/16/12 Assistance Needed: +1    Subjective Data  Subjective: Pt wants to go get a cigarette Patient Stated Goal: to go home tomorrow   Prior Functioning  Home Living Lives With:  (roomate at ALF) Type of Home: Assisted living [facility]) Home Access: Level entry Home Layout: One level Prior Function Level of Independence: Needs assistance Needs Assistance: Bathing;Dressing;Light Housekeeping;Meal Prep Meal Prep: Total (goes to dining hall) Light Housekeeping: Total (staff cleans apartment) Driving: No Comments: "I walk on my own two feet.  " Communication Communication: No difficulties    Cognition  Overall Cognitive Status: Appears within functional limits for tasks assessed/performed Arousal/Alertness: Awake/alert Cognition - Other Comments: not specifically tested    Extremity/Trunk Assessment Right Lower Extremity Assessment RLE ROM/Strength/Tone: Deficits RLE ROM/Strength/Tone Deficits: grossly 3+/5 Left Lower Extremity Assessment LLE  ROM/Strength/Tone: Deficits LLE  ROM/Strength/Tone Deficits: grossly 3+/5   End of Session PT - End of Session Equipment Utilized During Treatment: Oxygen (40% venturi mask) Activity Tolerance: Patient limited by fatigue (limited by DOE) Patient left: in chair;with call bell/phone within reach   Stuart B. Hattye Siegfried, PT, DPT [phone] 03/16/2012, 4:36 PM

## 2012-03-16 NOTE — ED Notes (Signed)
First attempt to call report to floor. Floor rn will call back.  

## 2012-03-16 NOTE — ED Notes (Signed)
Per EMS: Pt assisted living facility with c/o wheezing and cold-like symptoms. Pt states cold-like symptoms began 2 weeks ago. Tonight pt began wheezing. Pt has hx asthma. Pt was given 5mg  of albuterol in route and has shown improvement however pt is still heavily congested. Pt is currently on 2L O2.

## 2012-03-16 NOTE — ED Notes (Signed)
[id number]> Expected date:<BR> Expected time:<BR> Means of arrival:<BR> Comments:<BR> EMS/from SNF-wheezing

## 2012-03-16 NOTE — ED Notes (Signed)
Report given to donna, rn on floor 

## 2012-03-16 NOTE — ED Notes (Signed)
RT called to come assess pt and determine if breathing treatment necessary at present. Crackles/ rales in lung fields heard.

## 2012-03-16 NOTE — H&P (Signed)
PCP:  Dorrene German, MD, MD   DOA:  03/16/2012  5:29 AM  Chief Complaint:  shortness of breath  HPI: 61 year old female with history of asthma and COPD, DM, HTN who presented to ED with complaints of worsening shortness of breath started on the day of admission associated with cough productive of yellow sputum and subjective fever. Patient reports trying nebulizer treatments at home with very little symptomatic improvement. Patient reports having chronic cough every few months and it may last as long as 1 month. This time her cough is definitely worse than her usual cough. Patient has no complaints of chest pain, no palpitations, no pleuritic chest pain. No complaints of abdominal pain, no nausea and/or vomiting. No reports of diarrhea, constipation or blood in stool or urine. No complaints of dysuria or urinary frequency.  Assessment/Plan  Principal Problem:   * Shortness of breath - secondary to asthma exacerbation - at this time patient feels better but still wheezing with rhonchi appreciated on lung exam - we will continue nebulizer treatments every 2 hours as needed for shortness of breath and wheezing - continue solumedrol 60 mg IV Q 12 hours - start azithromycin 500 mg IV daily for possible bronchitis - oxygen support via nasal canula to keep O2 saturation above 90%  Active Problems:  Diabetes Mellitus, uncontrolled with complications - continue insulin 30 units sub Q at bedtime - continue sliding scale insulin - continue CBG monitoring - check A1c - continue metformin per home regimen  Leukocytosis - secondary to possible bronchitis - we will start azithromycin 500 mg daily IV  Diabetic neuropathy - continue gabapentin  Hypertension, uncontrolled - BP 181/115 - we will continue home medications which includes lisinopril and low dose metoprolol - we will increase lisinopril to 10 mg a day for better BP control  Dyslipidemia - continue statin  GERD - continue  protonix 40 mg daily  DVT Prophylaxis - lovenox sub Q  Code Status - full code  Education  - test results and diagnostic studies were discussed with patient and pt's family who was present at the bedside - patient and family have verbalized the understanding - questions were answered at the bedside and contact information was provided for additional questions or concerns    Allergies: Allergies  Allergen Reactions  . Penicillins Other (See Comments)    unknown    Prior to Admission medications   Medication Sig Start Date End Date Taking? Authorizing Provider  acetaminophen (TYLENOL) 500 MG tablet Take 1,000 mg by mouth every 6 (six) hours as needed. For pain   Yes Historical Provider, MD  albuterol (PROVENTIL HFA;VENTOLIN HFA) 108 (90 BASE) MCG/ACT inhaler Inhale 2 puffs into the lungs every 6 (six) hours as needed. For dyspnea   Yes Historical Provider, MD  aspirin EC 325 MG tablet Take 325 mg by mouth daily.   Yes Historical Provider, MD  atorvastatin (LIPITOR) 10 MG tablet Take 10 mg by mouth at bedtime.   Yes Historical Provider, MD  diclofenac (VOLTAREN) 75 MG EC tablet Take 75 mg by mouth 2 (two) times daily.   Yes Historical Provider, MD  fluPHENAZine (PROLIXIN) 10 MG tablet Take 10 mg by mouth at bedtime.   Yes Historical Provider, MD  fluPHENAZine decanoate (PROLIXIN) 25 MG/ML injection Inject 25 mg into the muscle every 14 (fourteen) days. Injection given at [facility]   Yes Historical Provider, MD  gabapentin (NEURONTIN) 300 MG capsule Take 300 mg by mouth 3 (three) times daily.  Yes Historical Provider, MD  guaiFENesin (DIABETIC TUSSIN EX) 100 MG/5ML liquid Take 200 mg by mouth every 4 (four) hours as needed. For cough   Yes Historical Provider, MD  hydrOXYzine (VISTARIL) 25 MG capsule Take 25 mg by mouth every 4 (four) hours as needed.   Yes Historical Provider, MD  insulin glargine (LANTUS) 100 UNIT/ML injection Inject 30 Units into the skin at bedtime.   Yes  Historical Provider, MD  lisinopril (PRINIVIL,ZESTRIL) 2.5 MG tablet Take 2.5 mg by mouth daily.   Yes Historical Provider, MD  loperamide (IMODIUM) 2 MG capsule Take 2 mg by mouth 4 (four) times daily as needed. For diarrhea   Yes Historical Provider, MD  Melatonin 1 MG TABS Take 1 tablet by mouth at bedtime.   Yes Historical Provider, MD  metFORMIN (GLUCOPHAGE) 500 MG tablet Take 500-1,000 mg by mouth every evening. Takes 500mg  at 5pm and 1000mg  daily   Yes Historical Provider, MD  metoprolol succinate (TOPROL-XL) 25 MG 24 hr tablet Take 25 mg by mouth every evening.   Yes Historical Provider, MD  Multiple Vitamin (MULITIVITAMIN WITH MINERALS) TABS Take 1 tablet by mouth daily.   Yes Historical Provider, MD  omeprazole (PRILOSEC) 20 MG capsule Take 20 mg by mouth daily with breakfast.   Yes Historical Provider, MD  solifenacin (VESICARE) 10 MG tablet Take 5 mg by mouth daily.   Yes Historical Provider, MD  traZODone (DESYREL) 100 MG tablet Take 100 mg by mouth at bedtime.   Yes Historical Provider, MD  trihexyphenidyl (ARTANE) 2 MG tablet Take 1 mg by mouth at bedtime.    Yes Historical Provider, MD    Past Medical History  Diagnosis Date  . Hypertension   . Diabetes mellitus   . Dysphagia   . TIA (transient ischemic attack)   . Genital herpes   . DM neuropathies   . Schizophrenia, simple   . Anemia   . Arthritis   . Asthma   . Stroke   . Cancer     lt breast    Past Surgical History  Procedure Date  . Cholecystectomy   . Cesarean section   . Tonsillectomy     Social History:  reports that she has been smoking Cigarettes.  She has been smoking about .5 packs per day. She has never used smokeless tobacco. She reports that she does not drink alcohol or use illicit drugs.  History reviewed. No pertinent family history.  Review of Systems:  Constitutional: positive for subjective fever, no chills, diaphoresis, appetite change and fatigue.  HEENT: Denies photophobia, eye  pain, redness, hearing loss, ear pain, congestion, sore throat, rhinorrhea, sneezing, mouth sores, trouble swallowing, neck pain, neck stiffness and tinnitus.   Respiratory: per HPI.   Cardiovascular: Denies chest pain, palpitations and leg swelling.  Gastrointestinal: Denies nausea, vomiting, abdominal pain, diarrhea, constipation, blood in stool and abdominal distention.  Genitourinary: Denies dysuria, urgency, frequency, hematuria, flank pain and difficulty urinating.  Musculoskeletal: Denies myalgias, back pain, joint swelling, arthralgias and gait problem.  Skin: Denies pallor, rash and wound.  Neurological: Denies dizziness, seizures, syncope, weakness, light-headedness, numbness and headaches.  Hematological: Denies adenopathy. Easy bruising, personal or family bleeding history  Psychiatric/Behavioral: Denies suicidal ideation, mood changes, confusion, nervousness, sleep disturbance and agitation   Physical Exam:  Filed Vitals:   03/16/12 0942 03/16/12 1041 03/16/12 1123 03/16/12 1250  BP:  179/98 180/104 181/115  Pulse:  95  99  Temp:  98.7 F (37.1 C)  98.6 F (37  C)  TempSrc:  Oral  Oral  Resp:  20  20  Height:  5\' 3"  (1.6 m)    Weight:  92.08 kg (203 lb)    SpO2: 95% 95%  95%    Constitutional: Vital signs reviewed.  Patient is in no acute distress and cooperative with exam. Alert and oriented x3.  Head: Normocephalic and atraumatic Ear: TM normal bilaterally Mouth: no erythema or exudates, MMM Eyes: PERRL, EOMI, conjunctivae normal, No scleral icterus.  Neck: Supple, Trachea midline normal ROM, No JVD, mass, thyromegaly, or carotid bruit present.  Cardiovascular: RRR, S1 normal, S2 normal, no MRG, pulses symmetric and intact bilaterally Pulmonary/Chest: upper lung lobes wheezing and crackles appreciated bilaterally Abdominal: Soft. Non-tender, non-distended, bowel sounds are normal, no masses, organomegaly, or guarding present.  GU: no CVA tenderness Musculoskeletal:  No joint deformities, erythema, or stiffness, ROM full and no nontender Ext: no edema and no cyanosis, pulses palpable bilaterally (DP and PT) Hematology: no cervical, inginal, or axillary adenopathy.  Neurological: A&O x3, Strenght is normal and symmetric bilaterally, cranial nerve II-XII are grossly intact, no focal motor deficit, sensory intact to light touch bilaterally.  Skin: Warm, dry and intact. No rash, cyanosis, or clubbing.  Psychiatric: Normal mood and affect. speech and behavior is normal. Judgment and thought content normal. Cognition and memory are normal.   Labs on Admission:  CBC     Status: Abnormal   Collection Time   03/16/12  7:00 AM      Component Value Range Comment   WBC 11.6 (*) 4.0 - 10.5 (K/uL)    RBC 4.72  3.87 - 5.11 (MIL/uL)    Hemoglobin 12.6  12.0 - 15.0 (g/dL)    HCT 09.8  11.9 - 14.7 (%)    MCV 83.5  78.0 - 100.0 (fL)    MCH 26.7  26.0 - 34.0 (pg)    MCHC 32.0  30.0 - 36.0 (g/dL)    RDW 82.9 (*) 56.2 - 15.5 (%)    Platelets 401 (*) 150 - 400 (K/uL)   BASIC METABOLIC PANEL     Status: Normal   Collection Time   03/16/12  7:00 AM      Component Value Range Comment   Sodium 139  135 - 145 (mEq/L)    Potassium 4.2  3.5 - 5.1 (mEq/L)    Chloride 102  96 - 112 (mEq/L)    CO2 28  19 - 32 (mEq/L)    Glucose, Bld 76  70 - 99 (mg/dL)    BUN 9  6 - 23 (mg/dL)    Creatinine, Ser 1.30  0.50 - 1.10 (mg/dL)    Calcium 8.9  8.4 - 10.5 (mg/dL)    GFR calc non Af Amer >90  >90 (mL/min)    GFR calc Af Amer >90  >90 (mL/min)   URINALYSIS, ROUTINE W REFLEX MICROSCOPIC     Status: Abnormal   Collection Time   03/16/12  7:20 AM      Component Value Range Comment   Color, Urine YELLOW  YELLOW     APPearance CLOUDY (*) CLEAR     Specific Gravity, Urine 1.014  1.005 - 1.030     pH 6.0  5.0 - 8.0     Glucose, UA NEGATIVE  NEGATIVE (mg/dL)    Hgb urine dipstick LARGE (*) NEGATIVE     Bilirubin Urine NEGATIVE  NEGATIVE     Ketones, ur NEGATIVE  NEGATIVE (mg/dL)     Protein, ur NEGATIVE  NEGATIVE (  mg/dL)    Urobilinogen, UA 0.2  0.0 - 1.0 (mg/dL)    Nitrite NEGATIVE  NEGATIVE     Leukocytes, UA NEGATIVE  NEGATIVE    GLUCOSE, CAPILLARY     Status: Normal   Collection Time   03/16/12  9:04 AM      Component Value Range Comment   Glucose-Capillary 96  70 - 99 (mg/dL)   CBC     Status: Abnormal   Collection Time   03/16/12 11:57 AM      Component Value Range Comment   WBC 16.9 (*) 4.0 - 10.5 (K/uL)    RBC 4.89  3.87 - 5.11 (MIL/uL)    Hemoglobin 12.9  12.0 - 15.0 (g/dL)    HCT 21.3  08.6 - 57.8 (%)    MCV 83.4  78.0 - 100.0 (fL)    MCH 26.4  26.0 - 34.0 (pg)    MCHC 31.6  30.0 - 36.0 (g/dL)    RDW 46.9 (*) 62.9 - 15.5 (%)    Platelets 433 (*) 150 - 400 (K/uL)   COMPREHENSIVE METABOLIC PANEL     Status: Abnormal   Collection Time   03/16/12 11:57 AM      Component Value Range Comment   Sodium 142  135 - 145 (mEq/L)    Potassium 3.7  3.5 - 5.1 (mEq/L)    Chloride 102  96 - 112 (mEq/L)    CO2 25  19 - 32 (mEq/L)    Glucose, Bld 114 (*) 70 - 99 (mg/dL)    BUN 9  6 - 23 (mg/dL)    Creatinine, Ser 5.28 (*) 0.50 - 1.10 (mg/dL)    Calcium 9.3  8.4 - 10.5 (mg/dL)    Total Protein 7.7  6.0 - 8.3 (g/dL)    Albumin 3.5  3.5 - 5.2 (g/dL)    AST 23  0 - 37 (U/L)    ALT 21  0 - 35 (U/L)    Alkaline Phosphatase 73  39 - 117 (U/L)    Total Bilirubin 0.1 (*) 0.3 - 1.2 (mg/dL)    GFR calc non Af Amer >90  >90 (mL/min)    GFR calc Af Amer >90  >90 (mL/min)   MAGNESIUM     Status: Normal   Collection Time   03/16/12 11:57 AM      Component Value Range Comment   Magnesium 1.6  1.5 - 2.5 (mg/dL)   PHOSPHORUS     Status: Normal   Collection Time   03/16/12 11:57 AM      Component Value Range Comment   Phosphorus 3.5  2.3 - 4.6 (mg/dL)   DIFFERENTIAL     Status: Abnormal   Collection Time   03/16/12 11:57 AM      Component Value Range Comment   Neutrophils Relative 94 (*) 43 - 77 (%)    Neutro Abs 16.0 (*) 1.7 - 7.7 (K/uL)    Lymphocytes Relative 5 (*) 12  - 46 (%)    Lymphs Abs 0.8  0.7 - 4.0 (K/uL)    Monocytes Relative 1 (*) 3 - 12 (%)    Monocytes Absolute 0.1  0.1 - 1.0 (K/uL)    Eosinophils Relative 0  0 - 5 (%)    Eosinophils Absolute 0.0  0.0 - 0.7 (K/uL)    Basophils Relative 0  0 - 1 (%)    Basophils Absolute 0.0  0.0 - 0.1 (K/uL)   APTT     Status: Normal   Collection  Time   03/16/12 11:57 AM      Component Value Range Comment   aPTT 29  24 - 37 (seconds)     Radiological Exams on Admission: No results found.  Time Spent on Admission: Over 30 minutes  Deaveon Schoen 03/16/2012, 1:23 PM  [facility] Pager # [phone] Main Office # [phone]

## 2012-03-16 NOTE — ED Provider Notes (Signed)
Medical screening examination/treatment/procedure(s) were conducted as a shared visit with non-physician practitioner(s) and myself.  I personally evaluated the patient during the encounter  The patient presents in appears to be an asthma exacerbation.  Despite albuterol treatments in the emergency apartment she continues to be hypoxic.  She'll be admitted for ongoing treatment and evaluation by the [facility] service  Lyanne Co, MD 03/16/12 [phone]

## 2012-03-16 NOTE — ED Notes (Signed)
Pt alert and oriented x4.  Skin warm and dry. In no acute distress. Denies needs. Rn attempting second IV. RT assessing pt.

## 2012-03-17 ENCOUNTER — Inpatient Hospital Stay (HOSPITAL_COMMUNITY): Payer: Medicaid Other

## 2012-03-17 DIAGNOSIS — I10 Essential (primary) hypertension: Secondary | ICD-10-CM

## 2012-03-17 DIAGNOSIS — E118 Type 2 diabetes mellitus with unspecified complications: Secondary | ICD-10-CM

## 2012-03-17 DIAGNOSIS — D7289 Other specified disorders of white blood cells: Secondary | ICD-10-CM

## 2012-03-17 DIAGNOSIS — E1165 Type 2 diabetes mellitus with hyperglycemia: Secondary | ICD-10-CM

## 2012-03-17 DIAGNOSIS — I517 Cardiomegaly: Secondary | ICD-10-CM

## 2012-03-17 DIAGNOSIS — J45901 Unspecified asthma with (acute) exacerbation: Secondary | ICD-10-CM

## 2012-03-17 LAB — COMPREHENSIVE METABOLIC PANEL
ALT: 31 U/L (ref 0–35)
AST: 25 U/L (ref 0–37)
Albumin: 3 g/dL — ABNORMAL LOW (ref 3.5–5.2)
Alkaline Phosphatase: 66 U/L (ref 39–117)
Chloride: 100 mEq/L (ref 96–112)
Potassium: 4.1 mEq/L (ref 3.5–5.1)
Sodium: 138 mEq/L (ref 135–145)
Total Bilirubin: 0.1 mg/dL — ABNORMAL LOW (ref 0.3–1.2)

## 2012-03-17 LAB — GLUCOSE, CAPILLARY
Glucose-Capillary: 151 mg/dL — ABNORMAL HIGH (ref 70–99)
Glucose-Capillary: 131 mg/dL — ABNORMAL HIGH (ref 70–99)
Glucose-Capillary: 224 mg/dL — ABNORMAL HIGH (ref 70–99)

## 2012-03-17 LAB — BLOOD GAS, ARTERIAL
Acid-Base Excess: 3.8 mmol/L — ABNORMAL HIGH (ref 0.0–2.0)
O2 Saturation: 93 %
TCO2: 26.1 mmol/L (ref 0–100)
pCO2 arterial: 47.5 mmHg — ABNORMAL HIGH (ref 35.0–45.0)

## 2012-03-17 LAB — HEMOGLOBIN A1C
Hgb A1c MFr Bld: 6.3 % — ABNORMAL HIGH (ref <5.7)
Mean Plasma Glucose: 134 mg/dL — ABNORMAL HIGH (ref <117)

## 2012-03-17 LAB — CBC
MCH: 27 pg (ref 26.0–34.0)
MCV: 82.9 fL (ref 78.0–100.0)
Platelets: 404 10*3/uL — ABNORMAL HIGH (ref 150–400)
RDW: 16.3 % — ABNORMAL HIGH (ref 11.5–15.5)

## 2012-03-17 MED ORDER — METHYLPREDNISOLONE SODIUM SUCC 40 MG IJ SOLR
INTRAMUSCULAR | Status: AC
  Filled 2012-03-17: qty 2

## 2012-03-17 MED ORDER — HYDRALAZINE HCL 25 MG PO TABS
25.0000 mg | ORAL_TABLET | Freq: Three times a day (TID) | ORAL | Status: DC
Start: 2012-03-17 — End: 2012-03-17
  Administered 2012-03-17: 25 mg via ORAL
  Filled 2012-03-17 (×3): qty 1

## 2012-03-17 MED ORDER — DEXTROSE 5 % IV SOLN
1.0000 g | INTRAVENOUS | Status: DC
  Administered 2012-03-17 – 2012-03-19 (×3): 1 g via INTRAVENOUS
  Filled 2012-03-17 (×4): qty 10

## 2012-03-17 MED ORDER — ALBUTEROL SULFATE (5 MG/ML) 0.5% IN NEBU
2.5000 mg | INHALATION_SOLUTION | RESPIRATORY_TRACT | Status: DC
  Administered 2012-03-17 – 2012-03-20 (×21): 2.5 mg via RESPIRATORY_TRACT
  Filled 2012-03-17 (×22): qty 0.5

## 2012-03-17 MED ORDER — IPRATROPIUM BROMIDE 0.02 % IN SOLN
0.5000 mg | RESPIRATORY_TRACT | Status: DC
  Administered 2012-03-17 – 2012-03-20 (×21): 0.5 mg via RESPIRATORY_TRACT
  Filled 2012-03-17 (×22): qty 2.5

## 2012-03-17 MED ORDER — HYDRALAZINE HCL 20 MG/ML IJ SOLN
10.0000 mg | Freq: Four times a day (QID) | INTRAMUSCULAR | Status: DC | PRN
  Administered 2012-03-17: 10 mg via INTRAVENOUS
  Filled 2012-03-17: qty 1

## 2012-03-17 MED ORDER — HYDRALAZINE HCL 50 MG PO TABS
50.0000 mg | ORAL_TABLET | Freq: Three times a day (TID) | ORAL | Status: DC
  Administered 2012-03-17: 25 mg via ORAL
  Administered 2012-03-18 – 2012-03-21 (×11): 50 mg via ORAL
  Filled 2012-03-17 (×19): qty 1

## 2012-03-17 MED ORDER — METHYLPREDNISOLONE SODIUM SUCC 125 MG IJ SOLR
60.0000 mg | Freq: Four times a day (QID) | INTRAMUSCULAR | Status: DC
  Administered 2012-03-17 – 2012-03-18 (×4): 60 mg via INTRAVENOUS
  Filled 2012-03-17 (×8): qty 0.96

## 2012-03-17 MED ORDER — AMLODIPINE BESYLATE 10 MG PO TABS
10.0000 mg | ORAL_TABLET | Freq: Every day | ORAL | Status: DC
  Administered 2012-03-17 – 2012-03-21 (×5): 10 mg via ORAL
  Filled 2012-03-17 (×5): qty 1

## 2012-03-17 MED ORDER — DEXTROSE 5 % IV SOLN
500.0000 mg | INTRAVENOUS | Status: DC
  Administered 2012-03-17 – 2012-03-19 (×3): 500 mg via INTRAVENOUS
  Filled 2012-03-17 (×5): qty 500

## 2012-03-17 MED ORDER — GUAIFENESIN-CODEINE 100-10 MG/5ML PO SOLN
5.0000 mL | Freq: Four times a day (QID) | ORAL | Status: DC | PRN
  Administered 2012-03-17: 5 mL via ORAL
  Filled 2012-03-17: qty 5

## 2012-03-17 MED ORDER — ZOLPIDEM TARTRATE 5 MG PO TABS
5.0000 mg | ORAL_TABLET | Freq: Every evening | ORAL | Status: DC | PRN
  Administered 2012-03-17: 5 mg via ORAL
  Filled 2012-03-17: qty 1

## 2012-03-17 MED ORDER — LISINOPRIL 20 MG PO TABS
20.0000 mg | ORAL_TABLET | Freq: Every day | ORAL | Status: DC
  Administered 2012-03-18 – 2012-03-21 (×4): 20 mg via ORAL
  Filled 2012-03-17 (×4): qty 1

## 2012-03-17 NOTE — Progress Notes (Signed)
BP 204/67. MD notified

## 2012-03-17 NOTE — Progress Notes (Signed)
BP continues to be elevated. Scheduled and PRN meds given. MD notified.

## 2012-03-17 NOTE — Progress Notes (Signed)
  Echocardiogram 2D Echocardiogram has been performed.  Sherri Hays [name] 03/17/2012, 12:55 PM

## 2012-03-17 NOTE — Progress Notes (Signed)
Inpatient Diabetes Program Recommendations  AACE/ADA: New Consensus Statement on Inpatient Glycemic Control (2009)  Target Ranges:  Prepandial:   less than 140 mg/dL      Peak postprandial:   less than 180 mg/dL (1-2 hours)      Critically ill patients:  140 - 180 mg/dL   Reason for Visit: Results for Sherri Hays, Sherri Hays (MRN [id number]) as of 03/17/2012 09:10  Ref. Range 03/16/2012 09:04 03/16/2012 11:44 03/16/2012 16:49 03/16/2012 21:30  Glucose-Capillary Latest Range: 70-99 mg/dL 96 034 (H) 742 (H) 595 (H)    Inpatient Diabetes Program Recommendations Insulin - Meal Coverage: Add Novolog meal coverage 5 units tid with meals while on steroids HgbA1C: A1C indicates excellent glycemic control prior to admit.

## 2012-03-17 NOTE — Progress Notes (Signed)
CARE MANAGEMENT NOTE 03/17/2012  Patient:  Sherri Hays, Sherri Hays   Account Number:  [id number]  Date Initiated:  03/16/2012  Documentation initiated by:  Dhani Imel  Subjective/Objective Assessment:   61 yo female admitted 03/16/12 with asthma exacerbation     Action/Plan:   D/C when medically stable   Anticipated DC Date:  03/20/2012   Anticipated DC Plan:    In-house referral  Clinical Social Worker      DC Planning Services  CM consult      [facility]  Resumption Of Svcs/PTA Provider     DME arranged  Levan Hurst      DME agency  [facility]        Status of service:  In process, will continue to follow    Discharge Disposition:  ASSISTED LIVING    Comments:  03/17/12, Kathi Der RNC-MNN, BSN, [phone], CM received additional referral for HHPT.  Plan at this time is for pt to return to [facility] at discharge.  CM spoke with CSW to make aware of need.  Will follow. 03/16/12, Kathi Der RNC-MNN, BSN, [phone], CM received referral.  CM contacted Lucretia with DME order.

## 2012-03-17 NOTE — Progress Notes (Signed)
Patient states that she hasn't worn CPAP at home in a while and doesn't really want to wear it here. RT will continue to monitor.

## 2012-03-17 NOTE — Progress Notes (Signed)
Patient ID: Sherri Hays, female   DOB: 07/03/1951, 61 y.o.   MRN: [id number]  Assessment/Plan   Principal Problem:   * Shortness of breath  - secondary to acute asthma exacerbation  - patient with worse respiratory status today - we will continue nebulizer treatments every 2 hours changed to scheduled treatment for shortness of breath and wheezing  - continue solumedrol 60 mg IV Q 6 hours instead of Q 12 hours  - started azithromycin 500 mg IV daily for possible bronchitis however in light on new CXR finding which may be slightly worse than on admission we will initiate treatment for possible community acquired pneumonia - oxygen support via nasal canula to keep O2 saturation above 90%  - obtain ABG results - start CPAP at night time  Active Problems:   Acute exacerbation of asthma - management as above with nebulizers and steroids - CPAP at night per patient's home regimen   Community acquired pneumonia - will place pneumonia order set  - antibiotics to include ceftriaxone and azithromycin - would order CT chest without contrast to better visualize the lungs  Diabetes Mellitus, uncontrolled with complications  - continue insulin 30 units sub Q at bedtime  - continue sliding scale insulin  - continue CBG monitoring  - check A1c  - continue metformin per home regimen   Leukocytosis  - secondary to possible bronchitis  - continue azithromycin 500 mg daily IV   Diabetic neuropathy  - continue gabapentin   Hypertension, uncontrolled  - BP continues to be elevated - we will increase lisinopril to 20 mg a day for better BP control  - discontinue metoprolol as it may worsen respiratory status even though it is a low dose but still may somewhat contribute to bronchoconstriction - added hydralazine 50 mg Q 8 hours - start Norvasc 10 mg Q day  Dyslipidemia  - continue statin   GERD  - continue protonix 40 mg daily   DVT Prophylaxis - lovenox sub Q   Code Status - full  code   Education  - test results and diagnostic studies were discussed with patient - patient  verbalized the understanding  - questions were answered at the bedside and contact information was provided for additional questions or concerns   Consults to date: 1. Physical therapy - home health PT  Antibiotics: Azithromycin 03/16/2012 --> Ceftriaxone 03/17/2012 -->  Subjective: No events overnight. Patient appears more short of breath then yesterday.  Objective:  Vital signs in last 24 hours:  Filed Vitals:   03/17/12 1421 03/17/12 1516 03/17/12 1603 03/17/12 1703  BP: 204/67  195/83 189/80  Pulse:      Temp:      TempSrc:      Resp:      Height:      Weight:      SpO2:  98%      Intake/Output from previous day:   Intake/Output Summary (Last 24 hours) at 03/17/12 1743 Last data filed at 03/17/12 1356  Gross per 24 hour  Intake    720 ml  Output   1800 ml  Net  -1080 ml    Physical Exam: General: Alert, awake, oriented x3, in no acute distress. HEENT: No bruits, no goiter. Moist mucous membranes, no scleral icterus, no conjunctival pallor. Heart: Regular rate and rhythm, S1/S2 +, no murmurs, rubs, gallops. Lungs: rhonchi appreciated over upper lung lobes bilaterally with wheezing  Abdomen: Soft, nontender, nondistended, positive bowel sounds. Extremities: No clubbing or cyanosis, no  pitting edema,  positive pedal pulses. Neuro: Grossly nonfocal.  Lab Results:  Lab 03/17/12 0410 03/23/12 1157 03/23/2012 0700  WBC 20.2* 16.9* 11.6*  HGB 11.7* 12.9 12.6  HCT 35.9* 40.8 39.4  PLT 404* 433* 401*  MCV 82.9 83.4 83.5    Lab 03/17/12 0410 2012/03/23 1157 Mar 23, 2012 0700  NA 138 142 139  K 4.1 3.7 4.2  CL 100 102 102  CO2 27 25 28   GLUCOSE 155* 114* 76  BUN 13 9 9   CREATININE 0.52 0.47* 0.54  CALCIUM 8.9 9.3 8.9    Recent Results (from the past 240 hour(s))  MRSA PCR SCREENING     Status: Normal   Collection Time   03/17/12 12:20 AM      Component Value Range  Status Comment   MRSA by PCR NEGATIVE  NEGATIVE  Final     Studies/Results: Dg Chest 2 View 2012/03/23 IMPRESSION: Mild cardiac enlargement.  Early pulmonary vascular congestion is not excluded.  No focal consolidation.    Dg Chest Port 1 View 03/17/2012 IMPRESSION: New bilateral infrahilar opacity, most resembles atelectasis but could reflect respiratory infection.     Medications: Scheduled Meds:   . albuterol  2.5 mg Nebulization Q4H  . amLODipine  10 mg Oral Daily  . aspirin EC  325 mg Oral Daily  . atorvastatin  10 mg Oral QHS  . azithromycin  500 mg Intravenous Q24H  . darifenacin  7.5 mg Oral Daily  . dextromethorphan-guaiFENesin  1 tablet Oral BID  . diclofenac  75 mg Oral BID  . enoxaparin  40 mg Subcutaneous Q24H  . fluPHENAZine  10 mg Oral QHS  . gabapentin  300 mg Oral TID  . hydrALAZINE  50 mg Oral Q8H  . insulin aspart  0-15 Units Subcutaneous TID WC  . insulin aspart  0-5 Units Subcutaneous QHS  . insulin glargine  30 Units Subcutaneous QHS  . ipratropium  0.5 mg Nebulization Q4H  . lisinopril  20 mg Oral Daily  . metFORMIN  1,000 mg Oral Q breakfast  . metFORMIN  500 mg Oral QPM  . methylPREDNISolone (SOLU-MEDROL) injection  60 mg Intravenous Q6H  . mulitivitamin with minerals  1 tablet Oral Daily  . pantoprazole  40 mg Oral Q1200  . traZODone  100 mg Oral QHS  . trihexyphenidyl  1 mg Oral QHS   Continuous Infusions:  PRN Meds:.hydrALAZINE, hydrOXYzine, oxyCODONE-acetaminophen, DISCONTD: albuterol, DISCONTD: ipratropium   LOS: 1 day   Denym Christenberry 03/17/2012, 5:43 PM  [facility] Pager: [phone]

## 2012-03-18 DIAGNOSIS — J45901 Unspecified asthma with (acute) exacerbation: Secondary | ICD-10-CM

## 2012-03-18 DIAGNOSIS — I10 Essential (primary) hypertension: Secondary | ICD-10-CM

## 2012-03-18 DIAGNOSIS — D7289 Other specified disorders of white blood cells: Secondary | ICD-10-CM

## 2012-03-18 DIAGNOSIS — E1165 Type 2 diabetes mellitus with hyperglycemia: Secondary | ICD-10-CM

## 2012-03-18 DIAGNOSIS — E118 Type 2 diabetes mellitus with unspecified complications: Secondary | ICD-10-CM

## 2012-03-18 LAB — GLUCOSE, CAPILLARY
Glucose-Capillary: 153 mg/dL — ABNORMAL HIGH (ref 70–99)
Glucose-Capillary: 278 mg/dL — ABNORMAL HIGH (ref 70–99)
Glucose-Capillary: 140 mg/dL — ABNORMAL HIGH (ref 70–99)

## 2012-03-18 MED ORDER — METHYLPREDNISOLONE SODIUM SUCC 125 MG IJ SOLR
80.0000 mg | Freq: Three times a day (TID) | INTRAMUSCULAR | Status: DC
  Administered 2012-03-18 – 2012-03-20 (×7): 80 mg via INTRAVENOUS
  Filled 2012-03-18 (×12): qty 1.28

## 2012-03-18 NOTE — Progress Notes (Signed)
Patient ID: Sherri Hays, female   DOB: [birth year], 61 y.o.   MRN: [id number]  Assessment/Plan   Principal Problem:   * Shortness of breath  - secondary to acute asthma exacerbation  - patient respiratory status better today although still congested  - we will continue nebulizer treatments every 2 hours changed to scheduled treatment for shortness of breath and wheezing  - continue solumedrol 80 mg IV Q 8 hours - started azithromycin 500 mg IV daily for possible bronchitis however in light on new CXR finding which may be slightly worse than on admission we initiated treatment for possible community acquired pneumonia  - oxygen support via nasal canula to keep O2 saturation above 90%  - ABG results with respiratory acidosis although mild - started CPAP at night time   Active Problems:   Acute exacerbation of asthma  - management as above with nebulizers and steroids  - CPAP at night per patient's home regimen   Community acquired pneumonia  - will place pneumonia order set  - antibiotics to include ceftriaxone and azithromycin  - CT chest findings indicated below  Diabetes Mellitus, uncontrolled with complications  - continue insulin 30 units sub Q at bedtime  - continue sliding scale insulin  - continue CBG monitoring  - check A1c  - continue metformin per home regimen   Leukocytosis  - secondary to possible bronchitis  - continue azithromycin 500 mg daily IV   Diabetic neuropathy  - continue gabapentin   Hypertension, uncontrolled  - BP continues to be elevated  - we ncreased lisinopril to 40 mg a day for better BP control  - discontinued metoprolol as it may worsen respiratory status even though it is a low dose but still may somewhat contribute to bronchoconstriction  - added hydralazine 50 mg Q 8 hours but patient's blood pressure is still resistant to this treatment - started Norvasc 10 mg Q day   Dyslipidemia  - continue statin   GERD  - continue protonix 40 mg  daily   DVT Prophylaxis - lovenox sub Q   Code Status - full code   Education  - test results and diagnostic studies were discussed with patient  - patient verbalized the understanding  - questions were answered at the bedside and contact information was provided for additional questions or concerns   Consults to date:  1. Physical therapy - home health PT  2. Pulmonary - phone call.  Antibiotics:  Azithromycin 03/16/2012 -->  Ceftriaxone 03/17/2012 -->  Subjective: No events overnight. Patient feels little bit better today.  Objective:  Vital signs in last 24 hours:  Filed Vitals:   03/18/12 0752 03/18/12 1156 03/18/12 1306 03/18/12 1337  BP:   161/82 176/75  Pulse:    85  Temp:    98.8 F (37.1 C)  TempSrc:    Oral  Resp:    18  Height:      Weight:      SpO2: 98% 92%  98%    Intake/Output from previous day:   Intake/Output Summary (Last 24 hours) at 03/18/12 1617 Last data filed at 03/18/12 0900  Gross per 24 hour  Intake    360 ml  Output   2600 ml  Net  -2240 ml    Physical Exam: General: Alert, awake, oriented x3, in no acute distress. HEENT: No bruits, no goiter. Moist mucous membranes, no scleral icterus, no conjunctival pallor. Heart: Regular rate and rhythm, S1/S2 +, no murmurs, rubs, gallops. Lungs: rhonchi  and rales appreciated over upper and mid lung lobes, wheezing imroving.  Abdomen: Soft, nontender, nondistended, positive bowel sounds. Extremities: No clubbing or cyanosis, no pitting edema,  positive pedal pulses. Neuro: Grossly nonfocal.  Lab Results:  Lab 03/17/12 0410 03/16/12 1157 03/16/12 0700  WBC 20.2* 16.9* 11.6*  HGB 11.7* 12.9 12.6  HCT 35.9* 40.8 39.4  PLT 404* 433* 401*  MCV 82.9 83.4 83.5    Lab 03/17/12 0410 03/16/12 1157 03/16/12 0700  NA 138 142 139  K 4.1 3.7 4.2  CL 100 102 102  CO2 27 25 28   GLUCOSE 155* 114* 76  BUN 13 9 9   CREATININE 0.52 0.47* 0.54  CALCIUM 8.9 9.3 8.9  MG -- 1.6 --    Recent Results  (from the past 240 hour(s))  MRSA PCR SCREENING     Status: Normal   Collection Time   03/17/12 12:20 AM      Component Value Range Status Comment   MRSA by PCR NEGATIVE  NEGATIVE  Final     Studies/Results: Ct Chest Wo Contrast 03/18/2012  IMPRESSION: Bronchitic changes with mucus plugs in the bronchi to the posterior medial aspects of both lower lobes.  Faint bilateral upper lobe perihilar infiltrates.    Dg Chest Port 1 View 03/17/2012    IMPRESSION: New bilateral infrahilar opacity, most resembles atelectasis but could reflect respiratory infection.     Medications: Scheduled Meds:   . albuterol  2.5 mg Nebulization Q4H  . amLODipine  10 mg Oral Daily  . aspirin EC  325 mg Oral Daily  . atorvastatin  10 mg Oral QHS  . azithromycin  500 mg Intravenous Q24H  . cefTRIAXone (ROCEPHIN)  IV  1 g Intravenous Q24H  . darifenacin  7.5 mg Oral Daily  . dextromethorphan-guaiFENesin  1 tablet Oral BID  . diclofenac  75 mg Oral BID  . enoxaparin  40 mg Subcutaneous Q24H  . fluPHENAZine  10 mg Oral QHS  . gabapentin  300 mg Oral TID  . hydrALAZINE  50 mg Oral Q8H  . insulin aspart  0-15 Units Subcutaneous TID WC  . insulin aspart  0-5 Units Subcutaneous QHS  . insulin glargine  30 Units Subcutaneous QHS  . ipratropium  0.5 mg Nebulization Q4H  . lisinopril  20 mg Oral Daily  . metFORMIN  1,000 mg Oral Q breakfast  . metFORMIN  500 mg Oral QPM  . methylPREDNISolone (SOLU-MEDROL) injection  80 mg Intravenous TID  . methylPREDNISolone sodium succinate      . mulitivitamin with minerals  1 tablet Oral Daily  . pantoprazole  40 mg Oral Q1200  . traZODone  100 mg Oral QHS  . trihexyphenidyl  1 mg Oral QHS   Continuous Infusions:  PRN Meds:.guaiFENesin-codeine, hydrALAZINE, hydrOXYzine, oxyCODONE-acetaminophen, zolpidem   LOS: 2 days   Ori Trejos 03/18/2012, 4:17 PM  [facility] Pager: [phone]

## 2012-03-18 NOTE — Progress Notes (Signed)
Pt continues to refuse CPAP machine at night.  Pt encouraged to notify RN/ RT of any concerns.

## 2012-03-19 DIAGNOSIS — J45901 Unspecified asthma with (acute) exacerbation: Secondary | ICD-10-CM

## 2012-03-19 DIAGNOSIS — D7289 Other specified disorders of white blood cells: Secondary | ICD-10-CM

## 2012-03-19 DIAGNOSIS — I10 Essential (primary) hypertension: Secondary | ICD-10-CM

## 2012-03-19 DIAGNOSIS — E118 Type 2 diabetes mellitus with unspecified complications: Secondary | ICD-10-CM

## 2012-03-19 DIAGNOSIS — E1165 Type 2 diabetes mellitus with hyperglycemia: Secondary | ICD-10-CM

## 2012-03-19 LAB — GLUCOSE, CAPILLARY
Glucose-Capillary: 185 mg/dL — ABNORMAL HIGH (ref 70–99)
Glucose-Capillary: 172 mg/dL — ABNORMAL HIGH (ref 70–99)

## 2012-03-19 NOTE — Progress Notes (Signed)
Spoke with pt & friend at bedside. They say she lives in an assisted living facility & doesn't wear a cpap at night. Pt doesn't want to wear cpap while in hospital.  Jacqulynn Cadet RRT

## 2012-03-19 NOTE — Progress Notes (Signed)
Patient ID: Sherri Hays, female   DOB: Jun 04, 1951, 61 y.o.   MRN: [id number]  Assessment/Plan   Principal Problem:   * Shortness of breath  - secondary to acute asthma exacerbation  - patient respiratory status better today - we will continue nebulizer treatments every 2 hours  for shortness of breath and wheezing  - continue solumedrol 80 mg IV Q 8 hours  - started azithromycin 500 mg IV daily for possible bronchitis however in light on CXR findings which may be slightly worse than on admission we initiated treatment for possible community acquired pneumonia  - oxygen support via nasal canula to keep O2 saturation above 90%  - ABG results with respiratory acidosis although mild  - started CPAP at night time   Active Problems:   Acute exacerbation of asthma  - management as above with nebulizers and steroids  - CPAP at night per patient's home regimen   Community acquired pneumonia  -  pneumonia order set placed - antibiotics to include ceftriaxone and azithromycin  - CT chest findings indicated below   Diabetes Mellitus, uncontrolled with complications  - continue insulin 30 units sub Q at bedtime  - continue sliding scale insulin  - continue CBG monitoring  - check A1c  - continue metformin per home regimen   Leukocytosis  - secondary to possible bronchitis  - continue azithromycin 500 mg daily IV and ceftriaxone IV  Diabetic neuropathy  - continue gabapentin   Hypertension, uncontrolled  - BP continues to be elevated  - we ncreased lisinopril to 40 mg a day for better BP control  - discontinued metoprolol as it may worsen respiratory status even though it is a low dose but still may somewhat contribute to bronchoconstriction  - added hydralazine 50 mg Q 8 hours but patient's blood pressure is still resistant to this treatment  - started Norvasc 10 mg Q day   Dyslipidemia  - continue statin   GERD  - continue protonix 40 mg daily   DVT Prophylaxis - lovenox sub  Q   Code Status - full code   Education  - test results and diagnostic studies were discussed with patient  - patient verbalized the understanding  - questions were answered at the bedside and contact information was provided for additional questions or concerns   Consults to date:  1. Physical therapy - home health PT  2. Pulmonary - phone call.   Antibiotics:  Azithromycin 03/16/2012 -->  Ceftriaxone 03/17/2012 -->  Subjective: No events overnight. Patient reports feeling better today.  Objective:  Vital signs in last 24 hours:   03/19/12 0515 03/19/12 0749 03/19/12 1050  BP: 163/81  167/80  Pulse: 80  86  Temp: 98.6 F (37 C)  98.3 F (36.8 C)  TempSrc: Oral  Axillary  Resp: 20  20  Weight: 93.3 kg (205 lb 11 oz)    SpO2: 99% 98% 94%    Intake/Output from previous day:   Gross per 24 hour  Intake    240 ml  Output   2850 ml  Net  -2610 ml    Physical Exam: General: Alert, awake, oriented x3, in no acute distress. HEENT: No bruits, no goiter. Moist mucous membranes, no scleral icterus, no conjunctival pallor. Heart: Regular rate and rhythm, S1/S2 +, no murmurs, rubs, gallops. Lungs: rhonchi still appreciated bilaterally over upper lung lobes Abdomen: Soft, nontender, nondistended, positive bowel sounds. Extremities: No clubbing or cyanosis, no pitting edema,  positive pedal pulses. Neuro: Grossly  nonfocal.  Lab Results:  Lab 03/17/12 0410 03/16/12 1157 03/16/12 0700  WBC 20.2* 16.9* 11.6*  HGB 11.7* 12.9 12.6  HCT 35.9* 40.8 39.4  PLT 404* 433* 401*  MCV 82.9 83.4 83.5    Lab 03/17/12 0410 03/16/12 1157 03/16/12 0700  NA 138 142 139  K 4.1 3.7 4.2  CL 100 102 102  CO2 27 25 28   GLUCOSE 155* 114* 76  BUN 13 9 9   CREATININE 0.52 0.47* 0.54  CALCIUM 8.9 9.3 8.9    Recent Results (from the past 240 hour(s))  MRSA PCR SCREENING     Status: Normal   Collection Time   03/17/12 12:20 AM      Component Value Range Status Comment   MRSA by PCR  NEGATIVE  NEGATIVE  Final     Studies/Results: Ct Chest Wo Contrast 03/18/2012 IMPRESSION: Bronchitic changes with mucus plugs in the bronchi to the posterior medial aspects of both lower lobes.  Faint bilateral upper lobe perihilar infiltrates.    Medications: Scheduled Meds:   . albuterol  2.5 mg Nebulization Q4H  . amLODipine  10 mg Oral Daily  . aspirin EC  325 mg Oral Daily  . atorvastatin  10 mg Oral QHS  . azithromycin  500 mg Intravenous Q24H  . cefTRIAXone (ROCEPHIN)  IV  1 g Intravenous Q24H  . darifenacin  7.5 mg Oral Daily  . dextromethorphan-guaiFENesin  1 tablet Oral BID  . diclofenac  75 mg Oral BID  . enoxaparin  40 mg Subcutaneous Q24H  . fluPHENAZine  10 mg Oral QHS  . gabapentin  300 mg Oral TID  . hydrALAZINE  50 mg Oral Q8H  . insulin aspart  0-15 Units Subcutaneous TID WC  . insulin aspart  0-5 Units Subcutaneous QHS  . insulin glargine  30 Units Subcutaneous QHS  . ipratropium  0.5 mg Nebulization Q4H  . lisinopril  20 mg Oral Daily  . metFORMIN  1,000 mg Oral Q breakfast  . metFORMIN  500 mg Oral QPM  . methylPREDNISolone (SOLU-MEDROL) injection  80 mg Intravenous TID  . mulitivitamin with minerals  1 tablet Oral Daily  . pantoprazole  40 mg Oral Q1200  . traZODone  100 mg Oral QHS  . trihexyphenidyl  1 mg Oral QHS   Continuous Infusions:  PRN Meds:.guaiFENesin-codeine, hydrALAZINE, hydrOXYzine, oxyCODONE-acetaminophen, zolpidem   LOS: 3 days   Kayan Blissett 03/19/2012, 1:45 PM  [facility] Pager: [phone]

## 2012-03-20 DIAGNOSIS — D7289 Other specified disorders of white blood cells: Secondary | ICD-10-CM

## 2012-03-20 DIAGNOSIS — E118 Type 2 diabetes mellitus with unspecified complications: Secondary | ICD-10-CM

## 2012-03-20 DIAGNOSIS — E1165 Type 2 diabetes mellitus with hyperglycemia: Secondary | ICD-10-CM

## 2012-03-20 DIAGNOSIS — J45901 Unspecified asthma with (acute) exacerbation: Secondary | ICD-10-CM

## 2012-03-20 DIAGNOSIS — I10 Essential (primary) hypertension: Secondary | ICD-10-CM

## 2012-03-20 LAB — CBC
MCH: 26.1 pg (ref 26.0–34.0)
MCV: 82.6 fL (ref 78.0–100.0)
Platelets: 443 10*3/uL — ABNORMAL HIGH (ref 150–400)
RBC: 4.49 MIL/uL (ref 3.87–5.11)
RDW: 16.3 % — ABNORMAL HIGH (ref 11.5–15.5)

## 2012-03-20 LAB — BASIC METABOLIC PANEL
CO2: 27 mEq/L (ref 19–32)
Calcium: 8.9 mg/dL (ref 8.4–10.5)
Creatinine, Ser: 0.53 mg/dL (ref 0.50–1.10)

## 2012-03-20 LAB — GLUCOSE, CAPILLARY: Glucose-Capillary: 111 mg/dL — ABNORMAL HIGH (ref 70–99)

## 2012-03-20 MED ORDER — ALBUTEROL SULFATE HFA 108 (90 BASE) MCG/ACT IN AERS: 2.0000 | INHALATION_SPRAY | Freq: Four times a day (QID) | RESPIRATORY_TRACT | Status: AC | PRN

## 2012-03-20 MED ORDER — FLUTICASONE-SALMETEROL 250-50 MCG/DOSE IN AEPB: 1.0000 | INHALATION_SPRAY | Freq: Two times a day (BID) | RESPIRATORY_TRACT | Status: DC

## 2012-03-20 MED ORDER — OXYCODONE-ACETAMINOPHEN 5-325 MG PO TABS: 1.0000 | ORAL_TABLET | Freq: Four times a day (QID) | ORAL | Status: AC | PRN

## 2012-03-20 MED ORDER — IPRATROPIUM BROMIDE 0.02 % IN SOLN: 0.5000 mg | RESPIRATORY_TRACT | Status: DC

## 2012-03-20 MED ORDER — HYDRALAZINE HCL 50 MG PO TABS: 50.0000 mg | ORAL_TABLET | Freq: Three times a day (TID) | ORAL | Status: DC

## 2012-03-20 MED ORDER — PREDNISONE 50 MG PO TABS: 50.0000 mg | ORAL_TABLET | Freq: Every day | ORAL | Status: DC

## 2012-03-20 MED ORDER — PREDNISONE 50 MG PO TABS
50.0000 mg | ORAL_TABLET | Freq: Once | ORAL | Status: AC
  Administered 2012-03-20: 50 mg via ORAL
  Filled 2012-03-20: qty 1

## 2012-03-20 MED ORDER — MOXIFLOXACIN HCL 400 MG PO TABS: 400.0000 mg | ORAL_TABLET | Freq: Every day | ORAL | Status: AC

## 2012-03-20 MED ORDER — MOXIFLOXACIN HCL 400 MG PO TABS
400.0000 mg | ORAL_TABLET | Freq: Every day | ORAL | Status: DC
  Filled 2012-03-20: qty 1

## 2012-03-20 MED ORDER — AMLODIPINE BESYLATE 10 MG PO TABS: 10.0000 mg | ORAL_TABLET | Freq: Every day | ORAL | Status: DC

## 2012-03-20 MED ORDER — ALBUTEROL SULFATE (5 MG/ML) 0.5% IN NEBU: 2.5000 mg | INHALATION_SOLUTION | RESPIRATORY_TRACT | Status: DC

## 2012-03-20 MED ORDER — GUAIFENESIN 100 MG/5ML PO LIQD: 200.0000 mg | ORAL | Status: DC | PRN

## 2012-03-20 NOTE — Progress Notes (Signed)
Pt refused chest vest therapy and cpap.  Pt stated she just wants to sleep tonight.  Pt advised that RT available all night should she change her mind, to let her nurse know.  RN aware.

## 2012-03-20 NOTE — Progress Notes (Signed)
Physical Therapy Treatment Patient Details Name: Sherri Hays MRN: [id number] DOB: 03/21/1951 Today's Date: 03/20/2012 Time: 4098-1191 PT Time Calculation (min): 33 min  PT Assessment / Plan / Recommendation Comments on Treatment Session  Pt improved in sit to stand an gait with RW. She currently need O2 support for activity, but is doing better wirh RA at rest. Pt reports she needs help with bed mobility, but she did not want to pracitice today.  She will need continued PT and likely O2  at ALF.     Follow Up Recommendations  Home health PT;Supervision - Intermittent    Barriers to Discharge        Equipment Recommendations  Rolling walker with 5" wheels (RW in room.. question is this patients?)    Recommendations for Other Services OT consult  Frequency Min 3X/week   Plan Discharge plan remains appropriate;Frequency remains appropriate    Precautions / Restrictions Precautions Precaution Comments: monitor O2 Restrictions Weight Bearing Restrictions: No   Pertinent Vitals/Pain Pt with O2 sats to 82% on RA with sit to stand activilty.   Sats  93% on 2L with ambulation  At rest O2 sats 92% on RA    Mobility  Bed Mobility Details for Bed Mobility Assistance: pt reports she had trouble getting out of bed this moring. Offerred to practice bed mobility and work on strategies, but she deferred "its too hard" Transfers Transfers: Sit to Stand;Stand to Sit Sit to Stand: 5: Supervision;With upper extremity assist;From chair/3-in-1 Stand to Sit: 5: Supervision;To chair/3-in-1;With armrests;With upper extremity assist Details for Transfer Assistance: supervision for safety and line management.   (pt tends to be somewhat impulsive) Ambulation/Gait Ambulation/Gait Assistance: 5: Supervision (not tested.  The patient's DOE increased to 3-4/4 ) Ambulation Distance (Feet): 450 Feet (200, rest, then 250) Assistive device: Rolling walker Ambulation/Gait Assistance Details: gait slow, but  steady with RW except for assist needed for turns Gait Pattern: Step-through pattern Gait velocity: slow General Gait Details: needs 2L O2 for activty. Stairs: No Corporate treasurer: No    Exercises General Exercises - Lower Extremity Gluteal Sets: AROM;5 reps;Standing Long Arc Quad: Both;Seated;10 reps;AROM       PT Goals Acute Rehab PT Goals PT Goal Formulation: With patient Time For Goal Achievement: 03/30/12 Potential to Achieve Goals: Good Pt will go Sit to Stand: with modified independence PT Goal: Sit to Stand - Progress: Progressing toward goal Pt will go Stand to Sit: with modified independence PT Goal: Stand to Sit - Progress: Progressing toward goal Pt will Transfer Bed to Chair/Chair to Bed: with modified independence Pt will Ambulate: 51 - 150 feet;with modified independence;with least restrictive assistive device PT Goal: Ambulate - Progress: Progressing toward goal  Visit Information  Last PT Received On: 03/20/12 Assistance Needed: +1    Subjective Data  Subjective: pt does not want to go to rehab Patient Stated Goal: to go home tomorrow   Cognition  Overall Cognitive Status: Appears within functional limits for tasks assessed/performed Arousal/Alertness: Awake/alert Cognition - Other Comments: not specifically tested    Balance  Balance Balance Assessed: No  End of Session PT - End of Session Equipment Utilized During Treatment: Oxygen (40% venturi mask) Activity Tolerance: Patient limited by fatigue (limited by DOE) Patient left: in chair;with call bell/phone within reach Nurse Communication: Mobility status    Donnetta Hail 03/20/2012, 11:25 AM

## 2012-03-20 NOTE — Progress Notes (Signed)
Removed patient's urinary cathter at 1350. Patient voided 100 cc's post removal. Will continue to monitor throughout shift

## 2012-03-20 NOTE — Progress Notes (Signed)
Inpatient Diabetes Program  Pt eating 100%.  Continues with elevated CBGs. Results for CRESCENT, GOTHAM (MRN [id number]) as of 03/20/2012 12:27  Ref. Range 03/19/2012 16:48 03/19/2012 20:53 03/20/2012 07:43  Glucose-Capillary Latest Range: 70-99 mg/dL 086 (H) 578 (H) 469 (H)    Recommend:  Needs meal coverage insulin.  Add Novolog 5 units tidwc.  Thank you.

## 2012-03-20 NOTE — Discharge Summary (Signed)
Patient ID: Sherri Hays MRN: [id number] DOB/AGE: April 01, 1951 61 y.o.  Admit date: 03/16/2012 Discharge date: 03/20/2012  Primary Care Physician:  Dorrene German, MD, MD  Assessment/Plan   Principal Problem:   * Shortness of breath  - secondary to acute asthma exacerbation  - patient respiratory status significantly better today  - we will continue nebulizer treatments PRN for shortness of breath and wheezing on discharge - I have prescribed advair 250/50 Q 12 hours as patient needs this for better asthma control - she can continue taking prednisone 50 mg daily for next 5 days  - received solumedrol 80 mg IV Q 8 hours while inpatient - started azithromycin 500 mg IV daily for possible bronchitis however in light of CXR findings which may be slightly worse than on admission we initiated treatment for possible community acquired pneumonia with addition of ceftriaxone - we will discharge patient with avelox for 7 days - oxygen support via nasal canula to keep O2 saturation above 90%  - ABG results with respiratory acidosis although mild  - patient initially reported she uses CPAP at night but today she reported she actually did not so she does not need to continue CPAP in ALF - will contact respiratory care for chest PT and flutter valve to be used in ALF to better mobilize secretions - we will forward D/C summary to patient PCP to follow up on Aspergillus galactomanan results  Active Problems:   Acute exacerbation of asthma  - management as above with nebulizers and steroids   Community acquired pneumonia  - pneumonia order set placed  - antibiotics to include ceftriaxone and azithromycin  - CT chest findings indicated below  - continue avelox on discharge   Diabetes Mellitus, uncontrolled with complications  - continue insulin 30 units sub Q at bedtime  - continue sliding scale insulin  - continue CBG monitoring: 153, 312, 203, 111 - check A1c 6.3 - continue metformin per  home regimen   Leukocytosis  - secondary to possible bronchitis and steroids - continue azithromycin 500 mg daily IV and ceftriaxone IV   Diabetic neuropathy  - continue gabapentin   Hypertension, uncontrolled   - we ncreased lisinopril to 40 mg a day for better BP control  - discontinued metoprolol as it may worsen respiratory status even though it is a low dose but still may somewhat contribute to bronchoconstriction  - added hydralazine 50 mg Q 8 hours  - started Norvasc 10 mg Q day   Dyslipidemia  - continue statin   GERD  - continue protonix 40 mg daily   DVT Prophylaxis - lovenox sub Q   Code Status - full code   Education  - test results and diagnostic studies were discussed with patient  - patient verbalized the understanding  - questions were answered at the bedside and contact information was provided for additional questions or concerns   Consults to date:  1. Physical therapy - home health PT  2. Pulmonary - phone call.   Antibiotics:  Azithromycin 03/16/2012 --> 03/20/2012  Ceftriaxone 03/17/2012 --> 03/20/2012 Avelox on discharge for 7 days   Medication List  As of 03/20/2012  2:20 PM   STOP taking these medications         metoprolol succinate 25 MG 24 hr tablet         TAKE these medications         acetaminophen 500 MG tablet   Commonly known as: TYLENOL   Take 1,000 mg by  mouth every 6 (six) hours as needed. For pain      albuterol 108 (90 BASE) MCG/ACT inhaler   Commonly known as: PROVENTIL HFA;VENTOLIN HFA   Inhale 2 puffs into the lungs every 6 (six) hours as needed. For dyspnea      albuterol (5 MG/ML) 0.5% nebulizer solution   Commonly known as: PROVENTIL   Take 0.5 mLs (2.5 mg total) by nebulization every 4 (four) hours.      amLODipine 10 MG tablet   Commonly known as: NORVASC   Take 1 tablet (10 mg total) by mouth daily.      aspirin EC 325 MG tablet   Take 325 mg by mouth daily.      atorvastatin 10 MG tablet   Commonly known  as: LIPITOR   Take 10 mg by mouth at bedtime.      diclofenac 75 MG EC tablet   Commonly known as: VOLTAREN   Take 75 mg by mouth 2 (two) times daily.      fluPHENAZine 10 MG tablet   Commonly known as: PROLIXIN   Take 10 mg by mouth at bedtime.      fluPHENAZine decanoate 25 MG/ML injection   Commonly known as: PROLIXIN   Inject 25 mg into the muscle every 14 (fourteen) days. Injection given at [facility]      Fluticasone-Salmeterol 250-50 MCG/DOSE Aepb   Commonly known as: ADVAIR   Inhale 1 puff into the lungs 2 (two) times daily.      gabapentin 300 MG capsule   Commonly known as: NEURONTIN   Take 300 mg by mouth 3 (three) times daily.      guaiFENesin 100 MG/5ML liquid   Commonly known as: ROBITUSSIN   Take 10 mLs (200 mg total) by mouth every 4 (four) hours as needed for cough. For cough      hydrALAZINE 50 MG tablet   Commonly known as: APRESOLINE   Take 1 tablet (50 mg total) by mouth every 8 (eight) hours.      hydrOXYzine 25 MG capsule   Commonly known as: VISTARIL   Take 25 mg by mouth every 4 (four) hours as needed.      insulin glargine 100 UNIT/ML injection   Commonly known as: LANTUS   Inject 30 Units into the skin at bedtime.      ipratropium 0.02 % nebulizer solution   Commonly known as: ATROVENT   Take 2.5 mLs (0.5 mg total) by nebulization every 4 (four) hours.      lisinopril 2.5 MG tablet   Commonly known as: PRINIVIL,ZESTRIL   Take 2.5 mg by mouth daily.      loperamide 2 MG capsule   Commonly known as: IMODIUM   Take 2 mg by mouth 4 (four) times daily as needed. For diarrhea      Melatonin 1 MG Tabs   Take 1 tablet by mouth at bedtime.      metFORMIN 500 MG tablet   Commonly known as: GLUCOPHAGE   Take 500-1,000 mg by mouth every evening. Takes 500mg  at 5pm and 1000mg  daily      moxifloxacin 400 MG tablet   Commonly known as: AVELOX   Take 1 tablet (400 mg total) by mouth daily.      mulitivitamin with minerals Tabs   Take 1  tablet by mouth daily.      omeprazole 20 MG capsule   Commonly known as: PRILOSEC   Take 20 mg by mouth daily with breakfast.  oxyCODONE-acetaminophen 5-325 MG per tablet   Commonly known as: PERCOCET   Take 1-2 tablets by mouth every 6 (six) hours as needed.      predniSONE 50 MG tablet   Commonly known as: DELTASONE   Take 1 tablet (50 mg total) by mouth daily.      solifenacin 10 MG tablet   Commonly known as: VESICARE   Take 5 mg by mouth daily.      traZODone 100 MG tablet   Commonly known as: DESYREL   Take 100 mg by mouth at bedtime.      trihexyphenidyl 2 MG tablet   Commonly known as: ARTANE   Take 1 mg by mouth at bedtime.            Significant Diagnostic Studies:  Dg Chest 2 View 03/16/2012    IMPRESSION: Mild cardiac enlargement.  Early pulmonary vascular congestion is not excluded.  No focal consolidation.     Brief H and P: 61 year old female with history of asthma and COPD, DM, HTN who presented to ED with complaints of worsening shortness of breath started on the day of admission associated with cough productive of yellow sputum and subjective fever. Patient reported minimal relief with use of home nebulizers. No complaints of chest pain on admission, no palpitations. Hospital course complicated by worsened lung exam and requirement of higher doses of steroids by IV route. Patient did improve with physiotherapy and flutter valve.   Physical Exam on Discharge:   03/20/12 1245  BP: 176/86  Pulse: 82  Temp: 97.9 F (36.6 C)  TempSrc: Oral  Resp: 20  SpO2: 93%     Intake/Output Summary (Last 24 hours) at 03/20/12 1420   Gross per 24 hour  Intake    720 ml  Output   4800 ml  Net  -4080 ml    General: Alert, awake, oriented x3, in no acute distress. HEENT: No bruits, no goiter. Heart: Regular rate and rhythm, without murmurs, rubs, gallops. Lungs: Clear to auscultation bilaterally; no wheezing or rhonchi apprecaited Abdomen: Soft,  nontender, nondistended, positive bowel sounds. Extremities: No clubbing cyanosis or edema with positive pedal pulses. Neuro: Grossly intact, nonfocal.  CBC:    Component Value Date/Time   WBC 18.5* 03/20/2012 0350   HGB 11.7* 03/20/2012 0350   HCT 37.1 03/20/2012 0350   PLT 443* 03/20/2012 0350   MCV 82.6 03/20/2012 0350    Basic Metabolic Panel:    Component Value Date/Time   NA 136 03/20/2012 0350   K 4.2 03/20/2012 0350   CL 98 03/20/2012 0350   CO2 27 03/20/2012 0350   BUN 19 03/20/2012 0350   CREATININE 0.53 03/20/2012 0350   GLUCOSE 180* 03/20/2012 0350   CALCIUM 8.9 03/20/2012 0350    Time spent on Discharge: 30 minutes  Signed: Lore Polka 03/20/2012, 2:20 PM

## 2012-03-20 NOTE — Progress Notes (Signed)
CSW worked on pt D/C planning. [facility] requested  FL-2 information prior to accepting the pt. CSW is awaiting their reply.  Kayleen Memos. Leighton Ruff [phone]

## 2012-03-20 NOTE — Discharge Instructions (Signed)
Asthma, Acute Bronchospasm  Your exam shows you have asthma, or acute bronchospasm that acts like asthma. Bronchospasm means your air passages become narrowed. These conditions are due to inflammation and airway spasm that cause narrowing of the bronchial tubes in the lungs. This causes you to have wheezing and shortness of breath.  CAUSES    Respiratory infections and allergies most often bring on these attacks. Smoking, air pollution, cold air, emotional upsets, and vigorous exercise can also bring them on.    TREATMENT     Treatment is aimed at making the narrowed airways larger. Mild asthma/bronchospasm is usually controlled with inhaled medicines. Albuterol is a common medicine that you breathe in to open spastic or narrowed airways. Some trade names for albuterol are Ventolin or Proventil. Steroid medicine is also used to reduce the inflammation when an attack is moderate or severe. Antibiotics (medications used to kill germs) are only used if a bacterial infection is present.    If you are pregnant and need to use Albuterol (Ventolin or Proventil), you can expect the baby to move more than usual shortly after the medicine is used.   HOME CARE INSTRUCTIONS     Rest.    Drink plenty of liquids. This helps the mucus to remain thin and easily coughed up. Do not use caffeine or alcohol.    Do not smoke. Avoid being exposed to second-hand smoke.    You play a critical role in keeping yourself in good health. Avoid exposure to things that cause you to wheeze. Avoid exposure to things that cause you to have breathing problems. Keep your medications up-to-date and available. Carefully follow your doctor's treatment plan.    When pollen or pollution is bad, keep windows closed and use an air conditioner go to places with air conditioning. If you are allergic to furry pets or birds, find new homes for them or keep them outside.    Take your medicine exactly as prescribed.     Asthma requires careful medical attention. See your caregiver for follow-up as advised. If you are more than [redacted] weeks pregnant and you were prescribed any new medications, let your Obstetrician know about the visit and how you are doing. Arrange a recheck.   SEEK IMMEDIATE MEDICAL CARE IF:     You are getting worse.    You have trouble breathing. If severe, call 911.    You develop chest pain or discomfort.    You are throwing up or not drinking fluids.    You are not getting better within 24 hours.    You are coughing up yellow, green, brown, or bloody sputum.    You develop a fever over 102 F (38.9 C).    You have trouble swallowing.   MAKE SURE YOU:     Understand these instructions.    Will watch your condition.    Will get help right away if you are not doing well or get worse.   Document Released: 02/02/2007 Document Revised: 10/07/2011 Document Reviewed: 10/02/2007  ExitCare Patient Information 2012 [employer].    Asthma Attack Prevention  HOW CAN ASTHMA BE PREVENTED?  Currently, there is no way to prevent asthma from starting. However, you can take steps to control the disease and prevent its symptoms after you have been diagnosed. Learn about your asthma and how to control it. Take an active role to control your asthma by working with your caregiver to create and follow an asthma action plan. An asthma action plan   guides you in taking your medicines properly, avoiding factors that make your asthma worse, tracking your level of asthma control, responding to worsening asthma, and seeking emergency care when needed. To track your asthma, keep records of your symptoms, check your peak flow number using a peak flow meter (handheld device that shows how well air moves out of your lungs), and get regular asthma checkups.    Other ways to prevent asthma attacks include:   Use medicines as your caregiver directs.    Identify and avoid things that make your asthma worse (as much as you can).     Keep track of your asthma symptoms and level of control.    Get regular checkups for your asthma.    With your caregiver, write a detailed plan for taking medicines and managing an asthma attack. Then be sure to follow your action plan. Asthma is an ongoing condition that needs regular monitoring and treatment.    Identify and avoid asthma triggers. A number of outdoor allergens and irritants (pollen, mold, cold air, air pollution) can trigger asthma attacks. Find out what causes or makes your asthma worse, and take steps to avoid those triggers (see below).    Monitor your breathing. Learn to recognize warning signs of an attack, such as slight coughing, wheezing or shortness of breath. However, your lung function may already decrease before you notice any signs or symptoms, so regularly measure and record your peak airflow with a home peak flow meter.    Identify and treat attacks early. If you act quickly, you're less likely to have a severe attack. You will also need less medicine to control your symptoms. When your peak flow measurements decrease and alert you to an upcoming attack, take your medicine as instructed, and immediately stop any activity that may have triggered the attack. If your symptoms do not improve, get medical help.    Pay attention to increasing quick-relief inhaler use. If you find yourself relying on your quick-relief inhaler (such as albuterol), your asthma is not under control. See your caregiver about adjusting your treatment.   IDENTIFY AND CONTROL FACTORS THAT MAKE YOUR ASTHMA WORSE   A number of common things can set off or make your asthma symptoms worse (asthma triggers). Keep track of your asthma symptoms for several weeks, detailing all the environmental and emotional factors that are linked with your asthma. When you have an asthma attack, go back to your asthma diary to see which factor, or combination of factors, might have contributed to it. Once you know what these factors are, you can take steps to control many of them.    Allergies: If you have allergies and asthma, it is important to take asthma prevention steps at home. Asthma attacks (worsening of asthma symptoms) can be triggered by allergies, which can cause temporary increased inflammation of your airways. Minimizing contact with the substance to which you are allergic will help prevent an asthma attack.  Animal Dander:    Some people are allergic to the flakes of skin or dried saliva from animals with fur or feathers. Keep these pets out of your home.    If you can't keep a pet outdoors, keep the pet out of your bedroom and other sleeping areas at all times, and keep the door closed.    Remove carpets and furniture covered with cloth from your home. If that is not possible, keep the pet away from fabric-covered furniture and carpets.   Dust Mites:   Many   people with asthma are allergic to dust mites. Dust mites are tiny bugs that are found in every home, in mattresses, pillows, carpets, fabric-covered furniture, bedcovers, clothes, stuffed toys, fabric, and other fabric-covered items.    Cover your mattress in a special dust-proof cover.    Cover your pillow in a special dust-proof cover, or wash the pillow each week in hot water. Water must be hotter than 130 F to kill dust mites. Cold or warm water used with detergent and bleach can also be effective.    Wash the sheets and blankets on your bed each week in hot water.    Try not to sleep or lie on cloth-covered cushions.     Call ahead when traveling and ask for a smoke-free hotel room. Bring your own bedding and pillows, in case the hotel only supplies feather pillows and down comforters, which may contain dust mites and cause asthma symptoms.    Remove carpets from your bedroom and those laid on concrete, if you can.    Keep stuffed toys out of the bed, or wash the toys weekly in hot water or cooler water with detergent and bleach.   Cockroaches:   Many people with asthma are allergic to the droppings and remains of cockroaches.    Keep food and garbage in closed containers. Never leave food out.    Use poison baits, traps, powders, gels, or paste (for example, boric acid).    If a spray is used to kill cockroaches, stay out of the room until the odor goes away.   Indoor Mold:   Fix leaky faucets, pipes, or other sources of water that have mold around them.    Clean moldy surfaces with a cleaner that has bleach in it.   Pollen and Outdoor Mold:   When pollen or mold spore counts are high, try to keep your windows closed.    Stay indoors with windows closed from late morning to afternoon, if you can. Pollen and some mold spore counts are highest at that time.    Ask your caregiver whether you need to take or increase anti-inflammatory medicine before your allergy season starts.   Irritants:    Tobacco smoke is an irritant. If you smoke, ask your caregiver how you can quit. Ask family members to quit smoking, too. Do not allow smoking in your home or car.    If possible, do not use a wood-burning stove, kerosene heater, or fireplace. Minimize exposure to all sources of smoke, including incense, candles, fires, and fireworks.    Try to stay away from strong odors and sprays, such as perfume, talcum powder, hair spray, and paints.    Decrease humidity in your home and use an indoor air cleaning device. Reduce indoor humidity to below 60 percent. Dehumidifiers or central air conditioners can do this.     Try to have someone else vacuum for you once or twice a week, if you can. Stay out of rooms while they are being vacuumed and for a short while afterward.    If you vacuum, use a dust mask from a hardware store, a double-layered or microfilter vacuum cleaner bag, or a vacuum cleaner with a HEPA filter.    Sulfites in foods and beverages can be irritants. Do not drink beer or wine, or eat dried fruit, processed potatoes, or shrimp if they cause asthma symptoms.    Cold air can trigger an asthma attack. Cover your nose and mouth with a scarf on cold   or windy days.    Several health conditions can make asthma more difficult to manage, including runny nose, sinus infections, reflux disease, psychological stress, and sleep apnea. Your caregiver will treat these conditions, as well.    Avoid close contact with people who have a cold or the flu, since your asthma symptoms may get worse if you catch the infection from them. Wash your hands thoroughly after touching items that may have been handled by people with a respiratory infection.    Get a flu shot every year to protect against the flu virus, which often makes asthma worse for days or weeks. Also get a pneumonia shot once every five to 10 years.   Drugs:   Aspirin and other painkillers can cause asthma attacks. 10% to 20% of people with asthma have sensitivity to aspirin or a group of painkillers called non-steroidal anti-inflammatory drugs (NSAIDS), such as ibuprofen and naproxen. These drugs are used to treat pain and reduce fevers. Asthma attacks caused by any of these medicines can be severe and even fatal. These drugs must be avoided in people who have known aspirin sensitive asthma. Products with acetaminophen are considered safe for people who have asthma. It is important that people with aspirin sensitivity read labels of all over-the-counter drugs used to treat pain, colds, coughs, and fever.     Beta blockers and ACE inhibitors are other drugs which you should discuss with your caregiver, in relation to your asthma.   ALLERGY SKIN TESTING    Ask your asthma caregiver about allergy skin testing or blood testing (RAST test) to identify the allergens to which you are sensitive. If you are found to have allergies, allergy shots (immunotherapy) for asthma may help prevent future allergies and asthma. With allergy shots, small doses of allergens (substances to which you are allergic) are injected under your skin on a regular schedule. Over a period of time, your body may become used to the allergen and less responsive with asthma symptoms. You can also take measures to minimize your exposure to those allergens.  EXERCISE    If you have exercise-induced asthma, or are planning vigorous exercise, or exercise in cold, humid, or dry environments, prevent exercise-induced asthma by following your caregiver's advice regarding asthma treatment before exercising.  Document Released: 10/06/2009 Document Revised: 10/07/2011 Document Reviewed: 10/06/2009  [employer] Patient Information 2012 [employer].

## 2012-03-21 DIAGNOSIS — E1165 Type 2 diabetes mellitus with hyperglycemia: Secondary | ICD-10-CM

## 2012-03-21 DIAGNOSIS — E118 Type 2 diabetes mellitus with unspecified complications: Secondary | ICD-10-CM

## 2012-03-21 DIAGNOSIS — I10 Essential (primary) hypertension: Secondary | ICD-10-CM

## 2012-03-21 DIAGNOSIS — J45901 Unspecified asthma with (acute) exacerbation: Secondary | ICD-10-CM

## 2012-03-21 DIAGNOSIS — D7289 Other specified disorders of white blood cells: Secondary | ICD-10-CM

## 2012-03-21 LAB — GLUCOSE, CAPILLARY
Glucose-Capillary: 204 mg/dL — ABNORMAL HIGH (ref 70–99)
Glucose-Capillary: 135 mg/dL — ABNORMAL HIGH (ref 70–99)
Glucose-Capillary: 162 mg/dL — ABNORMAL HIGH (ref 70–99)

## 2012-03-21 NOTE — Progress Notes (Signed)
Pt is ready for discharge. [facility] has been consulted and medication updates have been made to the FL2. The pt has been notified and the facility accepted the pt returning today. Pt will be discharged pending medical necessity.  Kayleen Memos. Leighton Ruff [phone]

## 2012-03-21 NOTE — Progress Notes (Addendum)
Patient ID: Sherri Hays, female   DOB: 12/17/1950, 61 y.o.   MRN: [id number]  Patient ID: Sherri Hays MRN: [id number] DOB/AGE: 1951-07-04 61 y.o.  Admit date: 03/16/2012 Discharge date: 03/21/2012  Primary Care Physician:  Dorrene German, MD, MD   * no events overnight; I have examined the patient and her lung exam is again with no crackles or rhonchi or wheezing; no changes in treatment regimen since yesterday; patient may leave today to ALF  Assessment/Plan   Principal Problem:   * Shortness of breath  - secondary to acute asthma exacerbation  - patient respiratory status significantly better today  - we will continue nebulizer treatments PRN for shortness of breath and wheezing on discharge  - I have prescribed advair 250/50 Q 12 hours as patient needs this for better asthma control  - she can continue taking prednisone 50 mg daily for next 5 days  - received solumedrol 80 mg IV Q 8 hours while inpatient  - started azithromycin 500 mg IV daily for possible bronchitis however in light of CXR findings which may be slightly worse than on admission we initiated treatment for possible community acquired pneumonia with addition of ceftriaxone  - we will discharge patient with avelox for 7 days  - oxygen support via nasal canula to keep O2 saturation above 90%  - ABG results with respiratory acidosis although mild  - patient initially reported she uses CPAP at night but today she reported she actually did not so she does not need to continue CPAP in ALF  - will contact respiratory care for chest PT and flutter valve to be used in ALF to better mobilize secretions  - we will forward D/C summary to patient PCP to follow up on Aspergillus galactomanan results   Active Problems:   Acute exacerbation of asthma  - management as above with nebulizers and steroids   Community acquired pneumonia  - pneumonia order set placed  - antibiotics to include ceftriaxone and azithromycin  - CT chest  findings indicated below  - continue avelox on discharge   Diabetes Mellitus, uncontrolled with complications  - continue insulin 30 units sub Q at bedtime  - continue sliding scale insulin  - continue CBG monitoring: 135, 174, 204 - check A1c 6.3  - continue metformin per home regimen   Leukocytosis  - secondary to possible bronchitis and steroids  - continue azithromycin 500 mg daily IV and ceftriaxone IV   Diabetic neuropathy  - continue gabapentin   Hypertension, uncontrolled  - we ncreased lisinopril to 40 mg a day for better BP control  - discontinued metoprolol as it may worsen respiratory status even though it is a low dose but still may somewhat contribute to bronchoconstriction  - added hydralazine 50 mg Q 8 hours  - started Norvasc 10 mg Q day   Dyslipidemia  - continue statin   GERD  - continue protonix 40 mg daily   DVT Prophylaxis - lovenox sub Q   Code Status - full code   Education  - test results and diagnostic studies were discussed with patient  - patient verbalized the understanding  - questions were answered at the bedside and contact information was provided for additional questions or concerns   Consults to date:  1. Physical therapy - home health PT  2. Pulmonary - phone call.   Antibiotics:  Azithromycin 03/16/2012 --> 03/21/2012  Ceftriaxone 03/17/2012 --> 03/21/2012  Avelox on discharge for 7 days    Physical  Exam on Discharge:  Filed Vitals:   03/20/12 2059 03/20/12 2105 03/21/12 0528 03/21/12 0908  BP:  189/94 172/78 159/82  Pulse:  94 72   Temp:  98.3 F (36.8 C) 98 F (36.7 C)   TempSrc:  Oral Oral   Resp:  21 18   Height:      Weight:      SpO2: 98% 95% 97%      Intake/Output Summary (Last 24 hours) at 03/21/12 0943 Last data filed at 03/21/12 0844  Gross per 24 hour  Intake    960 ml  Output    900 ml  Net     60 ml    General: Alert, awake, oriented x3, in no acute distress. HEENT: No bruits, no goiter. Heart:  Regular rate and rhythm, without murmurs, rubs, gallops. Lungs: Clear to auscultation bilaterally. Abdomen: Soft, nontender, nondistended, positive bowel sounds. Extremities: No clubbing cyanosis or edema with positive pedal pulses. Neuro: Grossly intact, nonfocal.  CBC:    Component Value Date/Time   WBC 18.5* 03/20/2012 0350   HGB 11.7* 03/20/2012 0350   HCT 37.1 03/20/2012 0350   PLT 443* 03/20/2012 0350   MCV 82.6 03/20/2012 0350   NEUTROABS 16.0* 03/16/2012 1157   LYMPHSABS 0.8 03/16/2012 1157   MONOABS 0.1 03/16/2012 1157   EOSABS 0.0 03/16/2012 1157   BASOSABS 0.0 03/16/2012 1157    Basic Metabolic Panel:    Component Value Date/Time   NA 136 03/20/2012 0350   K 4.2 03/20/2012 0350   CL 98 03/20/2012 0350   CO2 27 03/20/2012 0350   BUN 19 03/20/2012 0350   CREATININE 0.53 03/20/2012 0350   GLUCOSE 180* 03/20/2012 0350   CALCIUM 8.9 03/20/2012 0350    Time spent on Discharge: 30 minutes  Signed: Francena Zender 03/21/2012, 9:43 AM

## 2012-03-21 NOTE — Progress Notes (Signed)
Clinical Social Work Department CLINICAL SOCIAL WORK PLACEMENT NOTE 03/21/2012  Patient:  Sherri Hays,Sherri Hays  Account Number:  [id number] Admit date:  03/16/2012  Clinical Social Worker:  Vinetta Brach, CLINICAL SOCIAL WORKER  Date/time:  03/21/2012 01:37 PM  Clinical Social Work is seeking post-discharge placement for this patient at the following level of care:   ASSISTED LIVING/REST HOME   (*CSW will update this form in Epic as items are completed)   03/20/2012  Patient/family provided with [facility] Department of Clinical Social Work's list of facilities offering this level of care within the geographic area requested by the patient (or if unable, by the patient's family).  03/20/2012  Patient/family informed of their freedom to choose among providers that offer the needed level of care, that participate in Medicare, Medicaid or managed care program needed by the patient, have an available bed and are willing to accept the patient.  03/20/2012  Patient/family informed of [facility]' ownership interest in [facility], as well as of the fact that they are under no obligation to receive care at this facility.  PASARR submitted to EDS on 03/20/2012 PASARR number received from [employer] on 03/20/2012  FL2 transmitted to all facilities in geographic area requested by pt/family on  03/20/2012 FL2 transmitted to all facilities within larger geographic area on 03/20/2012  Patient informed that his/her managed care company has contracts with or will negotiate with  certain facilities, including the following:     Patient/family informed of bed offers received:  03/20/2012 Patient chooses bed at [facility] Physician recommends and patient chooses bed at  [facility]  Patient to be transferred to  on  03/21/2012 Patient to be transferred to facility by   The following physician request were entered in Epic:   Additional Comments: In the end, husband declined anytype  of placement. However, Options are still on the table and facilities will be looking to continue to place the couple.  Tomeshia Pizzi C. Alizia Greif, LCSWA [phone]  

## 2012-03-21 NOTE — Progress Notes (Signed)
Patient d/c to AL, [facility], stable,denies pain.

## 2012-03-23 LAB — ASPERGILLUS ANTIBODY BY IMMUNODIFF: Aspergillus Antibody ID: NEGATIVE

## 2012-03-31 ENCOUNTER — Emergency Department (HOSPITAL_COMMUNITY)
Admission: EM | Admit: 2012-03-31 | Discharge: 2012-04-01 | Disposition: A | Discharge status: Home / Self Care | Payer: Medicaid Other | Attending: Emergency Medicine | Admitting: Emergency Medicine

## 2012-03-31 DIAGNOSIS — Z79899 Other long term (current) drug therapy: Secondary | ICD-10-CM | POA: Insufficient documentation

## 2012-03-31 DIAGNOSIS — W19XXXA Unspecified fall, initial encounter: Secondary | ICD-10-CM

## 2012-03-31 DIAGNOSIS — R1310 Dysphagia, unspecified: Secondary | ICD-10-CM | POA: Insufficient documentation

## 2012-03-31 DIAGNOSIS — E119 Type 2 diabetes mellitus without complications: Secondary | ICD-10-CM | POA: Insufficient documentation

## 2012-03-31 DIAGNOSIS — M25511 Pain in right shoulder: Secondary | ICD-10-CM

## 2012-03-31 DIAGNOSIS — IMO0002 Reserved for concepts with insufficient information to code with codable children: Secondary | ICD-10-CM | POA: Insufficient documentation

## 2012-03-31 DIAGNOSIS — F17200 Nicotine dependence, unspecified, uncomplicated: Secondary | ICD-10-CM | POA: Insufficient documentation

## 2012-03-31 DIAGNOSIS — J45909 Unspecified asthma, uncomplicated: Secondary | ICD-10-CM | POA: Insufficient documentation

## 2012-03-31 DIAGNOSIS — Z8673 Personal history of transient ischemic attack (TIA), and cerebral infarction without residual deficits: Secondary | ICD-10-CM | POA: Insufficient documentation

## 2012-03-31 DIAGNOSIS — I10 Essential (primary) hypertension: Secondary | ICD-10-CM | POA: Insufficient documentation

## 2012-03-31 DIAGNOSIS — Z853 Personal history of malignant neoplasm of breast: Secondary | ICD-10-CM | POA: Insufficient documentation

## 2012-03-31 DIAGNOSIS — M25519 Pain in unspecified shoulder: Secondary | ICD-10-CM | POA: Insufficient documentation

## 2012-03-31 DIAGNOSIS — A6000 Herpesviral infection of urogenital system, unspecified: Secondary | ICD-10-CM | POA: Insufficient documentation

## 2012-03-31 NOTE — ED Notes (Signed)
Patient from [facility].  Patient fell out of bed and now complaining of mid upper back pain and right upper arm and right side pain.

## 2012-04-01 ENCOUNTER — Encounter (HOSPITAL_COMMUNITY): Payer: Self-pay | Admitting: Emergency Medicine

## 2012-04-01 ENCOUNTER — Emergency Department (HOSPITAL_COMMUNITY): Payer: Medicaid Other

## 2012-04-01 ENCOUNTER — Other Ambulatory Visit (HOSPITAL_COMMUNITY): Payer: Self-pay | Admitting: *Deleted

## 2012-04-01 MED ORDER — IBUPROFEN 200 MG PO TABS
ORAL_TABLET | ORAL | Status: AC
  Administered 2012-04-01: 600 mg via ORAL
  Filled 2012-04-01: qty 3

## 2012-04-01 NOTE — ED Notes (Signed)
Patient from nursing home, was reaching for call bell and fell out of bed.  Patient complaining of right hip and right upper arm pain.  No deformity noted to either area.

## 2012-04-01 NOTE — ED Notes (Signed)
Patient transported to X-ray 

## 2012-04-01 NOTE — ED Provider Notes (Signed)
History     CSN: [id number]  Arrival date & time 03/31/12  2357   First MD Initiated Contact with Patient 04/01/12 0032      Chief Complaint  Patient presents with  . Fall     Patient is a 61 y.o. female presenting with fall. The history is provided by the patient.  Fall The accident occurred 1 to 2 hours ago. Incident: fell out of bed. Point of impact: back and right arm. Pain location: back and right shoulder. The pain is mild. Pertinent negatives include no fever and no loss of consciousness. The symptoms are aggravated by flexion and use of the injured limb. She has tried rest for the symptoms. The treatment provided mild relief.  pt reports she fell out of bed while at nursing facility Landed on right UE and hurt her back No head injury, no headache No chest pain, no SOB, no neck pain No focal weakness  Past Medical History  Diagnosis Date  . Hypertension   . Diabetes mellitus   . Dysphagia   . TIA (transient ischemic attack)   . Genital herpes   . DM neuropathies   . Schizophrenia, simple   . Anemia   . Arthritis   . Asthma   . Stroke   . Cancer     lt breast    Past Surgical History  Procedure Date  . Cholecystectomy   . Cesarean section   . Tonsillectomy     No family history on file.  History  Substance Use Topics  . Smoking status: Current Everyday Smoker -- 0.5 packs/day    Types: Cigarettes  . Smokeless tobacco: Never Used  . Alcohol Use: No    OB History    Grav Para Term Preterm Abortions TAB SAB Ect Mult Living                  Review of Systems  Constitutional: Negative for fever.  Cardiovascular: Negative for chest pain.  Neurological: Negative for loss of consciousness.    Allergies  Penicillins  Home Medications   Current Outpatient Rx  Name Route Sig Dispense Refill  . ACETAMINOPHEN 500 MG PO TABS Oral Take 1,000 mg by mouth every 6 (six) hours as needed. For pain    . ALBUTEROL SULFATE HFA 108 (90 BASE) MCG/ACT IN AERS  Inhalation Inhale 2 puffs into the lungs every 6 (six) hours as needed. For dyspnea 1 Inhaler 11  . AMLODIPINE BESYLATE 10 MG PO TABS Oral Take 1 tablet (10 mg total) by mouth daily. 30 tablet 3  . ATORVASTATIN CALCIUM 10 MG PO TABS Oral Take 10 mg by mouth at bedtime.    Marland Kitchen DICLOFENAC SODIUM 75 MG PO TBEC Oral Take 75 mg by mouth 2 (two) times daily.    Marland Kitchen FLUPHENAZINE HCL 10 MG PO TABS Oral Take 10 mg by mouth at bedtime.    Marland Kitchen FLUPHENAZINE DECANOATE 25 MG/ML IJ SOLN Intramuscular Inject 25 mg into the muscle every 14 (fourteen) days. Injection given at [facility]    . FLUTICASONE-SALMETEROL 250-50 MCG/DOSE IN AEPB Inhalation Inhale 1 puff into the lungs 2 (two) times daily. 60 each 11  . GABAPENTIN 300 MG PO CAPS Oral Take 300 mg by mouth 3 (three) times daily.    . GUAIFENESIN 100 MG/5ML PO LIQD Oral Take 10 mLs (200 mg total) by mouth every 4 (four) hours as needed for cough. For cough 120 mL 0  . HYDRALAZINE HCL 50 MG PO TABS Oral  Take 1 tablet (50 mg total) by mouth every 8 (eight) hours. 90 tablet 3  . HYDROXYZINE PAMOATE 25 MG PO CAPS Oral Take 25 mg by mouth every 4 (four) hours as needed. For itching    . LEVOFLOXACIN 500 MG PO TABS Oral Take 500 mg by mouth daily. For 7 days beginning 03/23/12    . LOPERAMIDE HCL 2 MG PO CAPS Oral Take 2 mg by mouth 4 (four) times daily as needed. For diarrhea    . METFORMIN HCL 500 MG PO TABS Oral Take 500 mg by mouth 2 (two) times daily with a meal.     . OXYCODONE-ACETAMINOPHEN 5-325 MG PO TABS Oral Take 1-2 tablets by mouth every 6 (six) hours as needed. pain    . PANTOPRAZOLE SODIUM 40 MG PO TBEC Oral Take 40 mg by mouth daily.    Marland Kitchen PREDNISONE 50 MG PO TABS Oral Take 1 tablet (50 mg total) by mouth daily. 5 tablet 0  . TRAZODONE HCL 100 MG PO TABS Oral Take 100 mg by mouth at bedtime.    . TRIHEXYPHENIDYL HCL 2 MG PO TABS Oral Take 1 mg by mouth at bedtime.       BP 145/76  Pulse 89  Temp(Src) 98.6 F (37 C) (Oral)  Resp 18  SpO2  98%  Physical Exam CONSTITUTIONAL: Well developed/well nourished HEAD AND FACE: Normocephalic/atraumatic EYES: EOMI/PERRL ENMT: Mucous membranes moist, No evidence of facial/nasal trauma NECK: supple no meningeal signs SPINE:cervical spine nontender, thoracic spine tender, no lumbar tenderness, No bruising/crepitance/stepoffs noted to spine CV: S1/S2 noted, no murmurs/rubs/gallops noted LUNGS: Lungs are clear to auscultation bilaterally, no apparent distress ABDOMEN: soft, nontender, no rebound or guarding GU:no cva tenderness NEURO: Pt is awake/alert, moves all extremitiesx4 EXTREMITIES: pulses normal, full ROM, mild tenderness to right shoulder/proximal humerus SKIN: warm, color normal PSYCH: no abnormalities of mood noted  ED Course  Procedures   Labs Reviewed - No data to display Dg Thoracic Spine 2 View  04/01/2012  *RADIOLOGY REPORT*  Clinical Data: Upper back pain following a fall tonight.  THORACIC SPINE - 2 VIEW  Comparison: Previous examinations.  Findings: Mild dextroconvex scoliosis.  Mild anterior spur formation at multiple levels.  No fractures or subluxations.  Right carotid artery calcification.  IMPRESSION:  1.  No fracture or subluxation. 2.  Mild scoliosis and degenerative changes. 3.  Right carotid artery atheromatous calcification.  Original Report Authenticated By: Darrol Angel, M.D.   Dg Shoulder Right  04/01/2012  *RADIOLOGY REPORT*  Clinical Data: Right shoulder pain following a fall tonight.  RIGHT SHOULDER - 2+ VIEW  Comparison: 09/13/2010.  Findings: Mild to moderate inferior glenohumeral spur formation and mild greater tuberosity hyperostosis.  No fracture or dislocation.  IMPRESSION:  1.  No fracture or dislocation. 2.  Degenerative changes.  Original Report Authenticated By: Darrol Angel, M.D.     1. Fall   2. Pain in right shoulder       MDM  Nursing notes reviewed and considered in documentation xrays reviewed and considered  No fracture  noted, pt otherwise well and at her baseline        Joya Gaskins, MD 04/01/12 [phone]

## 2012-04-02 ENCOUNTER — Encounter (HOSPITAL_COMMUNITY): Payer: Self-pay | Admitting: *Deleted

## 2012-04-02 ENCOUNTER — Emergency Department (HOSPITAL_COMMUNITY): Payer: Medicaid Other

## 2012-04-02 ENCOUNTER — Emergency Department (HOSPITAL_COMMUNITY)
Admission: EM | Admit: 2012-04-02 | Discharge: 2012-04-02 | Disposition: A | Discharge status: Home / Self Care | Payer: Medicaid Other | Attending: Emergency Medicine | Admitting: Emergency Medicine

## 2012-04-02 ENCOUNTER — Other Ambulatory Visit: Payer: Self-pay

## 2012-04-02 DIAGNOSIS — F17200 Nicotine dependence, unspecified, uncomplicated: Secondary | ICD-10-CM | POA: Insufficient documentation

## 2012-04-02 DIAGNOSIS — R5383 Other fatigue: Secondary | ICD-10-CM | POA: Insufficient documentation

## 2012-04-02 DIAGNOSIS — R531 Weakness: Secondary | ICD-10-CM

## 2012-04-02 DIAGNOSIS — I10 Essential (primary) hypertension: Secondary | ICD-10-CM | POA: Insufficient documentation

## 2012-04-02 DIAGNOSIS — M542 Cervicalgia: Secondary | ICD-10-CM | POA: Insufficient documentation

## 2012-04-02 DIAGNOSIS — Z8673 Personal history of transient ischemic attack (TIA), and cerebral infarction without residual deficits: Secondary | ICD-10-CM | POA: Insufficient documentation

## 2012-04-02 DIAGNOSIS — Z79899 Other long term (current) drug therapy: Secondary | ICD-10-CM | POA: Insufficient documentation

## 2012-04-02 DIAGNOSIS — R0602 Shortness of breath: Secondary | ICD-10-CM | POA: Insufficient documentation

## 2012-04-02 DIAGNOSIS — R079 Chest pain, unspecified: Secondary | ICD-10-CM | POA: Insufficient documentation

## 2012-04-02 DIAGNOSIS — R5381 Other malaise: Secondary | ICD-10-CM | POA: Insufficient documentation

## 2012-04-02 DIAGNOSIS — E119 Type 2 diabetes mellitus without complications: Secondary | ICD-10-CM | POA: Insufficient documentation

## 2012-04-02 DIAGNOSIS — R51 Headache: Secondary | ICD-10-CM | POA: Insufficient documentation

## 2012-04-02 LAB — URINALYSIS, ROUTINE W REFLEX MICROSCOPIC
Bilirubin Urine: NEGATIVE
Hgb urine dipstick: NEGATIVE
Specific Gravity, Urine: 1.02 (ref 1.005–1.030)
pH: 5.5 (ref 5.0–8.0)

## 2012-04-02 LAB — COMPREHENSIVE METABOLIC PANEL
ALT: 21 U/L (ref 0–35)
AST: 10 U/L (ref 0–37)
Albumin: 2.9 g/dL — ABNORMAL LOW (ref 3.5–5.2)
Calcium: 8.9 mg/dL (ref 8.4–10.5)
Creatinine, Ser: 0.67 mg/dL (ref 0.50–1.10)
Sodium: 138 mEq/L (ref 135–145)
Total Protein: 6.6 g/dL (ref 6.0–8.3)

## 2012-04-02 LAB — DIFFERENTIAL
Basophils Absolute: 0.1 10*3/uL (ref 0.0–0.1)
Basophils Relative: 0 % (ref 0–1)
Eosinophils Absolute: 0.3 10*3/uL (ref 0.0–0.7)
Eosinophils Relative: 2 % (ref 0–5)
Lymphocytes Relative: 20 % (ref 12–46)

## 2012-04-02 LAB — CBC
MCHC: 32.1 g/dL (ref 30.0–36.0)
MCV: 83.8 fL (ref 78.0–100.0)
Platelets: 319 10*3/uL (ref 150–400)
RDW: 17.8 % — ABNORMAL HIGH (ref 11.5–15.5)
WBC: 14.3 10*3/uL — ABNORMAL HIGH (ref 4.0–10.5)

## 2012-04-02 LAB — URINE MICROSCOPIC-ADD ON

## 2012-04-02 MED ORDER — SODIUM CHLORIDE 0.9 % IV SOLN: Freq: Once | INTRAVENOUS | Status: DC

## 2012-04-02 NOTE — ED Notes (Signed)
WUJ:[id number]<JY> Expected date:04/02/12<BR> Expected time: 3:19 PM<BR> Means of arrival:Ambulance<BR> Comments:<BR> M61. 61 f. unwitnessed fall at facility, possible near syncope. 15 mins

## 2012-04-02 NOTE — ED Notes (Signed)
Pt reports she felt dizzy and sob then collapsed; pt reports she has pain in her neck

## 2012-04-02 NOTE — ED Provider Notes (Signed)
History     CSN: [id number]  Arrival date & time 04/02/12  1601   First MD Initiated Contact with Patient 04/02/12 1610      Chief Complaint  Patient presents with  . Shortness of Breath    (Consider location/radiation/quality/duration/timing/severity/associated sxs/prior treatment) Patient is a 61 y.o. female presenting with shortness of breath. The history is provided by the patient.  Shortness of Breath  Associated symptoms include shortness of breath.   patient here complaining of syncopal event after going from sitting to standing. Note some shortness of breath. Denies any chest pain or cough recently. States that she was having dizzy and weak prior to all this. Denies any head injury after the event. No reported seizure activity. No abdominal pain or change in stool habits recently. Denies any numbness or tingling to arms or legs. Notes some left-sided neck pain. No blurred vision appreciated.  Past Medical History  Diagnosis Date  . Hypertension   . Diabetes mellitus   . Dysphagia   . TIA (transient ischemic attack)   . Genital herpes   . DM neuropathies   . Schizophrenia, simple   . Anemia   . Arthritis   . Asthma   . Stroke   . Cancer     lt breast    Past Surgical History  Procedure Date  . Cholecystectomy   . Cesarean section   . Tonsillectomy     No family history on file.  History  Substance Use Topics  . Smoking status: Current Everyday Smoker -- 0.5 packs/day    Types: Cigarettes  . Smokeless tobacco: Never Used  . Alcohol Use: No    OB History    Grav Para Term Preterm Abortions TAB SAB Ect Mult Living                  Review of Systems  Respiratory: Positive for shortness of breath.   All other systems reviewed and are negative.    Allergies  Penicillins  Home Medications   Current Outpatient Rx  Name Route Sig Dispense Refill  . ACETAMINOPHEN 500 MG PO TABS Oral Take 1,000 mg by mouth every 6 (six) hours as needed. For pain      . ALBUTEROL SULFATE HFA 108 (90 BASE) MCG/ACT IN AERS Inhalation Inhale 2 puffs into the lungs every 6 (six) hours as needed. For dyspnea 1 Inhaler 11  . AMLODIPINE BESYLATE 10 MG PO TABS Oral Take 1 tablet (10 mg total) by mouth daily. 30 tablet 3  . ATORVASTATIN CALCIUM 10 MG PO TABS Oral Take 10 mg by mouth at bedtime.    Marland Kitchen DICLOFENAC SODIUM 75 MG PO TBEC Oral Take 75 mg by mouth 2 (two) times daily.    Marland Kitchen FLUPHENAZINE HCL 10 MG PO TABS Oral Take 10 mg by mouth at bedtime.    Marland Kitchen FLUPHENAZINE DECANOATE 25 MG/ML IJ SOLN Intramuscular Inject 25 mg into the muscle every 14 (fourteen) days. Injection given at [facility]    . FLUTICASONE-SALMETEROL 250-50 MCG/DOSE IN AEPB Inhalation Inhale 1 puff into the lungs 2 (two) times daily. 60 each 11  . GABAPENTIN 300 MG PO CAPS Oral Take 300 mg by mouth 3 (three) times daily.    . GUAIFENESIN 100 MG/5ML PO LIQD Oral Take 10 mLs (200 mg total) by mouth every 4 (four) hours as needed for cough. For cough 120 mL 0  . HYDRALAZINE HCL 50 MG PO TABS Oral Take 1 tablet (50 mg total) by mouth every  8 (eight) hours. 90 tablet 3  . HYDROXYZINE PAMOATE 25 MG PO CAPS Oral Take 25 mg by mouth every 4 (four) hours as needed. For itching    . LEVOFLOXACIN 500 MG PO TABS Oral Take 500 mg by mouth daily. For 7 days beginning 03/23/12    . LOPERAMIDE HCL 2 MG PO CAPS Oral Take 2 mg by mouth 4 (four) times daily as needed. For diarrhea    . METFORMIN HCL 500 MG PO TABS Oral Take 500 mg by mouth 2 (two) times daily with a meal.     . OXYCODONE-ACETAMINOPHEN 5-325 MG PO TABS Oral Take 1-2 tablets by mouth every 6 (six) hours as needed. pain    . PANTOPRAZOLE SODIUM 40 MG PO TBEC Oral Take 40 mg by mouth daily.    Marland Kitchen PREDNISONE 50 MG PO TABS Oral Take 1 tablet (50 mg total) by mouth daily. 5 tablet 0  . TRAZODONE HCL 100 MG PO TABS Oral Take 100 mg by mouth at bedtime.    . TRIHEXYPHENIDYL HCL 2 MG PO TABS Oral Take 1 mg by mouth at bedtime.       BP 142/69  Pulse 87   Temp(Src) 98.9 F (37.2 C) (Oral)  Resp 20  SpO2 97%  Physical Exam  Nursing note and vitals reviewed. Constitutional: She is oriented to person, place, and time. She appears well-developed and well-nourished.  Non-toxic appearance. No distress.  HENT:  Head: Normocephalic and atraumatic.  Eyes: Conjunctivae, EOM and lids are normal. Pupils are equal, round, and reactive to light.  Neck: Normal range of motion. Neck supple. No tracheal deviation present. No mass present.  Cardiovascular: Normal rate, regular rhythm and normal heart sounds.  Exam reveals no gallop.   No murmur heard. Pulmonary/Chest: Effort normal and breath sounds normal. No stridor. No respiratory distress. She has no decreased breath sounds. She has no wheezes. She has no rhonchi. She has no rales.  Abdominal: Soft. Normal appearance and bowel sounds are normal. She exhibits no distension. There is no tenderness. There is no rebound and no CVA tenderness.  Musculoskeletal: Normal range of motion. She exhibits no edema and no tenderness.  Neurological: She is alert and oriented to person, place, and time. She has normal strength. No cranial nerve deficit or sensory deficit. GCS eye subscore is 4. GCS verbal subscore is 5. GCS motor subscore is 6.  Skin: Skin is warm and dry. No abrasion and no rash noted.  Psychiatric: Her speech is normal and behavior is normal. Her affect is blunt.    ED Course  Procedures (including critical care time)   Labs Reviewed  CBC  DIFFERENTIAL  COMPREHENSIVE METABOLIC PANEL  URINALYSIS, ROUTINE W REFLEX MICROSCOPIC  URINE CULTURE   Dg Thoracic Spine 2 View  04/01/2012  *RADIOLOGY REPORT*  Clinical Data: Upper back pain following a fall tonight.  THORACIC SPINE - 2 VIEW  Comparison: Previous examinations.  Findings: Mild dextroconvex scoliosis.  Mild anterior spur formation at multiple levels.  No fractures or subluxations.  Right carotid artery calcification.  IMPRESSION:  1.  No  fracture or subluxation. 2.  Mild scoliosis and degenerative changes. 3.  Right carotid artery atheromatous calcification.  Original Report Authenticated By: Darrol Angel, M.D.   Dg Shoulder Right  04/01/2012  *RADIOLOGY REPORT*  Clinical Data: Right shoulder pain following a fall tonight.  RIGHT SHOULDER - 2+ VIEW  Comparison: 09/13/2010.  Findings: Mild to moderate inferior glenohumeral spur formation and mild greater tuberosity hyperostosis.  No fracture or dislocation.  IMPRESSION:  1.  No fracture or dislocation. 2.  Degenerative changes.  Original Report Authenticated By: Darrol Angel, M.D.     No diagnosis found.    MDM   Date: 04/02/2012  Rate: 86  Rhythm: normal sinus rhythm  QRS Axis: normal  Intervals: normal  ST/T Wave abnormalities: nonspecific T wave changes  Conduction Disutrbances:none  Narrative Interpretation:   Old EKG Reviewed: unchanged  Patient able to ambulate and has no signs of dizziness. Patient is not tachycardic or short of breath at this time..Doubt that patient has a PE. She has no pleuritic chest pain or leg swelling. Suspect that dyspnea was from transient bronchospasm          Toy Baker, MD 04/02/12 1918

## 2012-04-02 NOTE — Discharge Instructions (Signed)
Return here at once for shortness of breath that is severe, chest discomfort, or if you pass out. Call your primary care Dr. tomorrow to schedule a followup appointment

## 2012-04-02 NOTE — ED Notes (Signed)
Patient given discharge instructions, information, prescriptions, and diet order. Patient states that they adequately understand discharge information given and to return to ED if symptoms return or worsen.     

## 2012-04-02 NOTE — ED Notes (Signed)
Pt attempted to provide [place] with a urine sample however, unable to void at this time.

## 2012-04-02 NOTE — ED Notes (Signed)
[facility] called for on emergent transport to home for patient due to weakness and shob.

## 2012-04-03 NOTE — Progress Notes (Signed)
[facility] to bring pt for bmet Has freq er visits-last 04/02/12-asthma-states she is fine today. Will bring dos and care for her post op

## 2012-04-04 LAB — URINE CULTURE: Culture  Setup Time: 201306030003

## 2012-04-05 ENCOUNTER — Encounter (HOSPITAL_BASED_OUTPATIENT_CLINIC_OR_DEPARTMENT_OTHER): Payer: Self-pay | Admitting: *Deleted

## 2012-04-06 ENCOUNTER — Encounter (HOSPITAL_BASED_OUTPATIENT_CLINIC_OR_DEPARTMENT_OTHER): Payer: Self-pay | Admitting: Anesthesiology

## 2012-04-06 ENCOUNTER — Ambulatory Visit
Admission: RE | Admit: 2012-04-06 | Discharge: 2012-04-06 | Disposition: A | Discharge status: Home / Self Care | Payer: Medicaid Other | Source: Ambulatory Visit | Attending: Surgery | Admitting: Surgery

## 2012-04-06 ENCOUNTER — Encounter (HOSPITAL_BASED_OUTPATIENT_CLINIC_OR_DEPARTMENT_OTHER)
Admission: RE | Disposition: A | Discharge status: Home / Self Care | Payer: Self-pay | Source: Ambulatory Visit | Attending: Surgery

## 2012-04-06 ENCOUNTER — Ambulatory Visit (HOSPITAL_COMMUNITY)
Admission: RE | Admit: 2012-04-06 | Discharge: 2012-04-06 | Disposition: A | Discharge status: Home / Self Care | Payer: Medicaid Other | Source: Ambulatory Visit | Attending: Surgery | Admitting: Surgery

## 2012-04-06 ENCOUNTER — Ambulatory Visit (HOSPITAL_BASED_OUTPATIENT_CLINIC_OR_DEPARTMENT_OTHER)
Admission: RE | Admit: 2012-04-06 | Discharge: 2012-04-06 | Disposition: A | Discharge status: Home / Self Care | Payer: Medicaid Other | Source: Ambulatory Visit | Attending: Surgery | Admitting: Surgery

## 2012-04-06 ENCOUNTER — Ambulatory Visit (HOSPITAL_BASED_OUTPATIENT_CLINIC_OR_DEPARTMENT_OTHER): Payer: Medicaid Other | Admitting: Anesthesiology

## 2012-04-06 DIAGNOSIS — D0590 Unspecified type of carcinoma in situ of unspecified breast: Secondary | ICD-10-CM

## 2012-04-06 DIAGNOSIS — D0510 Intraductal carcinoma in situ of unspecified breast: Secondary | ICD-10-CM

## 2012-04-06 DIAGNOSIS — E119 Type 2 diabetes mellitus without complications: Secondary | ICD-10-CM | POA: Insufficient documentation

## 2012-04-06 DIAGNOSIS — R197 Diarrhea, unspecified: Secondary | ICD-10-CM

## 2012-04-06 DIAGNOSIS — J45909 Unspecified asthma, uncomplicated: Secondary | ICD-10-CM

## 2012-04-06 DIAGNOSIS — R079 Chest pain, unspecified: Secondary | ICD-10-CM

## 2012-04-06 DIAGNOSIS — Z8673 Personal history of transient ischemic attack (TIA), and cerebral infarction without residual deficits: Secondary | ICD-10-CM | POA: Insufficient documentation

## 2012-04-06 DIAGNOSIS — I10 Essential (primary) hypertension: Secondary | ICD-10-CM | POA: Insufficient documentation

## 2012-04-06 DIAGNOSIS — J4489 Other specified chronic obstructive pulmonary disease: Secondary | ICD-10-CM | POA: Insufficient documentation

## 2012-04-06 DIAGNOSIS — J449 Chronic obstructive pulmonary disease, unspecified: Secondary | ICD-10-CM | POA: Insufficient documentation

## 2012-04-06 DIAGNOSIS — F209 Schizophrenia, unspecified: Secondary | ICD-10-CM

## 2012-04-06 SURGERY — BREAST LUMPECTOMY WITH SENTINEL LYMPH NODE BX
Anesthesia: General | Site: Breast | Laterality: Left | Wound class: Clean

## 2012-04-06 MED ORDER — TECHNETIUM TC 99M SULFUR COLLOID FILTERED
1.0000 | Freq: Once | INTRAVENOUS | Status: AC | PRN
  Administered 2012-04-06: 1 via INTRADERMAL

## 2012-04-06 MED ORDER — ONDANSETRON HCL 4 MG/2ML IJ SOLN
INTRAMUSCULAR | Status: DC | PRN
  Administered 2012-04-06: 4 mg via INTRAVENOUS

## 2012-04-06 MED ORDER — HYDROMORPHONE HCL PF 1 MG/ML IJ SOLN
0.2500 mg | INTRAMUSCULAR | Status: DC | PRN
  Administered 2012-04-06 (×2): 0.25 mg via INTRAVENOUS

## 2012-04-06 MED ORDER — METOCLOPRAMIDE HCL 5 MG/ML IJ SOLN
INTRAMUSCULAR | Status: DC | PRN
  Administered 2012-04-06: 10 mg via INTRAVENOUS

## 2012-04-06 MED ORDER — FENTANYL CITRATE 0.05 MG/ML IJ SOLN
50.0000 ug | INTRAMUSCULAR | Status: DC | PRN
  Administered 2012-04-06: 100 ug via INTRAVENOUS

## 2012-04-06 MED ORDER — DEXAMETHASONE SODIUM PHOSPHATE 4 MG/ML IJ SOLN
INTRAMUSCULAR | Status: DC | PRN
  Administered 2012-04-06: 5 mg via INTRAVENOUS

## 2012-04-06 MED ORDER — OXYCODONE HCL 5 MG PO TABS: 5.0000 mg | ORAL_TABLET | Freq: Once | ORAL | Status: DC | PRN

## 2012-04-06 MED ORDER — CEFAZOLIN SODIUM 1-5 GM-% IV SOLN
INTRAVENOUS | Status: DC | PRN
  Administered 2012-04-06: 2 g via INTRAVENOUS

## 2012-04-06 MED ORDER — LIDOCAINE HCL (CARDIAC) 20 MG/ML IV SOLN
INTRAVENOUS | Status: DC | PRN
  Administered 2012-04-06: 60 mg via INTRAVENOUS

## 2012-04-06 MED ORDER — ACETAMINOPHEN 10 MG/ML IV SOLN
1000.0000 mg | Freq: Once | INTRAVENOUS | Status: AC
  Administered 2012-04-06: 1000 mg via INTRAVENOUS

## 2012-04-06 MED ORDER — MIDAZOLAM HCL 2 MG/2ML IJ SOLN
0.5000 mg | INTRAMUSCULAR | Status: DC | PRN
  Administered 2012-04-06: 2 mg via INTRAVENOUS

## 2012-04-06 MED ORDER — OXYCODONE-ACETAMINOPHEN 10-325 MG PO TABS: 1.0000 | ORAL_TABLET | Freq: Four times a day (QID) | ORAL | Status: DC | PRN

## 2012-04-06 MED ORDER — BUPIVACAINE-EPINEPHRINE 0.25% -1:200000 IJ SOLN
INTRAMUSCULAR | Status: DC | PRN
  Administered 2012-04-06 (×2): 10 mL

## 2012-04-06 MED ORDER — PROPOFOL 10 MG/ML IV EMUL
INTRAVENOUS | Status: DC | PRN
  Administered 2012-04-06: 270 mg via INTRAVENOUS

## 2012-04-06 MED ORDER — METOCLOPRAMIDE HCL 5 MG/ML IJ SOLN: 10.0000 mg | Freq: Once | INTRAMUSCULAR | Status: DC | PRN

## 2012-04-06 MED ORDER — SODIUM CHLORIDE 0.9 % IJ SOLN
INTRAMUSCULAR | Status: DC | PRN
  Administered 2012-04-06: 3 mL

## 2012-04-06 MED ORDER — LACTATED RINGERS IV SOLN
INTRAVENOUS | Status: DC
  Administered 2012-04-06 (×2): via INTRAVENOUS

## 2012-04-06 MED ORDER — METHYLENE BLUE 1 % INJ SOLN
INTRAMUSCULAR | Status: DC | PRN
  Administered 2012-04-06: 2 mL via INTRADERMAL

## 2012-04-06 MED ORDER — FENTANYL CITRATE 0.05 MG/ML IJ SOLN
INTRAMUSCULAR | Status: DC | PRN
  Administered 2012-04-06 (×2): 25 ug via INTRAVENOUS

## 2012-04-06 SURGICAL SUPPLY — 56 items
APPLIER CLIP 9.375 MED OPEN (MISCELLANEOUS) ×2
BANDAGE ELASTIC 6 VELCRO ST LF (GAUZE/BANDAGES/DRESSINGS) IMPLANT
BINDER BREAST XLRG (GAUZE/BANDAGES/DRESSINGS) ×2 IMPLANT
BLADE SURG 10 STRL SS (BLADE) ×2 IMPLANT
BLADE SURG 15 STRL LF DISP TIS (BLADE) ×1 IMPLANT
BLADE SURG 15 STRL SS (BLADE) ×1
BLADE SURG ROTATE 9660 (MISCELLANEOUS) IMPLANT
CANISTER SUCTION 1200CC (MISCELLANEOUS) ×2 IMPLANT
CHLORAPREP W/TINT 26ML (MISCELLANEOUS) ×2 IMPLANT
CLIP APPLIE 9.375 MED OPEN (MISCELLANEOUS) ×1 IMPLANT
CLOTH BEACON ORANGE TIMEOUT ST (SAFETY) ×2 IMPLANT
COVER MAYO STAND STRL (DRAPES) ×2 IMPLANT
COVER PROBE W GEL 5X96 (DRAPES) ×2 IMPLANT
COVER TABLE BACK 60X90 (DRAPES) ×2 IMPLANT
DECANTER SPIKE VIAL GLASS SM (MISCELLANEOUS) IMPLANT
DERMABOND ADVANCED (GAUZE/BANDAGES/DRESSINGS) ×2
DERMABOND ADVANCED .7 DNX12 (GAUZE/BANDAGES/DRESSINGS) ×2 IMPLANT
DEVICE DUBIN W/COMP PLATE 8390 (MISCELLANEOUS) ×2 IMPLANT
DRAIN CHANNEL 19F RND (DRAIN) IMPLANT
DRAIN HEMOVAC 1/8 X 5 (WOUND CARE) IMPLANT
DRAPE LAPAROSCOPIC ABDOMINAL (DRAPES) ×2 IMPLANT
DRAPE UTILITY XL STRL (DRAPES) ×2 IMPLANT
ELECT COATED BLADE 2.86 ST (ELECTRODE) ×2 IMPLANT
ELECT REM PT RETURN 9FT ADLT (ELECTROSURGICAL) ×2
ELECTRODE REM PT RTRN 9FT ADLT (ELECTROSURGICAL) ×1 IMPLANT
EVACUATOR SILICONE 100CC (DRAIN) IMPLANT
GAUZE SPONGE 4X4 12PLY STRL LF (GAUZE/BANDAGES/DRESSINGS) IMPLANT
GLOVE BIO SURGEON STRL SZ7 (GLOVE) ×4 IMPLANT
GLOVE BIOGEL PI IND STRL 8 (GLOVE) ×1 IMPLANT
GLOVE BIOGEL PI INDICATOR 8 (GLOVE) ×1
GLOVE ECLIPSE 8.0 STRL XLNG CF (GLOVE) ×2 IMPLANT
GLOVE INDICATOR 7.0 STRL GRN (GLOVE) ×2 IMPLANT
GOWN PREVENTION PLUS XLARGE (GOWN DISPOSABLE) ×4 IMPLANT
HEMOSTAT SURGICEL 2X14 (HEMOSTASIS) ×2 IMPLANT
KIT MARKER MARGIN INK (KITS) ×2 IMPLANT
NDL SAFETY ECLIPSE 18X1.5 (NEEDLE) ×1 IMPLANT
NEEDLE HYPO 18GX1.5 SHARP (NEEDLE) ×1
NEEDLE HYPO 25X1 1.5 SAFETY (NEEDLE) ×4 IMPLANT
NS IRRIG 1000ML POUR BTL (IV SOLUTION) ×2 IMPLANT
PACK BASIN DAY SURGERY FS (CUSTOM PROCEDURE TRAY) ×2 IMPLANT
PENCIL BUTTON HOLSTER BLD 10FT (ELECTRODE) ×2 IMPLANT
PIN SAFETY STERILE (MISCELLANEOUS) IMPLANT
SLEEVE SCD COMPRESS KNEE MED (MISCELLANEOUS) ×2 IMPLANT
SPONGE LAP 18X18 X RAY DECT (DISPOSABLE) IMPLANT
SPONGE LAP 4X18 X RAY DECT (DISPOSABLE) ×2 IMPLANT
SUT ETHILON 3 0 PS 1 (SUTURE) IMPLANT
SUT MNCRL AB 3-0 PS2 18 (SUTURE) ×4 IMPLANT
SUT SILK 2 0 SH (SUTURE) IMPLANT
SUT VICRYL 3-0 CR8 SH (SUTURE) ×4 IMPLANT
SYR BULB 3OZ (MISCELLANEOUS) ×2 IMPLANT
SYR CONTROL 10ML LL (SYRINGE) ×4 IMPLANT
TOWEL OR 17X24 6PK STRL BLUE (TOWEL DISPOSABLE) ×2 IMPLANT
TOWEL OR NON WOVEN STRL DISP B (DISPOSABLE) ×2 IMPLANT
TUBE CONNECTING 20X1/4 (TUBING) ×2 IMPLANT
WATER STERILE IRR 1000ML POUR (IV SOLUTION) IMPLANT
YANKAUER SUCT BULB TIP NO VENT (SUCTIONS) ×2 IMPLANT

## 2012-04-06 NOTE — Anesthesia Procedure Notes (Signed)
Procedure Name: LMA Insertion Date/Time: 04/06/2012 1:56 PM Performed by: Burna Cash Pre-anesthesia Checklist: Patient identified, Emergency Drugs available, Suction available and Patient being monitored Patient Re-evaluated:Patient Re-evaluated prior to inductionOxygen Delivery Method: Circle System Utilized Preoxygenation: Pre-oxygenation with 100% oxygen Intubation Type: IV induction Ventilation: Mask ventilation without difficulty LMA: LMA inserted LMA Size: 4.0 Number of attempts: 1 Airway Equipment and Method: bite block Placement Confirmation: positive ETCO2 Tube secured with: Tape Dental Injury: Teeth and Oropharynx as per pre-operative assessment

## 2012-04-06 NOTE — Discharge Instructions (Addendum)
[facility] °Office Phone Number [phone] ° °BREAST BIOPSY/ PARTIAL MASTECTOMY: POST OP INSTRUCTIONS ° °Always review your discharge instruction sheet given to you by the facility where your surgery was performed. ° °IF YOU HAVE DISABILITY OR FAMILY LEAVE FORMS, YOU MUST BRING THEM TO THE OFFICE FOR PROCESSING.  DO NOT GIVE THEM TO YOUR DOCTOR. ° °1. A prescription for pain medication may be given to you upon discharge.  Take your pain medication as prescribed, if needed.  If narcotic pain medicine is not needed, then you may take acetaminophen (Tylenol) or ibuprofen (Advil) as needed. °2. Take your usually prescribed medications unless otherwise directed °3. If you need a refill on your pain medication, please contact your pharmacy.  They will contact our office to request authorization.  Prescriptions will not be filled after 5pm or on week-ends. °4. You should eat very light the first 24 hours after surgery, such as soup, crackers, pudding, etc.  Resume your normal diet the day after surgery. °5. Most patients will experience some swelling and bruising in the breast.  Ice packs and a good support bra will help.  Swelling and bruising can take several days to resolve.  °6. It is common to experience some constipation if taking pain medication after surgery.  Increasing fluid intake and taking a stool softener will usually help or prevent this problem from occurring.  A mild laxative (Milk of Magnesia or Miralax) should be taken according to package directions if there are no bowel movements after 48 hours. °7. Unless discharge instructions indicate otherwise, you may remove your bandages 24-48 hours after surgery, and you may shower at that time.  You may have steri-strips (small skin tapes) in place directly over the incision.  These strips should be left on the skin for 7-10 days.  If your surgeon used skin glue on the incision, you may shower in 24 hours.  The glue will flake off over the  next 2-3 weeks.  Any sutures or staples will be removed at the office during your follow-up visit. °8. ACTIVITIES:  You may resume regular daily activities (gradually increasing) beginning the next day.  Wearing a good support bra or sports bra minimizes pain and swelling.  You may have sexual intercourse when it is comfortable. °a. You may drive when you no longer are taking prescription pain medication, you can comfortably wear a seatbelt, and you can safely maneuver your car and apply brakes. °b. RETURN TO WORK:  ______________________________________________________________________________________ °9. You should see your doctor in the office for a follow-up appointment approximately two weeks after your surgery.  Your doctor’s nurse will typically make your follow-up appointment when she calls you with your pathology report.  Expect your pathology report 2-3 business days after your surgery.  You may call to check if you do not hear from us after three days. °10. OTHER INSTRUCTIONS: _______________________________________________________________________________________________ _____________________________________________________________________________________________________________________________________ °_____________________________________________________________________________________________________________________________________ °_____________________________________________________________________________________________________________________________________ ° °WHEN TO CALL YOUR DOCTOR: °1. Fever over 101.0 °2. Nausea and/or vomiting. °3. Extreme swelling or bruising. °4. Continued bleeding from incision. °5. Increased pain, redness, or drainage from the incision. ° °The clinic staff is available to answer your questions during regular business hours.  Please don’t hesitate to call and ask to speak to one of the nurses for clinical concerns.  If you have a medical emergency, go to the nearest  emergency room or call 911.  A surgeon from [facility] is always on call at the hospital. ° °For further questions, please visit [email or web address]  ° ° °  Post Anesthesia Home Care Instructions ° °Activity: °Get plenty of rest for the remainder of the day. A responsible adult should stay with you for 24 hours following the procedure.  °For the next 24 hours, DO NOT: °-Drive a car °-Operate machinery °-Drink alcoholic beverages °-Take any medication unless instructed by your physician °-Make any legal decisions or sign important papers. ° °Meals: °Start with liquid foods such as gelatin or soup. Progress to regular foods as tolerated. Avoid greasy, spicy, heavy foods. If nausea and/or vomiting occur, drink only clear liquids until the nausea and/or vomiting subsides. Call your physician if vomiting continues. ° °Special Instructions/Symptoms: °Your throat may feel dry or sore from the anesthesia or the breathing tube placed in your throat during surgery. If this causes discomfort, gargle with warm salt water. The discomfort should disappear within 24 hours. ° ° °Call your surgeon if you experience:  ° °1.  Fever over 101.0. °2.  Inability to urinate. °3.  Nausea and/or vomiting. °4.  Extreme swelling or bruising at the surgical site. °5.  Continued bleeding from the incision. °6.  Increased pain, redness or drainage from the incision. °7.  Problems related to your pain medication. ° ° °

## 2012-04-06 NOTE — H&P (View-Only) (Signed)
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  Will change to lumpectomy since patient wishes to conserve breast and do SLN due to location of the lesion. 

## 2012-04-06 NOTE — Transfer of Care (Signed)
Immediate Anesthesia Transfer of Care Note  Patient: Sherri Hays  Procedure(s) Performed: Procedure(s) (LRB): BREAST LUMPECTOMY WITH SENTINEL LYMPH NODE BX (Left)  Patient Location: PACU  Anesthesia Type: General  Level of Consciousness: sedated  Airway & Oxygen Therapy: Patient Spontanous Breathing and Patient connected to face mask oxygen  Post-op Assessment: Report given to PACU RN and Post -op Vital signs reviewed and stable  Post vital signs: Reviewed and stable  Complications: No apparent anesthesia complications

## 2012-04-06 NOTE — Op Note (Signed)
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  Pre-operative Diagnosis: left breast DCIS  Post-operative Diagnosis: left breast DCIS  Surgeon: Mizuki Hoel A.   Assistants: OR  Anesthesia: General LMA anesthesia and Local anesthesia 0.25.% bupivacaine, with epinephrine  ASA Class: 3  Procedure Details  The patient was seen in the Holding Room. The risks, benefits, complications, treatment options, and expected outcomes were discussed with the patient. The possibilities of reaction to medication, pulmonary aspiration, bleeding, infection, the need for additional procedures, failure to diagnose a condition, and creating a complication requiring transfusion or operation were discussed with the patient. The patient concurred with the proposed plan, giving informed consent.  The site of surgery properly noted/marked. The patient was taken to Operating Room # 4, identified as Sherri Hays and the procedure verified as Breast Lumpectomy and Sentinal Node Biopsy. A Time Out was held and the above information confirmed.  After induction of anesthesia, the left arm, breast, and chest were prepped and draped in standard fashion.   Methylene blue dye admixed with saline injected under nipple. Using a hand-held gamma probe, axillary sentinel nodes were identified transcutaneously.  An oblique incision was created below the axillary hairline.  Dissection was carried through the clavipectoral fascia.  2 level 1 axillary sentinel nodes were removed and submitted to pathology.  The axillary incision was closed with a 3-0 Vicryl AND 4 O  monocryl  subcuticular closure in layers.    The lumpectomy was performed by creating an oblique incision over the lower inner quadrant of the breastaround the previously placed  localization guidewire.  Dissection was carried down to the pectoral fascia.  The skin and pectoralis margins were inked.  Specimen radiography confirmed inclusion of the mammographic lesion.  Hemostasis was achieved with cautery.  The wound was irrigated and closed with a 4-0 Vicryl subcuticular closure in layers.    Sterile dressings were applied. At the end of the operation, all sponge, instrument, and needle counts were correct.  Findings: grossly clear surgical margins  Estimated Blood Loss:  less than 50 mL         Drains: none         Total IV Fluids: 600 ml         Specimens: 2 SLN   And left breast mass         Implants: none         Complications:  None; patient tolerated the procedure well.         Disposition: PACU - hemodynamically stable.         Condition: stable  Attending Attestation: I performed the procedure.

## 2012-04-06 NOTE — Interval H&P Note (Signed)
History and Physical Interval Note:  04/06/2012 1:29 PM  Sherri Hays  has presented today for surgery, with the diagnosis of Left breast cancer  The various methods of treatment have been discussed with the patient and family. After consideration of risks, benefits and other options for treatment, the patient has consented to  Procedure(s) (LRB): BREAST LUMPECTOMY WITH SENTINEL LYMPH NODE BX (Left) as a surgical intervention .  The patients' history has been reviewed, patient examined, no change in status, stable for surgery.  I have reviewed the patients' chart and labs.  Questions were answered to the patient's satisfaction.     Detavious Rinn A.

## 2012-04-06 NOTE — Anesthesia Postprocedure Evaluation (Signed)
Anesthesia Post Note  Patient: Sherri Hays  Procedure(s) Performed: Procedure(s) (LRB): BREAST LUMPECTOMY WITH SENTINEL LYMPH NODE BX (Left)  Anesthesia type: General  Patient location: PACU  Post pain: Pain level controlled  Post assessment: Patient's Cardiovascular Status Stable  Last Vitals:  Filed Vitals:   04/06/12 1625  BP: 164/58  Pulse: 97  Temp: 36.6 C  Resp: 18    Post vital signs: Reviewed and stable  Level of consciousness: alert  Complications: No apparent anesthesia complications

## 2012-04-06 NOTE — Anesthesia Preprocedure Evaluation (Signed)
Anesthesia Evaluation  Patient identified by MRN, date of birth, ID band Patient awake    Reviewed: Allergy & Precautions, H&P , NPO status , Patient's Chart, lab work & pertinent test results, reviewed documented beta blocker date and time   Airway Mallampati: II TM Distance: >3 FB Neck ROM: full    Dental   Pulmonary asthma , COPD         Cardiovascular hypertension, On Medications     Neuro/Psych PSYCHIATRIC DISORDERS TIA Neuromuscular disease CVA    GI/Hepatic negative GI ROS, Neg liver ROS,   Endo/Other  Diabetes mellitus-, Type 2, Oral Hypoglycemic AgentsMorbid obesity  Renal/GU negative Renal ROS  negative genitourinary   Musculoskeletal   Abdominal   Peds  Hematology negative hematology ROS (+)   Anesthesia Other Findings See surgeon's H&P   Reproductive/Obstetrics negative OB ROS                           Anesthesia Physical Anesthesia Plan  ASA: III  Anesthesia Plan: General   Post-op Pain Management:    Induction: Intravenous  Airway Management Planned: LMA  Additional Equipment:   Intra-op Plan:   Post-operative Plan: Extubation in OR  Informed Consent: I have reviewed the patients History and Physical, chart, labs and discussed the procedure including the risks, benefits and alternatives for the proposed anesthesia with the patient or authorized representative who has indicated his/her understanding and acceptance.   Dental Advisory Given  Plan Discussed with: CRNA and Surgeon  Anesthesia Plan Comments:         Anesthesia Quick Evaluation

## 2012-04-07 LAB — POCT I-STAT, CHEM 8
BUN: 7 mg/dL (ref 6–23)
Calcium, Ion: 1.18 mmol/L (ref 1.12–1.32)
Hemoglobin: 12.2 g/dL (ref 12.0–15.0)
Sodium: 143 mEq/L (ref 135–145)
TCO2: 29 mmol/L (ref 0–100)

## 2012-04-19 ENCOUNTER — Encounter (HOSPITAL_COMMUNITY): Payer: Self-pay | Admitting: Family Medicine

## 2012-04-19 ENCOUNTER — Emergency Department (HOSPITAL_COMMUNITY)
Admission: EM | Admit: 2012-04-19 | Discharge: 2012-04-20 | Disposition: A | Discharge status: Home / Self Care | Payer: Medicaid Other | Attending: Emergency Medicine | Admitting: Emergency Medicine

## 2012-04-19 DIAGNOSIS — E1149 Type 2 diabetes mellitus with other diabetic neurological complication: Secondary | ICD-10-CM | POA: Insufficient documentation

## 2012-04-19 DIAGNOSIS — I10 Essential (primary) hypertension: Secondary | ICD-10-CM | POA: Insufficient documentation

## 2012-04-19 DIAGNOSIS — F17200 Nicotine dependence, unspecified, uncomplicated: Secondary | ICD-10-CM | POA: Insufficient documentation

## 2012-04-19 DIAGNOSIS — R350 Frequency of micturition: Secondary | ICD-10-CM

## 2012-04-19 DIAGNOSIS — Z8673 Personal history of transient ischemic attack (TIA), and cerebral infarction without residual deficits: Secondary | ICD-10-CM | POA: Insufficient documentation

## 2012-04-19 DIAGNOSIS — F209 Schizophrenia, unspecified: Secondary | ICD-10-CM | POA: Insufficient documentation

## 2012-04-19 DIAGNOSIS — J45901 Unspecified asthma with (acute) exacerbation: Secondary | ICD-10-CM

## 2012-04-19 DIAGNOSIS — E1142 Type 2 diabetes mellitus with diabetic polyneuropathy: Secondary | ICD-10-CM | POA: Insufficient documentation

## 2012-04-19 DIAGNOSIS — R32 Unspecified urinary incontinence: Secondary | ICD-10-CM | POA: Insufficient documentation
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
## 2012-04-20 ENCOUNTER — Emergency Department (HOSPITAL_COMMUNITY): Payer: Medicaid Other

## 2012-04-20 LAB — URINALYSIS, ROUTINE W REFLEX MICROSCOPIC
Glucose, UA: NEGATIVE mg/dL
Hgb urine dipstick: NEGATIVE
Ketones, ur: NEGATIVE mg/dL
Leukocytes, UA: NEGATIVE
Protein, ur: NEGATIVE mg/dL
Urobilinogen, UA: 1 mg/dL (ref 0.0–1.0)

## 2012-04-20 LAB — DIFFERENTIAL
Eosinophils Absolute: 0.2 10*3/uL (ref 0.0–0.7)
Lymphocytes Relative: 31 % (ref 12–46)
Lymphs Abs: 4.3 10*3/uL — ABNORMAL HIGH (ref 0.7–4.0)
Monocytes Relative: 7 % (ref 3–12)
Neutro Abs: 8.6 10*3/uL — ABNORMAL HIGH (ref 1.7–7.7)
Neutrophils Relative %: 61 % (ref 43–77)

## 2012-04-20 LAB — POCT I-STAT, CHEM 8
Chloride: 104 mEq/L (ref 96–112)
HCT: 40 % (ref 36.0–46.0)
Hemoglobin: 13.6 g/dL (ref 12.0–15.0)
Potassium: 3.6 mEq/L (ref 3.5–5.1)
Sodium: 141 mEq/L (ref 135–145)

## 2012-04-20 LAB — GLUCOSE, CAPILLARY

## 2012-04-20 LAB — CBC
HCT: 38.3 % (ref 36.0–46.0)
MCHC: 32.4 g/dL (ref 30.0–36.0)
MCV: 82.4 fL (ref 78.0–100.0)
RDW: 17.6 % — ABNORMAL HIGH (ref 11.5–15.5)

## 2012-04-20 MED ORDER — ALBUTEROL SULFATE (5 MG/ML) 0.5% IN NEBU
5.0000 mg | INHALATION_SOLUTION | Freq: Once | RESPIRATORY_TRACT | Status: AC
  Administered 2012-04-20: 5 mg via RESPIRATORY_TRACT
  Filled 2012-04-20: qty 1

## 2012-04-20 MED ORDER — IPRATROPIUM BROMIDE 0.02 % IN SOLN
0.5000 mg | RESPIRATORY_TRACT | Status: AC
  Administered 2012-04-20: 0.5 mg via RESPIRATORY_TRACT
  Filled 2012-04-20: qty 2.5

## 2012-04-20 NOTE — ED Notes (Signed)
Pt discharged to lobby per orders; pt given phone to call taxi service. No s/s of distress noted at this time.

## 2012-04-20 NOTE — ED Notes (Signed)
Pt refusing IV placement at this time; MD aware. Pt also stating she was staying and not going to be discharged. RN informed patient that she has been discharged.

## 2012-04-20 NOTE — ED Provider Notes (Signed)
Medical screening examination/treatment/procedure(s) were performed by non-physician practitioner and as supervising physician I was immediately available for consultation/collaboration.   Celene Kras, MD 04/20/12 [phone]

## 2012-04-20 NOTE — Discharge Instructions (Signed)
Followup with your doctor in regards to your asthma exacerbation & urinary symptoms. Read the instructions below to learn more about factors that can trigger asthma. Use her albuterol inhaler as directed. Your more than welcome to return to the emergency department if you become short of breath, your albuterol inhaler did not relieve symptoms, you are having chest pain associated with difficulty breathing, or any symptoms that are concerning to you.    IDENTIFY AND CONTROL FACTORS THAT MAKE YOUR ASTHMA WORSE A number of common things can set off or make your asthma symptoms worse (asthma triggers). Keep track of your asthma symptoms for several weeks, detailing all the environmental and emotional factors that are linked with your asthma. When you have an asthma attack, go back to your asthma diary to see which factor, or combination of factors, might have contributed to it. Once you know what these factors are, you can take steps to control many of them.  Allergies: If you have allergies and asthma, it is important to take asthma prevention steps at home. Asthma attacks (worsening of asthma symptoms) can be triggered by allergies, which can cause temporary increased inflammation of your airways. Minimizing contact with the substance to which you are allergic will help prevent an asthma attack. Animal Dander:   Some people are allergic to the flakes of skin or dried saliva from animals with fur or feathers. Keep these pets out of your home.   If you can't keep a pet outdoors, keep the pet out of your bedroom and other sleeping areas at all times, and keep the door closed.   Remove carpets and furniture covered with cloth from your home. If that is not possible, keep the pet away from fabric-covered furniture and carpets.  Dust Mites:  Many people with asthma are allergic to dust mites. Dust mites are tiny bugs that are found in every home, in mattresses, pillows, carpets, fabric-covered furniture,  bedcovers, clothes, stuffed toys, fabric, and other fabric-covered items.   Cover your mattress in a special dust-proof cover.   Cover your pillow in a special dust-proof cover, or wash the pillow each week in hot water. Water must be hotter than 130 F to kill dust mites. Cold or warm water used with detergent and bleach can also be effective.   Wash the sheets and blankets on your bed each week in hot water.   Try not to sleep or lie on cloth-covered cushions.   Call ahead when traveling and ask for a smoke-free hotel room. Bring your own bedding and pillows, in case the hotel only supplies feather pillows and down comforters, which may contain dust mites and cause asthma symptoms.   Remove carpets from your bedroom and those laid on concrete, if you can.   Keep stuffed toys out of the bed, or wash the toys weekly in hot water or cooler water with detergent and bleach.  Cockroaches:  Many people with asthma are allergic to the droppings and remains of cockroaches.   Keep food and garbage in closed containers. Never leave food out.   Use poison baits, traps, powders, gels, or paste (for example, boric acid).   If a spray is used to kill cockroaches, stay out of the room until the odor goes away.  Indoor Mold:  Fix leaky faucets, pipes, or other sources of water that have mold around them.   Clean moldy surfaces with a cleaner that has bleach in it.  Pollen and Outdoor Mold:  When pollen or  mold spore counts are high, try to keep your windows closed.   Stay indoors with windows closed from late morning to afternoon, if you can. Pollen and some mold spore counts are highest at that time.   Ask your caregiver whether you need to take or increase anti-inflammatory medicine before your allergy season starts.  Irritants:   Tobacco smoke is an irritant. If you smoke, ask your caregiver how you can quit. Ask family members to quit smoking, too. Do not allow smoking in your home or car.    If possible, do not use a wood-burning stove, kerosene heater, or fireplace. Minimize exposure to all sources of smoke, including incense, candles, fires, and fireworks.   Try to stay away from strong odors and sprays, such as perfume, talcum powder, hair spray, and paints.   Decrease humidity in your home and use an indoor air cleaning device. Reduce indoor humidity to below 60 percent. Dehumidifiers or central air conditioners can do this.   Try to have someone else vacuum for you once or twice a week, if you can. Stay out of rooms while they are being vacuumed and for a short while afterward.   If you vacuum, use a dust mask from a hardware store, a double-layered or microfilter vacuum cleaner bag, or a vacuum cleaner with a HEPA filter.   Sulfites in foods and beverages can be irritants. Do not drink beer or wine, or eat dried fruit, processed potatoes, or shrimp if they cause asthma symptoms.   Cold air can trigger an asthma attack. Cover your nose and mouth with a scarf on cold or windy days.   Several health conditions can make asthma more difficult to manage, including runny nose, sinus infections, reflux disease, psychological stress, and sleep apnea. Your caregiver will treat these conditions, as well.   Avoid close contact with people who have a cold or the flu, since your asthma symptoms may get worse if you catch the infection from them. Wash your hands thoroughly after touching items that may have been handled by people with a respiratory infection.   Get a flu shot every year to protect against the flu virus, which often makes asthma worse for days or weeks. Also get a pneumonia shot once every five to 10 years.  Drugs:  Aspirin and other painkillers can cause asthma attacks. 10% to 20% of people with asthma have sensitivity to aspirin or a group of painkillers called non-steroidal anti-inflammatory drugs (NSAIDS), such as ibuprofen and naproxen. These drugs are used to treat  pain and reduce fevers. Asthma attacks caused by any of these medicines can be severe and even fatal. These drugs must be avoided in people who have known aspirin sensitive asthma. Products with acetaminophen are considered safe for people who have asthma. It is important that people with aspirin sensitivity read labels of all over-the-counter drugs used to treat pain, colds, coughs, and fever.   Beta blockers and ACE inhibitors are other drugs which you should discuss with your caregiver, in relation to your asthma.   EXERCISE  If you have exercise-induced asthma, or are planning vigorous exercise, or exercise in cold, humid, or dry environments, prevent exercise-induced asthma by following your caregiver's advice regarding asthma treatment before exercising. D

## 2012-04-20 NOTE — ED Notes (Signed)
Pt refused blood draw
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
## 2012-05-02 ENCOUNTER — Encounter (INDEPENDENT_AMBULATORY_CARE_PROVIDER_SITE_OTHER): Payer: Medicaid Other | Admitting: Surgery

## 2012-05-08 ENCOUNTER — Telehealth (INDEPENDENT_AMBULATORY_CARE_PROVIDER_SITE_OTHER): Payer: Self-pay | Admitting: General Surgery

## 2012-05-08 NOTE — Telephone Encounter (Signed)
[facility] and wanted to know if Dr Luisa Hart is going to refer Sherri Hays to them, I looked at his schedule and saw that the pt had no show back on July 2, Dawn wanted to know if Dr Luisa Hart was going to have pt come back in his office and then refer her over [facility]. If you could talk to Dr Luisa Hart and see what he say and call Dawn..[phone]

## 2012-05-09 ENCOUNTER — Encounter (INDEPENDENT_AMBULATORY_CARE_PROVIDER_SITE_OTHER): Payer: Self-pay | Admitting: Surgery

## 2012-05-09 NOTE — Telephone Encounter (Signed)
Sent staff message to dawn. Ref pt to med onc and I will reschedule pt to see [facility]

## 2012-05-11 ENCOUNTER — Encounter (HOSPITAL_COMMUNITY): Payer: Self-pay | Admitting: Emergency Medicine

## 2012-05-11 ENCOUNTER — Emergency Department (HOSPITAL_COMMUNITY): Payer: Medicaid Other

## 2012-05-11 ENCOUNTER — Emergency Department (HOSPITAL_COMMUNITY)
Admission: EM | Admit: 2012-05-11 | Discharge: 2012-05-12 | Disposition: A | Discharge status: Other Acute Inpatient Hospital | Payer: Medicaid Other | Attending: Emergency Medicine | Admitting: Emergency Medicine

## 2012-05-11 DIAGNOSIS — Z79899 Other long term (current) drug therapy: Secondary | ICD-10-CM | POA: Insufficient documentation

## 2012-05-11 DIAGNOSIS — R079 Chest pain, unspecified: Secondary | ICD-10-CM | POA: Insufficient documentation

## 2012-05-11 DIAGNOSIS — I10 Essential (primary) hypertension: Secondary | ICD-10-CM | POA: Insufficient documentation

## 2012-05-11 DIAGNOSIS — E119 Type 2 diabetes mellitus without complications: Secondary | ICD-10-CM | POA: Insufficient documentation

## 2012-05-11 DIAGNOSIS — M129 Arthropathy, unspecified: Secondary | ICD-10-CM | POA: Insufficient documentation

## 2012-05-11 DIAGNOSIS — F29 Unspecified psychosis not due to a substance or known physiological condition: Secondary | ICD-10-CM | POA: Insufficient documentation

## 2012-05-11 DIAGNOSIS — R45851 Suicidal ideations: Secondary | ICD-10-CM | POA: Insufficient documentation

## 2012-05-11 DIAGNOSIS — Z8659 Personal history of other mental and behavioral disorders: Secondary | ICD-10-CM | POA: Insufficient documentation

## 2012-05-11 DIAGNOSIS — J45909 Unspecified asthma, uncomplicated: Secondary | ICD-10-CM | POA: Insufficient documentation

## 2012-05-11 DIAGNOSIS — Z853 Personal history of malignant neoplasm of breast: Secondary | ICD-10-CM | POA: Insufficient documentation

## 2012-05-11 DIAGNOSIS — Z8673 Personal history of transient ischemic attack (TIA), and cerebral infarction without residual deficits: Secondary | ICD-10-CM | POA: Insufficient documentation

## 2012-05-11 LAB — COMPREHENSIVE METABOLIC PANEL
ALT: 16 U/L (ref 0–35)
AST: 17 U/L (ref 0–37)
Albumin: 3.4 g/dL — ABNORMAL LOW (ref 3.5–5.2)
Alkaline Phosphatase: 74 U/L (ref 39–117)
BUN: 8 mg/dL (ref 6–23)
CO2: 27 mEq/L (ref 19–32)
Calcium: 9.1 mg/dL (ref 8.4–10.5)
Chloride: 96 mEq/L (ref 96–112)
Creatinine, Ser: 0.59 mg/dL (ref 0.50–1.10)
GFR calc Af Amer: >90 mL/min (ref >90)
GFR calc non Af Amer: >90 mL/min (ref >90)
Glucose, Bld: 131 mg/dL — ABNORMAL HIGH (ref 70–99)
Potassium: 3.4 mEq/L — ABNORMAL LOW (ref 3.5–5.1)
Sodium: 134 mEq/L — ABNORMAL LOW (ref 135–145)
Total Bilirubin: 0.2 mg/dL — ABNORMAL LOW (ref 0.3–1.2)
Total Protein: 7.4 g/dL (ref 6.0–8.3)

## 2012-05-11 LAB — CBC
HCT: 36.7 % (ref 36.0–46.0)
Hemoglobin: 11.9 g/dL — ABNORMAL LOW (ref 12.0–15.0)
MCH: 26.6 pg (ref 26.0–34.0)
MCHC: 32.4 g/dL (ref 30.0–36.0)
MCV: 82.1 fL (ref 78.0–100.0)
Platelets: 378 10*3/uL (ref 150–400)
RBC: 4.47 MIL/uL (ref 3.87–5.11)
RDW: 17.6 % — ABNORMAL HIGH (ref 11.5–15.5)
WBC: 16.2 10*3/uL — ABNORMAL HIGH (ref 4.0–10.5)

## 2012-05-11 LAB — RAPID URINE DRUG SCREEN, HOSP PERFORMED
Amphetamines: NOT DETECTED
Barbiturates: NOT DETECTED
Benzodiazepines: NOT DETECTED
Cocaine: NOT DETECTED
Opiates: NOT DETECTED
Tetrahydrocannabinol: NOT DETECTED

## 2012-05-11 LAB — URINALYSIS, ROUTINE W REFLEX MICROSCOPIC
Bilirubin Urine: NEGATIVE
Glucose, UA: NEGATIVE mg/dL
Hgb urine dipstick: NEGATIVE
Ketones, ur: NEGATIVE mg/dL
Leukocytes, UA: NEGATIVE
Nitrite: NEGATIVE
Protein, ur: NEGATIVE mg/dL
Specific Gravity, Urine: 1.008 (ref 1.005–1.030)
Urobilinogen, UA: 0.2 mg/dL (ref 0.0–1.0)
pH: 6 (ref 5.0–8.0)

## 2012-05-11 LAB — ETHANOL: Alcohol, Ethyl (B): <11 mg/dL (ref 0–11)

## 2012-05-11 LAB — GLUCOSE, CAPILLARY: Glucose-Capillary: 92 mg/dL (ref 70–99)

## 2012-05-11 LAB — TROPONIN I: Troponin I: <0.3 ng/mL (ref <0.30)

## 2012-05-11 LAB — ACETAMINOPHEN LEVEL: Acetaminophen (Tylenol), Serum: <15 ug/mL (ref 10–30)

## 2012-05-11 MED ORDER — ALUM & MAG HYDROXIDE-SIMETH 200-200-20 MG/5ML PO SUSP: 30.0000 mL | ORAL | Status: DC | PRN

## 2012-05-11 MED ORDER — ACETAMINOPHEN 325 MG PO TABS: 650.0000 mg | ORAL_TABLET | ORAL | Status: DC | PRN

## 2012-05-11 MED ORDER — NICOTINE 21 MG/24HR TD PT24
21.0000 mg | MEDICATED_PATCH | Freq: Every day | TRANSDERMAL | Status: DC
  Administered 2012-05-11 – 2012-05-12 (×2): 21 mg via TRANSDERMAL
  Filled 2012-05-11 (×2): qty 1

## 2012-05-11 MED ORDER — ZOLPIDEM TARTRATE 5 MG PO TABS: 5.0000 mg | ORAL_TABLET | Freq: Every evening | ORAL | Status: DC | PRN

## 2012-05-11 MED ORDER — LORAZEPAM 1 MG PO TABS
1.0000 mg | ORAL_TABLET | Freq: Three times a day (TID) | ORAL | Status: DC | PRN
  Administered 2012-05-11: 1 mg via ORAL
  Filled 2012-05-11: qty 1

## 2012-05-11 MED ORDER — IBUPROFEN 200 MG PO TABS
600.0000 mg | ORAL_TABLET | Freq: Three times a day (TID) | ORAL | Status: DC | PRN
  Administered 2012-05-11: 600 mg via ORAL
  Filled 2012-05-11: qty 3

## 2012-05-11 MED ORDER — ONDANSETRON HCL 8 MG PO TABS: 4.0000 mg | ORAL_TABLET | Freq: Three times a day (TID) | ORAL | Status: DC | PRN
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
## 2012-05-11 NOTE — ED Notes (Signed)
Swallow screen done- pt would automatically fail d/t history of dysphagia, but pt did pass all other portions of swallow screen; Notified Dr Burt Knack order to place ST order-only for diet recommendations
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
## 2012-05-11 NOTE — ED Provider Notes (Signed)
History   This chart was scribed for Raeford Razor, MD by Charolett Bumpers . The patient was seen in room C25C/C25C.    CSN: [id number]  Arrival date & time 05/11/12  1540   None     Chief Complaint  Patient presents with  . Medical Clearance  . Suicidal    (Consider location/radiation/quality/duration/timing/severity/associated sxs/prior treatment) HPI Sherri Hays is a 62 y.o. female who presents to the Emergency Department for medical clearance. Pt is here with outpatient ACT concerned about health and safety of the patient and the pt's ability to take care of herself. Per outpatient ACT, patient was angry on Sunday (4 days ago) and took a weeks worth of medication. Pt states that she took the medications because she was angry with her boyfriend. Pt complains of side pain bilaterally and states that the "demons are hurting my ribs". Pt reports suicidal ideations. Patient reports auditory hallucinations, hearing "demons", sounding like family members. Pt reports visual hallucinations as well. Pt also reports that the "demons giver her a overactive bladder" She currently lives own her own for the past 3 weeks, and is visited by a community support member twice a week. Pt was previously living in a group home, [place]. [facility]. Outpatient ACT member denies any recent medication changes, but reports that the pt was on Prolixin injections, with the last treatment in March. Pt reports smoking. Patient denies any drug use. Patient has a h/o schizophrenia and multiple health problems.   Past Medical History  Diagnosis Date  . Hypertension   . Dysphagia   . TIA (transient ischemic attack)   . Genital herpes   . DM neuropathies   . Anemia   . Arthritis   . Asthma   . Cancer     lt breast  . Schizophrenia, simple   . Diabetes mellitus   . Stroke     Past Surgical History  Procedure Date  . Cholecystectomy   . Cesarean section   . Tonsillectomy     History reviewed. No  pertinent family history.  History  Substance Use Topics  . Smoking status: Current Everyday Smoker -- 0.5 packs/day    Types: Cigarettes  . Smokeless tobacco: Never Used  . Alcohol Use: No    OB History    Grav Para Term Preterm Abortions TAB SAB Ect Mult Living                  Review of Systems  Unable to perform ROS: Psychiatric disorder    Allergies  Penicillins  Home Medications   Current Outpatient Rx  Name Route Sig Dispense Refill  . ALBUTEROL SULFATE HFA 108 (90 BASE) MCG/ACT IN AERS Inhalation Inhale 2 puffs into the lungs every 6 (six) hours as needed. For dyspnea 1 Inhaler 11  . AMLODIPINE BESYLATE 10 MG PO TABS Oral Take 10 mg by mouth at bedtime.    . ATORVASTATIN CALCIUM 10 MG PO TABS Oral Take 10 mg by mouth at bedtime.    Marland Kitchen DICLOFENAC SODIUM 75 MG PO TBEC Oral Take 75 mg by mouth 2 (two) times daily.    Marland Kitchen FLUPHENAZINE HCL 10 MG PO TABS Oral Take 10 mg by mouth at bedtime.    Marland Kitchen FLUTICASONE-SALMETEROL 250-50 MCG/DOSE IN AEPB Inhalation Inhale 1 puff into the lungs 2 (two) times daily. 60 each 11  . GABAPENTIN 300 MG PO CAPS Oral Take 300 mg by mouth 3 (three) times daily.    Marland Kitchen HYDRALAZINE HCL 50  MG PO TABS Oral Take 50 mg by mouth 3 (three) times daily.    . INSULIN GLARGINE 100 UNIT/ML [place] SOLN Subcutaneous Inject 30 Units into the skin at bedtime.    Marland Kitchen METFORMIN HCL 500 MG PO TABS Oral Take 500 mg by mouth 2 (two) times daily with a meal.     . PANTOPRAZOLE SODIUM 40 MG PO TBEC Oral Take 40 mg by mouth daily.    . TRAZODONE HCL 100 MG PO TABS Oral Take 100 mg by mouth at bedtime.    . TRIHEXYPHENIDYL HCL 2 MG PO TABS Oral Take 1 mg by mouth at bedtime.       BP 146/65  Pulse 108  Temp 99.3 F (37.4 C) (Oral)  Resp 28  SpO2 96%  Physical Exam  Nursing note and vitals reviewed. Constitutional: She is oriented to person, place, and time. She appears well-developed and well-nourished. No distress.  HENT:  Head: Normocephalic and atraumatic.        TM's normal bilaterally.   Eyes: Conjunctivae and EOM are normal. Pupils are equal, round, and reactive to light.  Neck: Normal range of motion. Neck supple. No tracheal deviation present.  Cardiovascular: Normal rate, regular rhythm and normal heart sounds.   Pulmonary/Chest: Effort normal and breath sounds normal. No respiratory distress. She has no wheezes.  Abdominal: Soft. Bowel sounds are normal. She exhibits no distension. There is no tenderness.  Musculoskeletal: Normal range of motion.  Neurological: She is alert and oriented to person, place, and time.  Skin: Skin is warm and dry.  Psychiatric: She has a normal mood and affect. She is actively hallucinating.       Poor insight. Auditory and visual hallucinations.     ED Course  Procedures (including critical care time)  DIAGNOSTIC STUDIES: Oxygen Saturation is 96% on room air, adequate by my interpretation.    COORDINATION OF CARE:  1600: Discussed planned course of treatment with the patient, who is agreeable at this time.   1604: Medication Orders: Lorazepam (Ativan) tablet 1 mg-every 8 hrs PRN; Ibuprofen (Advil, Motrin) tablet 600 mg-every 8 hrs PRN; Acetaminophen (Tylenol) tablet 650 mg-every 4 hrs PRN; Zolpidem (Ambien) tablet 5 mg-at bedtime PRN; Alum & mag hydroxide-simeth (Mallox/Mylanta) 200-200-20 mg/5 mL suspension 30 mL-as needed; Ondansetron (Zofran) tablet 4 mg-every 8 hrs PRN  1615: Medication Orders: Nicotine (Nicoderm CG-doses in mg/24 hours) patch 21 mg-daily  Results for orders placed during the hospital encounter of 05/11/12  CBC      Component Value Range   WBC 16.2 (*) 4.0 - 10.5 K/uL   RBC 4.47  3.87 - 5.11 MIL/uL   Hemoglobin 11.9 (*) 12.0 - 15.0 g/dL   HCT 16.1  09.6 - 04.5 %   MCV 82.1  78.0 - 100.0 fL   MCH 26.6  26.0 - 34.0 pg   MCHC 32.4  30.0 - 36.0 g/dL   RDW 40.9 (*) 81.1 - 91.4 %   Platelets 378  150 - 400 K/uL  COMPREHENSIVE METABOLIC PANEL      Component Value Range   Sodium 134  (*) 135 - 145 mEq/L   Potassium 3.4 (*) 3.5 - 5.1 mEq/L   Chloride 96  96 - 112 mEq/L   CO2 27  19 - 32 mEq/L   Glucose, Bld 131 (*) 70 - 99 mg/dL   BUN 8  6 - 23 mg/dL   Creatinine, Ser 7.82  0.50 - 1.10 mg/dL   Calcium 9.1  8.4 - 95.6  mg/dL   Total Protein 7.4  6.0 - 8.3 g/dL   Albumin 3.4 (*) 3.5 - 5.2 g/dL   AST 17  0 - 37 U/L   ALT 16  0 - 35 U/L   Alkaline Phosphatase 74  39 - 117 U/L   Total Bilirubin 0.2 (*) 0.3 - 1.2 mg/dL   GFR calc non Af Amer >90  >90 mL/min   GFR calc Af Amer >90  >90 mL/min  ETHANOL      Component Value Range   Alcohol, Ethyl (B) <11  0 - 11 mg/dL  ACETAMINOPHEN LEVEL      Component Value Range   Acetaminophen (Tylenol), Serum <15.0  10 - 30 ug/mL  URINE RAPID DRUG SCREEN (HOSP PERFORMED)      Component Value Range   Opiates NONE DETECTED  NONE DETECTED   Cocaine NONE DETECTED  NONE DETECTED   Benzodiazepines NONE DETECTED  NONE DETECTED   Amphetamines NONE DETECTED  NONE DETECTED   Tetrahydrocannabinol NONE DETECTED  NONE DETECTED   Barbiturates NONE DETECTED  NONE DETECTED  TROPONIN I      Component Value Range   Troponin I <0.30  <0.30 ng/mL  URINALYSIS, ROUTINE W REFLEX MICROSCOPIC      Component Value Range   Color, Urine YELLOW  YELLOW   APPearance CLEAR  CLEAR   Specific Gravity, Urine 1.008  1.005 - 1.030   pH 6.0  5.0 - 8.0   Glucose, UA NEGATIVE  NEGATIVE mg/dL   Hgb urine dipstick NEGATIVE  NEGATIVE   Bilirubin Urine NEGATIVE  NEGATIVE   Ketones, ur NEGATIVE  NEGATIVE mg/dL   Protein, ur NEGATIVE  NEGATIVE mg/dL   Urobilinogen, UA 0.2  0.0 - 1.0 mg/dL   Nitrite NEGATIVE  NEGATIVE   Leukocytes, UA NEGATIVE  NEGATIVE  GLUCOSE, CAPILLARY      Component Value Range   Glucose-Capillary 92  70 - 99 mg/dL     Dg Chest 2 View  02/07/8118  *RADIOLOGY REPORT*  Clinical Data: Chest pain  CHEST - 2 VIEW  Comparison: 04/20/2012  Findings: Mild cardiomegaly.  Mildly tortuous aorta.  Clear lungs. No pleural effusion.  IMPRESSION: No  active cardiopulmonary disease.  Original Report Authenticated By: Donavan Burnet, M.D.     1. Psychosis       MDM  [age]-year-old female with hallucinations. Patient does have history schizophrenia, and appears like she has had an acute change in her symptoms ever since being sent from group home environment to more or less independent living. Caseworkers with patient very concerned about patient's ability to take care of herself and inappropriate self-medicating. We'll medically clear patient. Will discuss with tach. We'll obtain psych consult for recommendations as to whether patient needs acute hospitalization.  I personally preformed the services scribed in my presence. The recorded information has been reviewed and considered. Raeford Razor, MD.        Raeford Razor, MD 05/11/12 [phone]

## 2012-05-11 NOTE — ED Notes (Signed)
Pt moved from room 25 to room 22 so she could be closer to the restroom. Pt ambulated, steady gait noted.

## 2012-05-11 NOTE — ED Notes (Signed)
Pt placed in blue scrubs, contacted security to wand pt, notified AC and Charge RN for need for [employer] sitter

## 2012-05-11 NOTE — ED Notes (Signed)
Noted on pt's history that she has history of dysphagia; EDP placed order for regular diet; pt denies history of dysphagia, denies special diet- pt is not psychological stable to be able to accurately answer questions; pt coughing when trying to eat

## 2012-05-11 NOTE — BH Assessment (Signed)
Assessment Note   Sherri Hays is an 61 y.o. female.  Patient was brought in to [facility] by a member of her current ACTT team.  The ACTT provider is [facility].  [name] has not been taking her medications as prescribed.  She reports that last week she wanted to kill herself after she and live-in boyfriend got into an argument.  [name] reports that she thought about overdosing.  Documentation provided by [name] ([facility]) indicated that she did try to take all of her medications on 07/07 and threw them up.  At that time she did not call 911 or the ACTT crisis phone.  It was not reported until two days later.  At this time [name] denies HI, SI but she is clearly delusional.  [name] talks about demons telling her she is bad and should die.  The demons are also "sticking" her on her hands and back and feet.  This is probably neuropathy instead of demons however.  [name] moved out of an assisted living facility on June 19 and has not been able to manage her own care adequately and has been decompensating steadily.  She is not currently able to manage her diabetes safely on her own.  Her boyfriend is unable to provide much support in the area of [name]'s medical/psychiatric care.  [name] completed IVC papers since patient was stating she wanted to leave.  Telepsych was requested to provide assistance with medications.  The number provided by [name] with [facility] is [phone].  Axis I: 295.70 Schizoaffective D/O Axis II: Deferred Axis III:  Past Medical History  Diagnosis Date  . Hypertension   . Dysphagia   . TIA (transient ischemic attack)   . Genital herpes   . DM neuropathies   . Anemia   . Arthritis   . Asthma   . Cancer     lt breast  . Schizophrenia, simple   . Diabetes mellitus   . Stroke    Axis IV: occupational problems, other psychosocial or environmental problems, problems with access to health care services and problems with  primary support group Axis V: 21-30 behavior considerably influenced by delusions or hallucinations OR serious impairment in judgment, communication OR inability to function in almost all areas  Past Medical History:  Past Medical History  Diagnosis Date  . Hypertension   . Dysphagia   . TIA (transient ischemic attack)   . Genital herpes   . DM neuropathies   . Anemia   . Arthritis   . Asthma   . Cancer     lt breast  . Schizophrenia, simple   . Diabetes mellitus   . Stroke     Past Surgical History  Procedure Date  . Cholecystectomy   . Cesarean section   . Tonsillectomy     Family History: History reviewed. No pertinent family history.  Social History:  reports that she has been smoking Cigarettes.  She has been smoking about .5 packs per day. She has never used smokeless tobacco. She reports that she does not drink alcohol or use illicit drugs.  Additional Social History:  Alcohol / Drug Use Pain Medications: See medication reconcilliation Prescriptions: Patient unable to give good history of what her current medications are.  See med rec completed w/ assistance of current provider, [facility] Over the Counter: See med reconcilliation History of alcohol / drug use?: No history of alcohol / drug abuse (Patient reports no current drug or ETOH use)  CIWA: CIWA-Ar BP: 148/70 mmHg Pulse Rate: 88  COWS:    Allergies:  Allergies  Allergen Reactions  . Penicillins Other (See Comments)    unknown    Home Medications:  (Not in a hospital admission)  OB/GYN Status:  No LMP recorded. Patient is postmenopausal.  General Assessment Data Location of Assessment: [facility] ED Living Arrangements: Spouse/significant other (Lives with disabled boyfriend) Can pt return to current living arrangement?: Yes Admission Status: Involuntary (Pt currently psychotic, not exercising good judgement.) Is patient capable of signing voluntary admission?: No Transfer from: Acute  Hospital Referral Source: Other (ACTT provider [facility].)     Risk to self Suicidal Ideation: No Suicidal Intent: No Is patient at risk for suicide?: Yes (Impulsive.  Attempted OD on 07/07) Suicidal Plan?: No Access to Means: Yes Specify Access to Suicidal Means: Medications What has been your use of drugs/alcohol within the last 12 months?: None Previous Attempts/Gestures: Yes How many times?: 1  Other Self Harm Risks: Not taking meds as prescribed Triggers for Past Attempts: Unknown Intentional Self Injurious Behavior: None Family Suicide History: Unknown Recent stressful life event(s): Other (Comment) (Moved from a group home to independent living) Persecutory voices/beliefs?: Yes Depression: Yes Depression Symptoms: Isolating;Feeling worthless/self pity Substance abuse history and/or treatment for substance abuse?: No Suicide prevention information given to non-admitted patients: Not applicable  Risk to Others Homicidal Ideation: No Thoughts of Harm to Others: No Current Homicidal Intent: No Current Homicidal Plan: No Access to Homicidal Means: No Identified Victim: No one History of harm to others?: No Assessment of Violence: None Noted Violent Behavior Description: Patient irritable, loud voice, can be redirected however Does patient have access to weapons?: No Criminal Charges Pending?: No Does patient have a court date: No  Psychosis Hallucinations: Auditory;Tactile;With command (Demons telling her bad things and "stabbing" her arms & legs) Delusions: Persecutory  Mental Status Report Appear/Hygiene: Disheveled Eye Contact: Good Motor Activity: Agitation Speech: Loud;Tangential Level of Consciousness: Alert Mood: Anxious;Irritable Affect: Anxious;Frightened;Irritable;Apprehensive Anxiety Level: Severe Thought Processes: Irrelevant;Tangential Judgement: Impaired Orientation: Person;Place Obsessive Compulsive Thoughts/Behaviors:  None  Cognitive Functioning Concentration: Decreased Memory: Remote Intact;Recent Impaired IQ: Average Insight: Fair Impulse Control: Poor Appetite: Good Weight Loss: 0  Weight Gain: 0  Sleep: Decreased Total Hours of Sleep: 6  Vegetative Symptoms: None  ADLScreening [facility]) Patient's cognitive ability adequate to safely complete daily activities?: No (Due to mental health problems cannot reliably take meds) Patient able to express need for assistance with ADLs?: Yes Independently performs ADLs?: Yes  Abuse/Neglect [facility]) Physical Abuse: Denies Verbal Abuse: Denies Sexual Abuse: Denies  Prior Inpatient Therapy Prior Inpatient Therapy: Yes Prior Therapy Dates: Unknown Prior Therapy Facilty/Provider(s): Unknown Reason for Treatment: Schizoaffective D/O  Prior Outpatient Therapy Prior Outpatient Therapy: Yes Prior Therapy Dates: Over 5 years Prior Therapy Facilty/Provider(s): A community support provider then current ACTT provider ([facility]) Reason for Treatment: Patient's chronic mental health problems  ADL Screening (condition at time of admission) Patient's cognitive ability adequate to safely complete daily activities?: No (Due to mental health problems cannot reliably take meds) Patient able to express need for assistance with ADLs?: Yes Independently performs ADLs?: Yes Weakness of Legs: Both (Mild neuropathetic pain in feet) Weakness of Arms/Hands: None  Home Assistive Devices/Equipment Home Assistive Devices/Equipment: None    Abuse/Neglect Assessment (Assessment to be complete while patient is alone) Physical Abuse: Denies Verbal Abuse: Denies Sexual Abuse: Denies Exploitation of patient/patient's resources: Denies Self-Neglect: Yes, present (Comment) (Non-compliance with medications) Values / Beliefs Cultural Requests During  Hospitalization: None Spiritual Requests During Hospitalization: None   Advance Directives (For  Healthcare) Advance Directive: Patient does not have advance directive;Patient would not like information    Additional Information 1:1 In Past 12 Months?: No CIRT Risk: No Elopement Risk: No Does patient have medical clearance?: Yes     Disposition:  Disposition Disposition of Patient: Inpatient treatment program;Referred to Type of inpatient treatment program: Adult Patient referred to: Other (Comment) [facility] and other locations)  On Site Evaluation by:   Reviewed with Physician:  Dr. Brayton El Ray 05/11/2012 11:16 PM

## 2012-05-12 ENCOUNTER — Inpatient Hospital Stay (HOSPITAL_COMMUNITY)
Admission: AD | Admit: 2012-05-12 | Discharge: 2012-05-22 | DRG: 885 | Disposition: A | Discharge status: Home / Self Care | Payer: Medicaid Other | Source: Ambulatory Visit | Attending: Psychiatry | Admitting: Psychiatry

## 2012-05-12 DIAGNOSIS — M129 Arthropathy, unspecified: Secondary | ICD-10-CM | POA: Diagnosis present

## 2012-05-12 DIAGNOSIS — A6000 Herpesviral infection of urogenital system, unspecified: Secondary | ICD-10-CM | POA: Diagnosis present

## 2012-05-12 DIAGNOSIS — Z79899 Other long term (current) drug therapy: Secondary | ICD-10-CM

## 2012-05-12 DIAGNOSIS — Z794 Long term (current) use of insulin: Secondary | ICD-10-CM

## 2012-05-12 DIAGNOSIS — I10 Essential (primary) hypertension: Secondary | ICD-10-CM | POA: Diagnosis present

## 2012-05-12 DIAGNOSIS — F251 Schizoaffective disorder, depressive type: Secondary | ICD-10-CM

## 2012-05-12 DIAGNOSIS — E119 Type 2 diabetes mellitus without complications: Secondary | ICD-10-CM | POA: Diagnosis present

## 2012-05-12 DIAGNOSIS — Z8673 Personal history of transient ischemic attack (TIA), and cerebral infarction without residual deficits: Secondary | ICD-10-CM

## 2012-05-12 DIAGNOSIS — J4489 Other specified chronic obstructive pulmonary disease: Secondary | ICD-10-CM | POA: Diagnosis present

## 2012-05-12 DIAGNOSIS — E1142 Type 2 diabetes mellitus with diabetic polyneuropathy: Secondary | ICD-10-CM | POA: Diagnosis present

## 2012-05-12 DIAGNOSIS — J449 Chronic obstructive pulmonary disease, unspecified: Secondary | ICD-10-CM | POA: Diagnosis present

## 2012-05-12 DIAGNOSIS — F259 Schizoaffective disorder, unspecified: Principal | ICD-10-CM | POA: Diagnosis present

## 2012-05-12 DIAGNOSIS — J45909 Unspecified asthma, uncomplicated: Secondary | ICD-10-CM | POA: Diagnosis present

## 2012-05-12 DIAGNOSIS — E1149 Type 2 diabetes mellitus with other diabetic neurological complication: Secondary | ICD-10-CM | POA: Diagnosis present

## 2012-05-12 DIAGNOSIS — Z853 Personal history of malignant neoplasm of breast: Secondary | ICD-10-CM

## 2012-05-12 LAB — HEMOGLOBIN A1C
Hgb A1c MFr Bld: 7 % — ABNORMAL HIGH (ref <5.7)
Mean Plasma Glucose: 154 mg/dL — ABNORMAL HIGH (ref <117)

## 2012-05-12 MED ORDER — AMLODIPINE BESYLATE 10 MG PO TABS
10.0000 mg | ORAL_TABLET | Freq: Every day | ORAL | Status: DC
  Administered 2012-05-12 – 2012-05-21 (×10): 10 mg via ORAL
  Filled 2012-05-12: qty 1
  Filled 2012-05-12: qty 2
  Filled 2012-05-12 (×5): qty 1
  Filled 2012-05-12: qty 3
  Filled 2012-05-12 (×4): qty 1
  Filled 2012-05-12: qty 3
  Filled 2012-05-12: qty 1

## 2012-05-12 MED ORDER — ATORVASTATIN CALCIUM 10 MG PO TABS
10.0000 mg | ORAL_TABLET | Freq: Every day | ORAL | Status: DC
  Administered 2012-05-12 – 2012-05-21 (×10): 10 mg via ORAL
  Filled 2012-05-12 (×6): qty 1
  Filled 2012-05-12: qty 3
  Filled 2012-05-12 (×2): qty 1
  Filled 2012-05-12: qty 3
  Filled 2012-05-12 (×4): qty 1

## 2012-05-12 MED ORDER — FLUPHENAZINE HCL 2.5 MG PO TABS
2.5000 mg | ORAL_TABLET | Freq: Two times a day (BID) | ORAL | Status: DC
  Administered 2012-05-12: 2.5 mg via ORAL
  Filled 2012-05-12 (×3): qty 1

## 2012-05-12 MED ORDER — MAGNESIUM HYDROXIDE 400 MG/5ML PO SUSP: 30.0000 mL | Freq: Every day | ORAL | Status: DC | PRN

## 2012-05-12 MED ORDER — TRIHEXYPHENIDYL HCL 5 MG PO TABS
5.0000 mg | ORAL_TABLET | Freq: Every day | ORAL | Status: DC
  Administered 2012-05-12: 5 mg via ORAL
  Filled 2012-05-12 (×2): qty 1

## 2012-05-12 MED ORDER — ACETAMINOPHEN 325 MG PO TABS
650.0000 mg | ORAL_TABLET | Freq: Four times a day (QID) | ORAL | Status: DC | PRN
  Administered 2012-05-21: 650 mg via ORAL

## 2012-05-12 MED ORDER — TRAZODONE HCL 100 MG PO TABS
100.0000 mg | ORAL_TABLET | Freq: Every day | ORAL | Status: DC
  Administered 2012-05-12 – 2012-05-15 (×4): 100 mg via ORAL
  Filled 2012-05-12 (×8): qty 1

## 2012-05-12 MED ORDER — HYDRALAZINE HCL 50 MG PO TABS
50.0000 mg | ORAL_TABLET | Freq: Three times a day (TID) | ORAL | Status: DC
  Administered 2012-05-12 – 2012-05-15 (×9): 50 mg via ORAL
  Filled 2012-05-12 (×15): qty 1

## 2012-05-12 MED ORDER — FLUPHENAZINE HCL 10 MG PO TABS
10.0000 mg | ORAL_TABLET | Freq: Every day | ORAL | Status: DC
  Administered 2012-05-12 – 2012-05-17 (×6): 10 mg via ORAL
  Filled 2012-05-12 (×8): qty 1
  Filled 2012-05-12: qty 2

## 2012-05-12 MED ORDER — INSULIN GLARGINE 100 UNIT/ML ~~LOC~~ SOLN
30.0000 [IU] | Freq: Every day | SUBCUTANEOUS | Status: DC
  Administered 2012-05-12 – 2012-05-21 (×10): 30 [IU] via SUBCUTANEOUS

## 2012-05-12 MED ORDER — ALBUTEROL SULFATE HFA 108 (90 BASE) MCG/ACT IN AERS
2.0000 | INHALATION_SPRAY | Freq: Four times a day (QID) | RESPIRATORY_TRACT | Status: DC | PRN
  Administered 2012-05-13 – 2012-05-21 (×6): 2 via RESPIRATORY_TRACT
  Filled 2012-05-12: qty 6.7

## 2012-05-12 MED ORDER — GABAPENTIN 300 MG PO CAPS
300.0000 mg | ORAL_CAPSULE | Freq: Three times a day (TID) | ORAL | Status: DC
  Administered 2012-05-13 – 2012-05-15 (×8): 300 mg via ORAL
  Filled 2012-05-12 (×13): qty 1

## 2012-05-12 MED ORDER — ALUM & MAG HYDROXIDE-SIMETH 200-200-20 MG/5ML PO SUSP
30.0000 mL | ORAL | Status: DC | PRN
  Administered 2012-05-15: 30 mL via ORAL

## 2012-05-12 MED ORDER — FLUTICASONE-SALMETEROL 250-50 MCG/DOSE IN AEPB
1.0000 | INHALATION_SPRAY | Freq: Two times a day (BID) | RESPIRATORY_TRACT | Status: DC
  Administered 2012-05-13 – 2012-05-21 (×8): 1 via RESPIRATORY_TRACT
  Filled 2012-05-12 (×3): qty 14

## 2012-05-12 MED ORDER — HYDROXYZINE HCL 50 MG PO TABS
50.0000 mg | ORAL_TABLET | Freq: Every evening | ORAL | Status: DC | PRN
  Administered 2012-05-13 – 2012-05-16 (×4): 50 mg via ORAL
  Filled 2012-05-12 (×2): qty 1

## 2012-05-12 MED ORDER — TRIHEXYPHENIDYL HCL 2 MG PO TABS
1.0000 mg | ORAL_TABLET | Freq: Every day | ORAL | Status: DC
  Administered 2012-05-13 – 2012-05-21 (×9): 1 mg via ORAL
  Filled 2012-05-12 (×4): qty 1
  Filled 2012-05-12 (×2): qty 3
  Filled 2012-05-12 (×7): qty 1

## 2012-05-12 MED ORDER — PANTOPRAZOLE SODIUM 40 MG PO TBEC
40.0000 mg | DELAYED_RELEASE_TABLET | Freq: Every day | ORAL | Status: DC
  Administered 2012-05-13 – 2012-05-15 (×3): 40 mg via ORAL
  Filled 2012-05-12 (×4): qty 1

## 2012-05-12 MED ORDER — METFORMIN HCL 500 MG PO TABS
500.0000 mg | ORAL_TABLET | Freq: Two times a day (BID) | ORAL | Status: DC
  Administered 2012-05-13 – 2012-05-22 (×18): 500 mg via ORAL
  Filled 2012-05-12 (×21): qty 1
  Filled 2012-05-12: qty 6
  Filled 2012-05-12: qty 1
  Filled 2012-05-12: qty 6
  Filled 2012-05-12: qty 1

## 2012-05-12 MED ORDER — DICLOFENAC SODIUM 75 MG PO TBEC
75.0000 mg | DELAYED_RELEASE_TABLET | Freq: Two times a day (BID) | ORAL | Status: DC
  Administered 2012-05-12 – 2012-05-22 (×19): 75 mg via ORAL
  Filled 2012-05-12 (×7): qty 1
  Filled 2012-05-12: qty 6
  Filled 2012-05-12 (×3): qty 1
  Filled 2012-05-12: qty 6
  Filled 2012-05-12 (×8): qty 1
  Filled 2012-05-12: qty 6
  Filled 2012-05-12 (×2): qty 1
  Filled 2012-05-12: qty 6
  Filled 2012-05-12 (×3): qty 1

## 2012-05-12 NOTE — ED Notes (Signed)
First meeting with patient. Patient sitting on side of bed eating breakfast with NAD at this time. Family member sitting with patient talking with patient. Sitter at bedside.

## 2012-05-12 NOTE — ED Provider Notes (Signed)
Telepsych saw the pt and recommended prolixin and artene and admission.  Gwyneth Sprout, MD 05/12/12 [phone]

## 2012-05-12 NOTE — BH Assessment (Signed)
[facility] Assessment Progress Note      1530:  Pt has been accepted to [facility] to Dr. Allena Katz.  All paperwork completed and faxed to [facility].  Faxed [place] paperwork to [employer].  Dr. And nursing staff notified and agreeable with disposition.  Tamera Punt, LPC

## 2012-05-12 NOTE — ED Provider Notes (Signed)
  Physical Exam  BP 151/67  Pulse 87  Temp 98.4 F (36.9 C) (Oral)  Resp 20  SpO2 96%  Physical Exam  ED Course  Procedures  MDM Patient has been accepted at [facility] Dr Sandie Ano R. Rubin Payor, MD 05/12/12 1521

## 2012-05-12 NOTE — Progress Notes (Signed)
Patient ID: Sherri Hays, female   DOB: June 14, 1951, 61 y.o.   MRN: [id number] Patient is a 61 year old female admitted IVC. She is a poor historian during the admission process. Denied having any physical or psychiatric problems. Reported to Clinical research associate that she was here to "Get my medications in a pill box." Patient did not understand events leading up to her admission. Patient is followed by an ACT team. The patient made several delusional comments stating "My grandmother put a root on me. Demons have been bothering me ever since. They attack my feet and hands." Patient was pleasant during the admission process. Denied SI/HI. She reports hearing the voices of "my dead grandmother morning, noon and night." Oriented to the 400 hall unit and routine. Ate dinner after arriving to the unit and wanted to make a phone call.

## 2012-05-12 NOTE — Tx Team (Signed)
Initial Interdisciplinary Treatment Plan  PATIENT STRENGTHS: (choose at least two) Ability for insight Active sense of humor Supportive family/friends  PATIENT STRESSORS: Health problems Medication change or noncompliance   PROBLEM LIST: Problem List/Patient Goals Date to be addressed Date deferred Reason deferred Estimated date of resolution        Psychosis     05/13/12                                                      DISCHARGE CRITERIA:  Ability to meet basic life and health needs Adequate post-discharge living arrangements Improved stabilization in mood, thinking, and/or behavior Medical problems require only outpatient monitoring Motivation to continue treatment in a less acute level of care Need for constant or close observation no longer present Reduction of life-threatening or endangering symptoms to within safe limits Safe-care adequate arrangements made Verbal commitment to aftercare and medication compliance  PRELIMINARY DISCHARGE PLAN: Placement in alternative living arrangements  PATIENT/FAMIILY INVOLVEMENT: This treatment plan has been presented to and reviewed with the patient, Sherri Hays, and/or family member.  The patient and family have been given the opportunity to ask questions and make suggestions.  Sherri Hays 05/12/2012, 7:30 PM

## 2012-05-12 NOTE — ED Notes (Signed)
[facility] called to transport patient to [facility].

## 2012-05-13 LAB — GLUCOSE, CAPILLARY
Glucose-Capillary: 92 mg/dL (ref 70–99)
Glucose-Capillary: 167 mg/dL — ABNORMAL HIGH (ref 70–99)
Glucose-Capillary: 153 mg/dL — ABNORMAL HIGH (ref 70–99)
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
## 2012-05-13 NOTE — Progress Notes (Signed)
Psychoeducational Group Note  Date:  05/13/2012 Time: 2000  Group Topic/Focus:  Wrap-Up Group:   The focus of this group is to help patients review their daily goal of treatment and discuss progress on daily workbooks.  Participation Level:  Minimal  Participation Quality:  Appropriate  Affect:  Appropriate  Cognitive:  Confused  Insight:  Limited  Engagement in Group:  Limited  Additional Comments:  Pt didn't share much in group. Pt stated that she just got here from [facility] long ED. Pt shared that she hears voices. Nikol Lemar A 05/13/2012, 1:50 AM

## 2012-05-13 NOTE — H&P (Signed)
Psychiatric Admission Assessment Adult  Patient Identification:  Sherri Hays Date of Evaluation:  05/13/2012 61yo SAAF CC: "demons are hurting my ribs" +AH History of Present Illness: ACT team worker brought to ED as they were concerned that she was not able to take care of herself. Has been in independent living for 3 weeks -prior to that was in  Group home [place]. [facility]. Earlier this week she took a weeks worth of her meds after arguing with her boyfriend.Now feels the "demons are hurting her ribs and are giving her an overactive bladder." [facility] feel she is not able to manage her own care adequately and has been steadily decompensating and requires inhospital stabilization.      Past Psychiatric History: Says she was diagnosed at age 45 with schizophrenia.  Moved out of [place] on June 19th and overdosed 7/7  Did not report and did not require care.   Substance Abuse History:  Social History:    reports that she has been smoking Cigarettes.  She has been smoking about .5 packs per day. She has never used smokeless tobacco. She reports that she does not drink alcohol or use illicit drugs.  Family Psych History: None  Past Medical History:     Past Medical History  Diagnosis Date  . Hypertension   . Dysphagia   . TIA (transient ischemic attack)   . Genital herpes   . DM neuropathies   . Anemia   . Arthritis   . Asthma   . Cancer     lt breast  . Schizophrenia, simple   . Diabetes mellitus   . Stroke        Past Surgical History  Procedure Date  . Cholecystectomy   . Cesarean section   . Tonsillectomy     Allergies:  Allergies  Allergen Reactions  . Penicillins Other (See Comments)    unknown    Current Medications:  Prior to Admission medications   Medication Sig Start Date End Date Taking? Authorizing Provider  albuterol (PROVENTIL HFA;VENTOLIN HFA) 108 (90 BASE) MCG/ACT inhaler Inhale 2 puffs into the lungs every 6 (six) hours as  needed. For dyspnea 03/20/12   Alison Murray, MD  amLODipine (NORVASC) 10 MG tablet Take 10 mg by mouth at bedtime. 03/20/12 03/20/13  Alison Murray, MD  atorvastatin (LIPITOR) 10 MG tablet Take 10 mg by mouth at bedtime.    Historical Provider, MD  diclofenac (VOLTAREN) 75 MG EC tablet Take 75 mg by mouth 2 (two) times daily.    Historical Provider, MD  fluPHENAZine (PROLIXIN) 10 MG tablet Take 10 mg by mouth at bedtime.    Historical Provider, MD  Fluticasone-Salmeterol (ADVAIR DISKUS) 250-50 MCG/DOSE AEPB Inhale 1 puff into the lungs 2 (two) times daily. 03/20/12   Alison Murray, MD  gabapentin (NEURONTIN) 300 MG capsule Take 300 mg by mouth 3 (three) times daily.    Historical Provider, MD  hydrALAZINE (APRESOLINE) 50 MG tablet Take 50 mg by mouth 3 (three) times daily. 03/20/12 03/20/13  Alison Murray, MD  insulin glargine (LANTUS) 100 UNIT/ML injection Inject 30 Units into the skin at bedtime.    Historical Provider, MD  metFORMIN (GLUCOPHAGE) 500 MG tablet Take 500 mg by mouth 2 (two) times daily with a meal.     Historical Provider, MD  pantoprazole (PROTONIX) 40 MG tablet Take 40 mg by mouth daily.    Historical Provider, MD  traZODone (DESYREL) 100 MG tablet Take 100 mg  by mouth at bedtime.    Historical Provider, MD  trihexyphenidyl (ARTANE) 2 MG tablet Take 1 mg by mouth at bedtime.     Historical Provider, MD    Mental Status Examination/Evaluation: Objective:  Appearance: Casual in hospital gowns   Psychomotor Activity:  Normal  Eye Contact::  Good  Speech:  Clear and Coherent and Normal Rate  Volume:  Normal  Mood: appropriate to situation    Affect:  Full Range  Thought Process: somewhat clear rational gol oriented-go back to AL   Orientation:  Full  Thought Content:  Hallucinations: Auditory Command:   Tactile  Suicidal Thoughts:  No  Homicidal Thoughts:  No  Judgement:  Impaired  Insight:  Shallow    DIAGNOSIS:    AXIS I Schizoaffective DO versus Schizophrenia   AXIS  II Deferred  AXIS III See medical history.  AXIS IV housing problems, other psychosocial or environmental problems and problems related to social environment  AXIS V 21-30 behavior considerably influenced by delusions or hallucinations OR serious impairment in judgment, communication OR inability to function in almost all areas     Treatment Plan Summary: Admit for safety and stabilization Adjust meds as indicated  Work with [facility] [phone] for discharge plans  Agree with H&P from ED

## 2012-05-13 NOTE — Progress Notes (Addendum)
Psychoeducational Group Note  Date:  05/13/2012 Time:  0945 am  Group Topic/Focus:  Identifying Needs:   The focus of this group is to help patients identify their personal needs that have been historically problematic and identify healthy behaviors to address their needs.  Participation Level:  Did Not Attend    Andrena Mews 05/13/2012, 10:26 AM

## 2012-05-13 NOTE — [facility] Suicide Risk Assessment (Signed)
Suicide Risk Assessment  Admission Assessment     Demographic factors:  Assessment Details Time of Assessment: Admission Information Obtained From: Patient Current Mental Status:    Loss Factors:  Loss Factors: Decline in physical health;Financial problems / change in socioeconomic status Historical Factors:    Risk Reduction Factors:  Risk Reduction Factors: Sense of responsibility to family;Religious beliefs about death;Living with another person, especially a relative;Positive social support;Positive therapeutic relationship  CLINICAL FACTORS:   Severe Anxiety and/or Agitation Schizophrenia:   Paranoid or undifferentiated type Currently Psychotic Unstable or Poor Therapeutic Relationship  COGNITIVE FEATURES THAT CONTRIBUTE TO RISK:  Closed-mindedness Loss of executive function Polarized thinking    SUICIDE RISK:   Mild:  Suicidal ideation of limited frequency, intensity, duration, and specificity.  There are no identifiable plans, no associated intent, mild dysphoria and related symptoms, good self-control (both objective and subjective assessment), few other risk factors, and identifiable protective factors, including available and accessible social support.  PLAN OF CARE: Patient was admitted to 400 hall for crisis stabilization, safety and secure therapeutic milieu.    Gabryelle Whitmoyer,JANARDHAHA R. 05/13/2012, 12:18 PM

## 2012-05-13 NOTE — Progress Notes (Signed)
Patient ID: Sherri Hays, female   DOB: Jan 15, 1951, 61 y.o.   MRN: [id number]   [facility] Group Notes:  (Counselor/Nursing/MHT/Case Management/Adjunct)  05/13/2012 11 AM  Type of Therapy:  Aftercare Planning, Group Therapy, Dance/Movement Therapy   Participation Level:  Did Not Attend   Cassidi Long 05/13/2012. 11:24 AM

## 2012-05-13 NOTE — Progress Notes (Addendum)
[facility] Adult Inpatient Family/Significant Other Suicide Prevention Education  Suicide Prevention Education:  Contact Attempts:  Matt Holmes, boyfriend, has been identified by the patient as the family member/significant other with whom the patient will be residing, and identified as the person(s) who will aid the patient in the event of a mental health crisis.  With written consent from the patient, therapist left word for Mr. Excell Seltzer to call.  No response at this time.  Another attempt will be made at a later time  prior to and/or following the patient's discharge.  We were unsuccessful in providing suicide prevention education.  A suicide education pamphlet was given to the patient to share with family/significant other.  Date and time of first attempt: 05/13/12, 4:30 pm Date and time of second attempt: 05/14/12 3:30 PM   05/13/2012, 4:28 PM

## 2012-05-13 NOTE — Progress Notes (Signed)
D   Pt is cooperative and pleasant on approach  She did not attend group and has limited insight  She is diabetic and was asked to tell what she had for lunch  She said mashed potatoes, chocolate cake, fried chicken and peas   Pt was then asked if she thought the lunch would run her blood sugar up to which she replied no    She talks real loud and almost sounds threatening however I believe that is just the way she communicates   She has not been aggressive toward anyone and mostly keeps to herself when out on the milue  She does report auditory hallucinations and passive suicidal ideation and contracts for safety  A   Verbal support given   Medications administered and effectiveness monitored  Reinforce a lower carbohydrate diet and offer alternate choices  Q 15 min checks  Redirect behaviors as needed R   Pt safe at present  Verbalized she did not think her diet would run her blood sugar up so she is not receptive at present

## 2012-05-14 LAB — GLUCOSE, CAPILLARY: Glucose-Capillary: 114 mg/dL — ABNORMAL HIGH (ref 70–99)

## 2012-05-14 NOTE — Progress Notes (Signed)
Sherri Hays  61 y.o.  [id number] 1951/07/17  05/14/2012   Diagnosis:  schizophrenia  Subjective: Sherri Hays is up and in the day room prior to lunch.  She is pleasant and cooperative.  Her voice is somewhat loud. She notes that she is sleeping ok, and that her appetite is moderate.  She states her depression is significant at 8/10.  She denies SI/HI, she states she is "stil hearing voices" but she does go to [facility] for her medications.  She denies any anxiety, but she does endorse feelings of hopelessness.  She says "isn't that what depression is?"  Vital Signs:Blood pressure 123/82, pulse 85, temperature 98.1 F (36.7 C), temperature source Oral, resp. rate 20, height 5\' 2"  (1.575 m), weight 90.719 kg (200 lb).   Objective: Pleasant loud obese AAF whose speech is clear and goal directed.  Her affect is pleasant and positive.  Her mood is depressed but she does not appear to be so.  Medications Scheduled:  . amLODipine  10 mg Oral QHS  . atorvastatin  10 mg Oral QHS  . diclofenac  75 mg Oral BID  . fluPHENAZine  10 mg Oral QHS  . Fluticasone-Salmeterol  1 puff Inhalation BID  . gabapentin  300 mg Oral TID  . hydrALAZINE  50 mg Oral TID  . insulin glargine  30 Units Subcutaneous QHS  . metFORMIN  500 mg Oral BID WC  . pantoprazole  40 mg Oral Q1200  . traZODone  100 mg Oral QHS  . trihexyphenidyl  1 mg Oral QHS     PRN Meds acetaminophen, albuterol, alum & mag hydroxide-simeth, hydrOXYzine, magnesium hydroxide  Plan: 1. Continue current plan of care with no changes at this time.  2. Consider SSRI or SNRI such as Cymbalta for depression after meds are confirmed with [facility] on Monday. 3. Monitor closely the CBGs to see if meds need to be adjusted over the coming days.  Her HgbA1c was slightly elevated from previous check.  This could be from non-compliance and will return to her normal upon regular checks and dosing of meds. 4. Continue to monitor. Rona Ravens. Lakeria Starkman [facility] 05/14/2012  2:26 PM

## 2012-05-14 NOTE — Progress Notes (Signed)
Met with pt 1:1 in her room. She reports having a good day. While attempting to obtain BP pt indicated she needed to urinate and proceeded to the bathroom. When she returned she commented, "the demons want me to wet the bed. I'm trying not to." Pt states she hears these demons but denies any VH. Support given. BP still elevated but improved from this time last evening. Continues to refuse advair but requested and was given ventolin inhaler with relief. Denies any SI//HI and is currently in group. Lawrence Marseilles

## 2012-05-14 NOTE — Progress Notes (Signed)
Psychoeducational Group Note  Date:  05/14/2012 Time:  1515  Group Topic/Focus:  Conflict Resolution:   The focus of this group is to discuss the conflict resolution process and how it may be used upon discharge.  Participation Level:  Active  Participation Quality:  Appropriate  Affect:  Appropriate  Cognitive:  Oriented  Insight:  Good  Engagement in Group:  Good  Additional Comments:  none  Graceann Congress Celcia 05/14/2012, 5:43 PM

## 2012-05-14 NOTE — Progress Notes (Signed)
Psychoeducational Group Note  Date:  05/14/2012 Time:  0945 am  Group Topic/Focus:  Making Healthy Choices:   The focus of this group is to help patients identify negative/unhealthy choices they were using prior to admission and identify positive/healthier coping strategies to replace them upon discharge.  Participation Level:  Did not attend    Sherri Hays 05/14/2012, 10:29 AM

## 2012-05-14 NOTE — Progress Notes (Signed)
D   Pt has isolated to her room most of the day   She did not go to groups and did not come down for medications this morning   She said she wasn't feeling good but could not be more specific   She did go to the cafeteria for meals and used the tub room off the unit for a bath   She is pleasant on approach and cooperative  A   Verbal support given  Medications administered and effectiveness monitored   Encourage presence on the milue  R   Pt safe at present time

## 2012-05-14 NOTE — Progress Notes (Signed)
Patient ID: Sherri Hays, female   DOB: Jul 21, 1951, 61 y.o.   MRN: [id number]   [facility] Group Notes:  (Counselor/Nursing/MHT/Case Management/Adjunct)  05/14/2012 11 AM  Type of Therapy:  Aftercare Planning, Group Therapy, Dance/Movement Therapy   Participation Level:  Did Not Attend   Cassidi Long 05/14/2012. 11:43 AM

## 2012-05-14 NOTE — Progress Notes (Signed)
Met with pt 1:1 at start of shift in the dayroom where she was watching TV. States day was "okay" but does not expand. Pt did attend group and other than advair which she refuses (and has no plans of taking going forward), she is med compliant. Support given. BP obtained and remains elevated though improved after 2000 antihypertensive dose. Pt's demeanor is somewhat gruff at times and mood appears irritable though pt is cooperative and is not a behavioral problem on the unit. Due to lack of privacy earlier in dayroom, assessment was completed at hs in pt's room. At that time she denied any hallucinations, SI/HI however is in and out of sleep and is ready for this writer to leave room. No evidence of confusion or delusions at present. Lawrence Marseilles

## 2012-05-15 DIAGNOSIS — F251 Schizoaffective disorder, depressive type: Secondary | ICD-10-CM | POA: Diagnosis present

## 2012-05-15 DIAGNOSIS — F259 Schizoaffective disorder, unspecified: Principal | ICD-10-CM

## 2012-05-15 LAB — GLUCOSE, CAPILLARY
Glucose-Capillary: 275 mg/dL — ABNORMAL HIGH (ref 70–99)
Glucose-Capillary: 128 mg/dL — ABNORMAL HIGH (ref 70–99)

## 2012-05-15 MED ORDER — NICOTINE POLACRILEX 2 MG MT GUM: 2.0000 mg | CHEWING_GUM | OROMUCOSAL | Status: DC | PRN

## 2012-05-15 MED ORDER — FLUPHENAZINE DECANOATE 25 MG/ML IJ SOLN
25.0000 mg | INTRAMUSCULAR | Status: DC
  Administered 2012-05-15: 25 mg via INTRAMUSCULAR
  Filled 2012-05-15: qty 1

## 2012-05-15 MED ORDER — HYDRALAZINE HCL 50 MG PO TABS
50.0000 mg | ORAL_TABLET | ORAL | Status: DC
  Administered 2012-05-15 – 2012-05-22 (×20): 50 mg via ORAL
  Filled 2012-05-15: qty 1
  Filled 2012-05-15: qty 9
  Filled 2012-05-15 (×8): qty 1
  Filled 2012-05-15: qty 9
  Filled 2012-05-15: qty 1
  Filled 2012-05-15: qty 9
  Filled 2012-05-15 (×8): qty 1
  Filled 2012-05-15: qty 9
  Filled 2012-05-15: qty 1
  Filled 2012-05-15: qty 9
  Filled 2012-05-15: qty 1
  Filled 2012-05-15: qty 9
  Filled 2012-05-15 (×3): qty 1

## 2012-05-15 MED ORDER — SERTRALINE HCL 50 MG PO TABS
50.0000 mg | ORAL_TABLET | Freq: Every day | ORAL | Status: DC
  Administered 2012-05-15 – 2012-05-16 (×2): 50 mg via ORAL
  Filled 2012-05-15 (×6): qty 1

## 2012-05-15 MED ORDER — PANTOPRAZOLE SODIUM 40 MG PO TBEC
40.0000 mg | DELAYED_RELEASE_TABLET | Freq: Every day | ORAL | Status: DC
  Administered 2012-05-16 – 2012-05-21 (×6): 40 mg via ORAL
  Filled 2012-05-15 (×3): qty 1
  Filled 2012-05-15: qty 3
  Filled 2012-05-15 (×4): qty 1
  Filled 2012-05-15: qty 3

## 2012-05-15 MED ORDER — GABAPENTIN 300 MG PO CAPS
300.0000 mg | ORAL_CAPSULE | ORAL | Status: DC
  Administered 2012-05-15 – 2012-05-22 (×20): 300 mg via ORAL
  Filled 2012-05-15 (×2): qty 1
  Filled 2012-05-15: qty 9
  Filled 2012-05-15 (×12): qty 1
  Filled 2012-05-15: qty 9
  Filled 2012-05-15 (×3): qty 1
  Filled 2012-05-15: qty 9
  Filled 2012-05-15 (×3): qty 1
  Filled 2012-05-15: qty 9
  Filled 2012-05-15: qty 1
  Filled 2012-05-15: qty 9
  Filled 2012-05-15 (×3): qty 1
  Filled 2012-05-15: qty 9

## 2012-05-15 NOTE — Progress Notes (Addendum)
Patient is up out of bed presently; she had been lying in bed this morning c/o stomach ache.  She did not attend any groups this am and requested her meds bedside.  She is currently ambulating well.  Patient attended treatment team meeting and voiced her concern about missing her biweekly injection of prolixin.  She has missed several injections and states that injection helps with her "voices."  She denies any SI/HI; however she continues to endorse AH.  She rates her depression and hopelessness as an 8.  She continues to have urinary frequency. Patient received her 25/1 mg injection of prolixin.  Continue to monitor medication management and MD orders.  Safety checks completed q 15 minutes for safety. Administer 25/1 mg prolixin as ordered.  Patient is receptive to staff.  She is pleasant and cooperative.  Her behavior is appropriate.

## 2012-05-15 NOTE — Progress Notes (Deleted)
[facility] Group Notes:  (Counselor/Nursing/MHT/Case Management/Adjunct)  05/15/2012 2:15 PM  Type of Therapy: Group Therapy   Participation Level: Minimal   Participation Quality: Limited  Affect: Blunted   Cognitive: oriented, alert   Insight: minimal  Engagement in Group: Limited  Modes of Intervention: Clarification, Education, Problem-solving, Socialization, Activity, Encouragement and Support   Summary of Progress/Problems: Pt participated in group by listening attentively and self disclosing.  Therapist addressed the whole person concept of Wellness by prompting Pts to identify things that made them feel good in each of the following areas:  Physical, Mental, Emotional, and Spiritual.  Therapist encouraged Pts to make a list of things they can do each day to include positive activities in each area.  Therapist explained the Positive Affirmation Activity.  Pt actively participated by giving a positive affirmation to another Pt.  Pt was open to feedback from others, responded with a smile, and acknowledged elevation of mood. Therapist offered support and encouragement.  Some progress noted.  Intervention effective.         Betrice Wanat C 05/15/2012, 2:15 PM   

## 2012-05-15 NOTE — Progress Notes (Deleted)
[facility] Group Notes:  (Counselor/Nursing/MHT/Case Management/Adjunct)  05/15/2012 2:15 PM  Type of Therapy: Group Therapy   Participation Level:  Did not attend     Sherri Hays 05/15/2012, 2:15 PM

## 2012-05-15 NOTE — Treatment Plan (Signed)
Interdisciplinary Treatment Plan Update (Adult)  Date: 05/15/2012  Time Reviewed: 9:42 AM   Progress in Treatment: Attending groups: Yes Participating in groups: Yes Taking medication as prescribed: Yes Tolerating medication: Yes   Family/Significant othe contact made:  No Patient understands diagnosis:  Yes Discussing patient identified problems/goals with staff:  Yes Medical problems stabilized or resolved:  Yes Denies suicidal/homicidal ideation: Yes Issues/concerns per patient self-inventory:  Yes Other:  New problem(s) identified: N/A  Reason for Continuation of Hospitalization: Depression Hallucinations Medication stabilization  Interventions implemented related to continuation of hospitalization: Medication monitoring and adjustment, safety checks Q15 min., suicide risk assessment, group therapy, psychoeducation, collateral contact, aftercare planning, ongoing physician assessments, medication education  Additional comments:  Spoke to patient's ACT Team [facility], who stated that since patient left the assisted living and moved in with boyfriend, it has been a very bad situation.  She overdosed on 7 days worth of medication when got in an argument with boyfriend.  Estimated length of stay:  5-7 days  Discharge Plan:  Return to live with boyfriend, follow up with [facility]  New goal(s): N/A  Review of initial/current patient goals per problem list:   1.  Goal(s): Eliminate SI  Met:  Yes  Target date:7/15  As evidenced EA:[id number] denies SI today in tx team  2.  Goal (s): Eliminate psychosis  Met:  No, patient reports that she is still having auditory hallucinations, although she does not believe what they are saying  Target date:7/15  As evidenced JX:BJYNW denies psychosis, and there are no observable symptoms  3.  Goal(s):Determine safe plan for discharge  Met:  No  Target date:7/16  As evidenced [id number] to work with  ACT team and pt to determine plan that all agree with  4.  Goal(s):  Decrease depression to no greater than 3 at discharge  Met:  No  Target date:  By discharge  As evidenced by:  Today patient's depression is at 10  Attendees: Patient:  Sherri Hays 05/15/2012 9:42 AM  Family:     Physician: Franchot Gallo, MD 05/15/2012 9:42 AM   Nursing:   Joslyn Devon, RN 05/15/2012 9:42 AM   Case Manager:  Richelle Ito, LCSW 05/15/2012 9:42 AM   Counselor:  Marni Griffon, LCAS 05/15/2012 9:42 AM   Other:  Ambrose Mantle, LCSW 05/15/2012 9:42 AM  Other:  Tacy Learn, RN 05/15/2012   Other:     Other:      Scribe for Treatment Team:   Ida Rogue, 05/15/2012 9:42 AM

## 2012-05-15 NOTE — Progress Notes (Signed)
[facility] MD Progress Note  05/15/2012 3:27 PM   Diagnosis:  Axis I: Schizoaffective Disorder - Depressed Type.  The patient was seen today and reports the following:   ADL's: Intact.  Sleep: The patient reports to sleeping well last night without difficulty.  Appetite: The patient reports that her appetite is good today.   Mild>(1-10) >Severe  Hopelessness (1-10): 0  Depression (1-10): 10  Anxiety (1-10): 5   Suicidal Ideation: The patient denies any suicidal ideations today.  Plan: No  Intent: No  Means: No   Homicidal Ideation: The patient denies any homicidal ideations today.  Plan: No  Intent: No.  Means: No   General Appearance/Behavior: The patient was friendly and cooperative today with this provider.  Eye Contact: Good.  Speech: Appropriate in rate and volume today with no pressuring noted.  Motor Behavior: wnl.  Level of Consciousness: Alert and Oriented x 3.  Mental Status: Alert and Oriented x 3.  Mood: Appears severely depressed.  Affect: Essentially flat.  Anxiety Level: Moderate anxiety reported today.  Thought Process: wnl.  Thought Content: The patient denies any auditory or visual hallucinations today. She also denies any delusional thinking today.  Perception: wnl.  Judgment: Fair.  Insight: Fair.  Cognition: Oriented to person, place and time.  Sleep:  Number of Hours: 6.75    Vital Signs:Blood pressure 123/83, pulse 98, temperature 98 F (36.7 C), temperature source Oral, resp. rate 20, height 5\' 2"  (1.575 m), weight 90.719 kg (200 lb).  Current Medications: Current Facility-Administered Medications  Medication Dose Route Frequency Provider Last Rate Last Dose  . acetaminophen (TYLENOL) tablet 650 mg  650 mg Oral Q6H PRN Nehemiah Settle, MD      . albuterol (PROVENTIL HFA;VENTOLIN HFA) 108 (90 BASE) MCG/ACT inhaler 2 puff  2 puff Inhalation Q6H PRN Nehemiah Settle, MD   2 puff at 05/14/12 2031  . alum & mag hydroxide-simeth  (MAALOX/MYLANTA) 200-200-20 MG/5ML suspension 30 mL  30 mL Oral Q4H PRN Nehemiah Settle, MD   30 mL at 05/15/12 0945  . amLODipine (NORVASC) tablet 10 mg  10 mg Oral QHS Nehemiah Settle, MD   10 mg at 05/14/12 2146  . atorvastatin (LIPITOR) tablet 10 mg  10 mg Oral QHS Nehemiah Settle, MD   10 mg at 05/14/12 2146  . diclofenac (VOLTAREN) EC tablet 75 mg  75 mg Oral BID Nehemiah Settle, MD   75 mg at 05/15/12 0943  . fluPHENAZine (PROLIXIN) tablet 10 mg  10 mg Oral QHS Nehemiah Settle, MD   10 mg at 05/14/12 2145  . Fluticasone-Salmeterol (ADVAIR) 250-50 MCG/DOSE inhaler 1 puff  1 puff Inhalation BID Nehemiah Settle, MD   1 puff at 05/13/12 0852  . gabapentin (NEURONTIN) capsule 300 mg  300 mg Oral TID Nehemiah Settle, MD   300 mg at 05/15/12 1203  . hydrALAZINE (APRESOLINE) tablet 50 mg  50 mg Oral TID Nehemiah Settle, MD   50 mg at 05/15/12 1500  . hydrOXYzine (ATARAX/VISTARIL) tablet 50 mg  50 mg Oral QHS PRN,MR X 1 Nehemiah Settle, MD   50 mg at 05/13/12 0233  . insulin glargine (LANTUS) injection 30 Units  30 Units Subcutaneous QHS Nehemiah Settle, MD   30 Units at 05/14/12 2146  . magnesium hydroxide (MILK OF MAGNESIA) suspension 30 mL  30 mL Oral Daily PRN Nehemiah Settle, MD      . metFORMIN (GLUCOPHAGE) tablet 500 mg  500  mg Oral BID WC Nehemiah Settle, MD   500 mg at 05/15/12 0942  . pantoprazole (PROTONIX) EC tablet 40 mg  40 mg Oral Q1200 Nehemiah Settle, MD   40 mg at 05/15/12 1203  . traZODone (DESYREL) tablet 100 mg  100 mg Oral QHS Nehemiah Settle, MD   100 mg at 05/14/12 2146  . trihexyphenidyl (ARTANE) tablet 1 mg  1 mg Oral QHS Nehemiah Settle, MD   1 mg at 05/14/12 2145   Lab Results:  Results for orders placed during the hospital encounter of 05/12/12 (from the past 48 hour(s))  GLUCOSE, CAPILLARY     Status: Abnormal   Collection  Time   05/13/12  5:14 PM      Component Value Range Comment   Glucose-Capillary 167 (*) 70 - 99 mg/dL    Comment 1 Notify RN     GLUCOSE, CAPILLARY     Status: Abnormal   Collection Time   05/13/12  9:50 PM      Component Value Range Comment   Glucose-Capillary 153 (*) 70 - 99 mg/dL    Comment 1 Notify RN     GLUCOSE, CAPILLARY     Status: Abnormal   Collection Time   05/14/12  6:31 AM      Component Value Range Comment   Glucose-Capillary 114 (*) 70 - 99 mg/dL   GLUCOSE, CAPILLARY     Status: Normal   Collection Time   05/14/12 11:35 AM      Component Value Range Comment   Glucose-Capillary 93  70 - 99 mg/dL   GLUCOSE, CAPILLARY     Status: Abnormal   Collection Time   05/14/12  5:12 PM      Component Value Range Comment   Glucose-Capillary 170 (*) 70 - 99 mg/dL    Comment 1 Documented in Chart      Comment 2 Notify RN     GLUCOSE, CAPILLARY     Status: Abnormal   Collection Time   05/14/12  9:30 PM      Component Value Range Comment   Glucose-Capillary 172 (*) 70 - 99 mg/dL   GLUCOSE, CAPILLARY     Status: Abnormal   Collection Time   05/15/12  6:33 AM      Component Value Range Comment   Glucose-Capillary 120 (*) 70 - 99 mg/dL   GLUCOSE, CAPILLARY     Status: Abnormal   Collection Time   05/15/12 11:53 AM      Component Value Range Comment   Glucose-Capillary 106 (*) 70 - 99 mg/dL    Comment 1 Notify RN      Physical Findings: AIMS: Facial and Oral Movements Muscles of Facial Expression: None, normal Lips and Perioral Area: None, normal Jaw: None, normal Tongue: None, normal,Extremity Movements Upper (arms, wrists, hands, fingers): None, normal Lower (legs, knees, ankles, toes): None, normal, Trunk Movements Neck, shoulders, hips: None, normal, Overall Severity Severity of abnormal movements (highest score from questions above): None, normal Incapacitation due to abnormal movements: None, normal Patient's awareness of abnormal movements (rate only patient's report):  No Awareness, Dental Status Current problems with teeth and/or dentures?: No Does patient usually wear dentures?: No   Review of Systems:  Neurological: The patient denies any headaches today. She denies any seizures or dizziness.  G.I.: The patient denies any constipation or G.I. Upset today.  Musculoskeletal: The patient denies any musculoskeletal issues today.   Time was spent today discussing with the patient her  current symptoms. The patient states that she slept well last night and reports a good appetite. She reports severe feelings of sadness, anhedonia and depressed mood and reports moderate anxiety symptoms today.  She denies any suicidal or homicidal ideations today as well as any auditory or visual hallucinations or delusional thinking. She denies any medication related side effects today or other concerns.   Treatment Plan Summary:  1. Daily contact with patient to assess and evaluate symptoms and progress in treatment.  2. Medication management  3. The patient will deny suicidal ideations or homicidal ideations for 48 hours prior to discharge and have a depression and anxiety rating of 3 or less. The patient will also deny any auditory or visual hallucinations or delusional thinking.  4. The patient will deny any symptoms of substance withdrawal at time of discharge.   Plan:  1. Will continue the patient on her current medications as listed above. 2. Will start Zoloft 50 mgs po q am for depression. 3. Laboratory studies reviewed.  4. Will order a TSH, Free T3 and Free T4. 5. Will continue to monitor.   Ariea Rochin 05/15/2012, 3:27 PM

## 2012-05-15 NOTE — Progress Notes (Signed)
[facility] Group Notes:  (Counselor/Nursing/MHT/Case Management/Adjunct)  05/15/2012 2:15 PM  Type of Therapy: Group Therapy   Participation Level: Minimal   Participation Quality: Limited  Affect: Blunted, depressed   Cognitive: oriented, alert   Insight: minimal  Engagement in Group: Limited  Modes of Intervention: Clarification, Education, Problem-solving, Socialization, Activity, Encouragement and Support   Summary of Progress/Problems: Pt participated in group by listening attentively and self disclosing.  Therapist addressed the whole person concept of Wellness by prompting Pts to identify things that made them feel good in each of the following areas:  Physical, Mental, Emotional, and Spiritual.  Therapist encouraged Pts to make a list of things they can do each day to include positive activities in each area.  Therapist explained the Positive Affirmation Activity.  Pt actively participated by giving a positive affirmation to another Pt.  Pt was open to feedback from others, responded with a smile, and acknowledged elevation of mood. Therapist offered support and encouragement.  Some progress noted.  Intervention effective.         Marni Griffon C 05/15/2012, 2:15 PM

## 2012-05-15 NOTE — Progress Notes (Signed)
[facility] Group Notes:  (Counselor/Nursing/MHT/Case Management/Adjunct)  05/15/2012 1:12 AM  Type of Therapy:  wrap-up  Participation Level:  Active  Participation Quality:  Appropriate  Affect:  Appropriate  Cognitive:  Appropriate  Insight:  Good  Engagement in Group:  Good  Engagement in Therapy:  Good  Modes of Intervention:  Education  Summary of Progress/Problems:Pt attended and participated in our wrap-up group this evening. She reported that her day all right. She sung gospel music, read the bible and talked with her boyfriend today. The topic for today was support systems. The patient reports that her support system was her boyfriend.   Lita Mains [facility] 05/15/2012, 1:12 AM

## 2012-05-15 NOTE — Progress Notes (Signed)
Nsg note 7p-7a-D-Patient happy that she received her prolixin shot today. Is hoping this will make "the demons" go away quickly. Patient observed interacting on the unit and is very pleasant. Patient stated "I want to get better and go home." MHT reported to writer this evening that patient complained of her legs moving around in the bed. A-Writer spoke with patient about her complaint. Patient stated "The demons are messing with my legs." Patient given vistaril 50 mg to help with sleep and anxiety. She was receptive to emotional support regarding her symptoms.  R-Patient is quietly resting in bed at present. Will continue to monitor closely.

## 2012-05-15 NOTE — Progress Notes (Signed)
Psychoeducational Group Note  Date:  05/15/2012 Time:  2030  Group Topic/Focus:  Wrap-Up Group:   The focus of this group is to help patients review their daily goal of treatment and discuss progress on daily workbooks.  Participation Level:  Active  Participation Quality:  Appropriate and Sharing  Affect:  Appropriate and Excited  Cognitive:  Appropriate  Insight:  Good  Engagement in Group:  Good  Additional Comments:  Pt attended wrap up group and stated "I had a good day. I got to meet with my doctors about changing my medication because I still hear the voices." Pt also stated that it made her feel better talking with her nurse.   Dalia Heading 05/15/2012, 11:13 PM

## 2012-05-15 NOTE — Progress Notes (Signed)
Psychoeducational Group Note  Date:  05/15/2012 Time:  11.00  Group Topic/Focus:  Self Care:   The focus of this group is to help patients understand the importance of self-care in order to improve or restore emotional, physical, spiritual, interpersonal, and financial health.  Participation Level:  Did Not Attend  Participation Quality:    Affect:    Cognitive:    Insight:    Engagement in Group:    Additional Comments:  None   Sherri Hays, Sherri Hays 05/15/2012, 5:32 PM

## 2012-05-16 LAB — TSH: TSH: 2.081 u[IU]/mL (ref 0.350–4.500)

## 2012-05-16 LAB — T3, FREE: T3, Free: 2 pg/mL — ABNORMAL LOW (ref 2.3–4.2)

## 2012-05-16 LAB — GLUCOSE, CAPILLARY

## 2012-05-16 MED ORDER — OXYBUTYNIN CHLORIDE ER 5 MG PO TB24
5.0000 mg | ORAL_TABLET | Freq: Every day | ORAL | Status: DC
  Administered 2012-05-16 – 2012-05-17 (×2): 5 mg via ORAL
  Filled 2012-05-16 (×5): qty 1

## 2012-05-16 MED ORDER — SERTRALINE HCL 100 MG PO TABS
100.0000 mg | ORAL_TABLET | Freq: Every day | ORAL | Status: DC
  Administered 2012-05-17 – 2012-05-18 (×2): 100 mg via ORAL
  Filled 2012-05-16 (×5): qty 1

## 2012-05-16 MED ORDER — TRAZODONE HCL 150 MG PO TABS
150.0000 mg | ORAL_TABLET | Freq: Every day | ORAL | Status: DC
  Administered 2012-05-16: 150 mg via ORAL
  Filled 2012-05-16 (×2): qty 1

## 2012-05-16 NOTE — Progress Notes (Signed)
Psychoeducational Group Note  Date:  05/16/2012 Time:  1100  Group Topic/Focus:  Recovery Goals:   The focus of this group is to identify appropriate goals for recovery and establish a plan to achieve them.  Participation Level: Did Not Attend  Participation Quality:  Not Applicable  Affect:  Not Applicable  Cognitive:  Not Applicable  Insight:  Not Applicable  Engagement in Group: Not Applicable  Additional Comments:  Pt was extremely sleepy and couldn't attend group this morning.   Toniqua Melamed E 05/16/2012, 3:48 PM

## 2012-05-16 NOTE — Progress Notes (Signed)
Patient continues to have AH with "demons" telling her what to do.  She has been pleasant and cooperative with staff.  She denies any SI/HI and continues to hear voices.  She is concerned that the prolixin shot is not working.  Reviewed her medications with her and explained that they need time to work.  Patient states her depression is a 8 and her hopelessness as an 8.  Her only physical complaint is her urinary urgency.    Continue to monitor medication management and MD orders.  Maintain 15 minute safety checks.  Patient is receptive to staff; appreciative and pleasant.  Her behavior is appropriate.

## 2012-05-16 NOTE — Progress Notes (Signed)
[facility] Group Notes:  (Counselor/Nursing/MHT/Case Management/Adjunct)  05/16/2012  10:30  AM  Type of Therapy: Group Therapy   Participation Level:  Did not attend.  Was asleep.      Marni Griffon C 05/16/2012  10:30 AM

## 2012-05-16 NOTE — Progress Notes (Signed)
[facility] MD Progress Note  05/16/2012 4:34 PM  Diagnosis:  Axis I: Schizoaffective Disorder - Depressed Type.   The patient was seen today and reports the following:   ADL's: Intact.  Sleep: The patient reports to sleeping well last night without difficulty.  Appetite: The patient reports that her appetite is good today.   Mild>(1-10) >Severe  Hopelessness (1-10): 10  Depression (1-10): 8  Anxiety (1-10): 0   Suicidal Ideation: The patient denies any suicidal ideations today.  Plan: No  Intent: No  Means: No   Homicidal Ideation: The patient denies any homicidal ideations today.  Plan: No  Intent: No.  Means: No   General Appearance/Behavior: The patient remained friendly and cooperative today with this provider.  Eye Contact: Good.  Speech: Appropriate in rate and volume today with no pressuring noted.  Motor Behavior: wnl.  Level of Consciousness: Alert and Oriented x 3.  Mental Status: Alert and Oriented x 3.  Mood: Appears severely depressed.  Affect: Essentially flat.  Anxiety Level: No anxiety reported today.  Thought Process: Auditory hallucinations reported.  Thought Content: The patient denies any visual hallucinations or any delusional thinking today.  She reports "the voices are bad." Perception: Auditory hallucinations reported.   Judgment: Fair.  Insight: Fair.  Cognition: Oriented to person, place and time.  Sleep:  Number of Hours: 4.5    Vital Signs:Blood pressure 147/75, pulse 98, temperature 98.9 F (37.2 C), temperature source Oral, resp. rate 20, height 5\' 2"  (1.575 m), weight 90.719 kg (200 lb).  Current Medications: Current Facility-Administered Medications  Medication Dose Route Frequency Provider Last Rate Last Dose  . acetaminophen (TYLENOL) tablet 650 mg  650 mg Oral Q6H PRN Nehemiah Settle, MD      . albuterol (PROVENTIL HFA;VENTOLIN HFA) 108 (90 BASE) MCG/ACT inhaler 2 puff  2 puff Inhalation Q6H PRN Nehemiah Settle, MD   2  puff at 05/14/12 2031  . alum & mag hydroxide-simeth (MAALOX/MYLANTA) 200-200-20 MG/5ML suspension 30 mL  30 mL Oral Q4H PRN Nehemiah Settle, MD   30 mL at 05/15/12 0945  . amLODipine (NORVASC) tablet 10 mg  10 mg Oral QHS Nehemiah Settle, MD   10 mg at 05/15/12 2200  . atorvastatin (LIPITOR) tablet 10 mg  10 mg Oral QHS Nehemiah Settle, MD   10 mg at 05/15/12 2200  . diclofenac (VOLTAREN) EC tablet 75 mg  75 mg Oral BID Nehemiah Settle, MD   75 mg at 05/16/12 0826  . fluPHENAZine (PROLIXIN) tablet 10 mg  10 mg Oral QHS Nehemiah Settle, MD   10 mg at 05/15/12 2200  . fluPHENAZine decanoate (PROLIXIN) injection 25 mg  25 mg Intramuscular Q14 Days Curlene Labrum Laquilla Dault, MD   25 mg at 05/15/12 1619  . Fluticasone-Salmeterol (ADVAIR) 250-50 MCG/DOSE inhaler 1 puff  1 puff Inhalation BID Nehemiah Settle, MD   1 puff at 05/13/12 0852  . gabapentin (NEURONTIN) capsule 300 mg  300 mg Oral [id number] Curlene Labrum Jere Bostrom, MD   300 mg at 05/16/12 1615  . hydrALAZINE (APRESOLINE) tablet 50 mg  50 mg Oral [id number] Curlene Labrum Skyelar Halliday, MD   50 mg at 05/16/12 1616  . hydrOXYzine (ATARAX/VISTARIL) tablet 50 mg  50 mg Oral QHS PRN,MR X 1 Nehemiah Settle, MD   50 mg at 05/16/12 0138  . insulin glargine (LANTUS) injection 30 Units  30 Units Subcutaneous QHS Nehemiah Settle, MD   30 Units at 05/15/12 2203  .  magnesium hydroxide (MILK OF MAGNESIA) suspension 30 mL  30 mL Oral Daily PRN Nehemiah Settle, MD      . metFORMIN (GLUCOPHAGE) tablet 500 mg  500 mg Oral BID WC Nehemiah Settle, MD   500 mg at 05/16/12 0828  . nicotine polacrilex (NICORETTE) gum 2 mg  2 mg Oral PRN Curlene Labrum Idelle Reimann, MD      . oxybutynin (DITROPAN-XL) 24 hr tablet 5 mg  5 mg Oral QHS Ariyan Brisendine D Braylyn Eye, MD      . pantoprazole (PROTONIX) EC tablet 40 mg  40 mg Oral Q2200 Curlene Labrum Chezney Huether, MD      . sertraline (ZOLOFT) tablet 50 mg  50 mg Oral Daily Curlene Labrum  Koya Hunger, MD   50 mg at 05/16/12 0827  . traZODone (DESYREL) tablet 150 mg  150 mg Oral QHS Orli Degrave D Jaimeson Gopal, MD      . trihexyphenidyl (ARTANE) tablet 1 mg  1 mg Oral QHS Nehemiah Settle, MD   1 mg at 05/15/12 2200  . DISCONTD: traZODone (DESYREL) tablet 100 mg  100 mg Oral QHS Nehemiah Settle, MD   100 mg at 05/15/12 2200   Lab Results:  Results for orders placed during the hospital encounter of 05/12/12 (from the past 48 hour(s))  GLUCOSE, CAPILLARY     Status: Abnormal   Collection Time   05/14/12  5:12 PM      Component Value Range Comment   Glucose-Capillary 170 (*) 70 - 99 mg/dL    Comment 1 Documented in Chart      Comment 2 Notify RN     GLUCOSE, CAPILLARY     Status: Abnormal   Collection Time   05/14/12  9:30 PM      Component Value Range Comment   Glucose-Capillary 172 (*) 70 - 99 mg/dL   GLUCOSE, CAPILLARY     Status: Abnormal   Collection Time   05/15/12  6:33 AM      Component Value Range Comment   Glucose-Capillary 120 (*) 70 - 99 mg/dL   GLUCOSE, CAPILLARY     Status: Abnormal   Collection Time   05/15/12 11:53 AM      Component Value Range Comment   Glucose-Capillary 106 (*) 70 - 99 mg/dL    Comment 1 Notify RN     GLUCOSE, CAPILLARY     Status: Abnormal   Collection Time   05/15/12  5:05 PM      Component Value Range Comment   Glucose-Capillary 275 (*) 70 - 99 mg/dL   TSH     Status: Normal   Collection Time   05/15/12  8:00 PM      Component Value Range Comment   TSH 2.081  0.350 - 4.500 uIU/mL   T3, FREE     Status: Abnormal   Collection Time   05/15/12  8:00 PM      Component Value Range Comment   T3, Free 2.0 (*) 2.3 - 4.2 pg/mL   T4, FREE     Status: Normal   Collection Time   05/15/12  8:00 PM      Component Value Range Comment   Free T4 1.13  0.80 - 1.80 ng/dL   GLUCOSE, CAPILLARY     Status: Abnormal   Collection Time   05/15/12  9:51 PM      Component Value Range Comment   Glucose-Capillary 128 (*) 70 - 99 mg/dL   GLUCOSE,  CAPILLARY     Status:  Normal   Collection Time   05/16/12  6:12 AM      Component Value Range Comment   Glucose-Capillary 72  70 - 99 mg/dL   GLUCOSE, CAPILLARY     Status: Normal   Collection Time   05/16/12 12:04 PM      Component Value Range Comment   Glucose-Capillary 82  70 - 99 mg/dL    Physical Findings: AIMS: Facial and Oral Movements Muscles of Facial Expression: None, normal Lips and Perioral Area: None, normal Jaw: None, normal Tongue: None, normal,Extremity Movements Upper (arms, wrists, hands, fingers): None, normal Lower (legs, knees, ankles, toes): None, normal, Trunk Movements Neck, shoulders, hips: None, normal, Overall Severity Severity of abnormal movements (highest score from questions above): None, normal Incapacitation due to abnormal movements: None, normal Patient's awareness of abnormal movements (rate only patient's report): No Awareness, Dental Status Current problems with teeth and/or dentures?: No Does patient usually wear dentures?: No   Review of Systems:  Neurological: The patient denies any headaches today. She denies any seizures or dizziness.  G.I.: The patient denies any constipation or G.I. Upset today.  Musculoskeletal: The patient denies any musculoskeletal issues today.  GU:  Urinary Incontinence reported.  Time was spent today discussing with the patient her current symptoms. The patient states that she slept well last night and reports a good appetite but states she "wet myself."  She asked today if a medication can be prescribed to help with her urinary incontinence.  She reports severe feelings of sadness, anhedonia and depressed mood but denies any anxiety symptoms today. She denies any suicidal or homicidal ideations today as well as any visual hallucinations or delusional thinking. She does report auditory hallucinations today which she reports "are bad today."  She denies any medication related side effects today or other concerns.    Treatment Plan Summary:  1. Daily contact with patient to assess and evaluate symptoms and progress in treatment.  2. Medication management  3. The patient will deny suicidal ideations or homicidal ideations for 48 hours prior to discharge and have a depression and anxiety rating of 3 or less. The patient will also deny any auditory or visual hallucinations or delusional thinking.  4. The patient will deny any symptoms of substance withdrawal at time of discharge.   Plan:  1. Will continue the patient on her current non-psychiatric medications as listed above.  2. Will increase the medication Zoloft to 150 mgs po q am for depression.  3. Will start the medication Ditropan XL at 5 mgs po qhs for urinary incontinence. 4. Will continue the medication Prolixin Decanoate 25 mgs IM q 2 weeks with this last given May 15, 2012 for auditory hallucinations. 5. Will continue the medication Prolixin 10 mgs po qhs for auditory hallucinations. 6. Will continue the medication Artane at 1 mg po qhs for EPS. 7. Will continue the medication Trazodone at 150 mgs po qhs for sleep. 8. Will continue the medication Neurontin at 300 mgs po q am, 2 pm and hs for anxiety and pain. 9. Laboratory studies reviewed.  10. Will continue to monitor.   Darcy Barbara 05/16/2012, 4:34 PM

## 2012-05-16 NOTE — Progress Notes (Signed)
D: Pt pleasant and cooperative with staff.  Patient in dayroom during initial shift assessment tonight.  Patient open with Clinical research associate and happy.  Patient states, "I'm not hearing the voices anymore." Patient stated the previous statement several times to Clinical research associate.  Patient remains disheveled but cooperative.  Patient denies SI/HI and denies AVH at this time. When writer asked pt what she has learned since she has been at [facility] pt states, "If we stick together we will get farther in life." Pt did not elaborate further.  A: Patient offered encouragement and support.  Staff to monitor Q 15 mins for safety. Scheduled medications administered per MD orders.   R: Patient remains safe on the unit.  Patient taking all scheduled medications per MD orders.  Patient attended wrap-up group tonight.

## 2012-05-16 NOTE — Progress Notes (Signed)
[facility] Group Notes:  (Counselor/Nursing/MHT/Case Management/Adjunct)  05/16/2012 10:15 PM  Type of Therapy:  Group Therapy  Participation Level:  Active  Participation Quality:  Attentive and Sharing  Affect:  Appropriate  Cognitive:  Alert  Insight:  Good  Engagement in Group:  Good  Engagement in Therapy:  Good  Modes of Intervention:  Clarification and Support  Summary of Progress/Problems: Pt was able to report how her day was going and how she was going to use what coping skills she has learned to assist with her recovery process.  Kimball Manske, Randal Buba 05/16/2012, 10:15 PM

## 2012-05-17 LAB — GLUCOSE, CAPILLARY
Glucose-Capillary: 118 mg/dL — ABNORMAL HIGH (ref 70–99)
Glucose-Capillary: 222 mg/dL — ABNORMAL HIGH (ref 70–99)

## 2012-05-17 MED ORDER — TRAZODONE HCL 100 MG PO TABS
200.0000 mg | ORAL_TABLET | Freq: Every day | ORAL | Status: DC
  Administered 2012-05-17 – 2012-05-21 (×5): 200 mg via ORAL
  Filled 2012-05-17 (×4): qty 2
  Filled 2012-05-17: qty 6
  Filled 2012-05-17 (×2): qty 2
  Filled 2012-05-17: qty 6

## 2012-05-17 NOTE — Discharge Planning (Signed)
Patient asks numerous times how/where she is going to get her injection, and was informed by Case Manager that the ACT Team is going to come to her home to give her the injection every two weeks.  She likes this idea, but asks again and again the same question.  Patient also reports that her voices are very bad and are telling her to urinate, so she gets up and does so.    Case Manager called and spoke with [facility] ACT Team RN and confirmed that they will begin giving her injections every 2 weeks as soon as patient is discharged.  The sister has asked hospital staff "what is going on with the ACT Team" so Case Manager asked for some history re same.  Apparently patient was released from [facility] about 6 weeks in response to the state's movement on getting mentally ill patients out of assisted living facilities.  She lived independently for 3 weeks, then the ACT Team was brought into the situation about 3 weeks ago.  When they first started working with patient, the ALF had not included patient's insulin on her medication list, so they were not aware that she was supposed to be getting that.  There were a number of circumstances which contributed to a slow start with the patient and her not getting her Prolixin injection in the last 3 weeks.  The RN did request that [facility] staff make a Home Health referral for patient's diabetes, stating she needs help in diabetes management as well as diabetes education, as the ACT Team is limited in what they can do in that respect.  Case Manager agreed to do this.  Case Manager attempted again to call Milinda Antis at [facility], who left a message yesterday as patient's case manager at the [facility].  Awaiting call back.  Called patient's sister Camile Esters and gave her the information above, as she had requested to know "what's going on with the ACT Team, and how did my sister get in this shape?"  Sister is very concerned, and repeated several times that  patient keeps telling the family she became psychotic due solely to missing her Prolixin injection.  We can stay in touch with sister as more information is forthcoming, [phone].  She has asked that Case Manager put this information into writing and email to her, and Case Manager will do this only after talking with doctor and supervisors about what is appropriate to include, if anything.  Ambrose Mantle, LCSW 05/17/2012, 2:22 PM

## 2012-05-17 NOTE — Therapy (Signed)
Psychoeducational Group Note  Date:  05/17/2012 Time:  2030  Group Topic/Focus:  Wrap-Up Group:   The focus of this group is to help patients review their daily goal of treatment and discuss progress on daily workbooks.  Participation Level:  Active  Participation Quality:  Appropriate  Affect:  Appropriate  Cognitive:  Appropriate  Insight:  Good  Engagement in Group:  Good  Additional Comments:  Patient attended and participated in wrap-up group this evening.  Lamonda Noxon, Newton Pigg 05/17/2012, 9:07 PM

## 2012-05-17 NOTE — Progress Notes (Signed)
Psychoeducational Group Note  Date:  05/17/2012 Time: 1100  Group Topic/Focus:  Emotional Education:   The focus of this group is to discuss what feelings/emotions are, and how they are experienced.  Participation Level:  Active  Participation Quality:  Appropriate  Affect:  Appropriate  Cognitive:  Appropriate  Insight:  Good  Engagement in Group:  Good  Additional Comments:  none  Jovoni Borkenhagen, Genia Plants 05/17/2012, 12:11 PM

## 2012-05-17 NOTE — Progress Notes (Signed)
D: Pt denies SI/HI/AV. Pt is pleasant and cooperative.Pt attends groups. Pt is a little animated.   A: Pt was offered support and encouragement. Pt was given scheduled medications. Pt was encourage to attend groups. Q 15 minute checks were done for safety.  R:Pt interacts well with peers and staff. Pt is taking medication. Pt has no complaints.Pt receptive to treatment and safety maintained on unit.

## 2012-05-17 NOTE — Progress Notes (Signed)
Date: 05/17/2012  Time: 0930   Group Topic/Focus: The focus of this group is on enhancing the patient's understanding of leisure, barriers to leisure, and the importance of engaging in positive leisure activities upon discharge for improved total health.   Participation Level:  Minimal  Participation Quality:  Attentive  Affect:  Excited  Cognitive:  Alert   Additional Comments: Patient very bright, laughing inappropriately at times, didn't engage in the activity but would echo comments made by peers.  Izic Stfort  05/17/2012 11:37 AM

## 2012-05-17 NOTE — Progress Notes (Signed)
[facility] MD Progress Note  05/17/2012 1:00 PM  Diagnosis:  Axis I: Schizoaffective Disorder - Depressed Type.   The patient was seen today and reports the following:   ADL's: Intact.  Sleep: The patient reports to having some difficulty initiating and maintaining sleep.  Appetite: The patient reports that her appetite is good today.   Mild>(1-10) >Severe  Hopelessness (1-10): 8  Depression (1-10): 8  Anxiety (1-10): 0   Suicidal Ideation: The patient denies any suicidal ideations today.  Plan: No  Intent: No  Means: No   Homicidal Ideation: The patient denies any homicidal ideations today.  Plan: No  Intent: No.  Means: No   General Appearance/Behavior: The patient remains friendly and cooperative today with this provider.  Eye Contact: Good.  Speech: Appropriate in rate and volume today with no pressuring noted.  Motor Behavior: wnl.  Level of Consciousness: Alert and Oriented x 3.  Mental Status: Alert and Oriented x 3.  Mood: Appears severely depressed.  Affect: Moderately constricted.  Anxiety Level: No anxiety reported today.  Thought Process: Auditory hallucinations reported.  Thought Content: The patient denies any visual hallucinations or any delusional thinking today. She reports the continuation of her voices.  Perception: Auditory hallucinations reported.  Judgment: Fair.  Insight: Fair.  Cognition: Oriented to person, place and time.   Vital Signs:Blood pressure 142/79, pulse 89, temperature 97.8 F (36.6 C), temperature source Oral, resp. rate 20, height 5\' 2"  (1.575 m), weight 90.719 kg (200 lb).  Current Medications: Current Facility-Administered Medications  Medication Dose Route Frequency Provider Last Rate Last Dose  . acetaminophen (TYLENOL) tablet 650 mg  650 mg Oral Q6H PRN Nehemiah Settle, MD      . albuterol (PROVENTIL HFA;VENTOLIN HFA) 108 (90 BASE) MCG/ACT inhaler 2 puff  2 puff Inhalation Q6H PRN Nehemiah Settle, MD   2 puff  at 05/14/12 2031  . alum & mag hydroxide-simeth (MAALOX/MYLANTA) 200-200-20 MG/5ML suspension 30 mL  30 mL Oral Q4H PRN Nehemiah Settle, MD   30 mL at 05/15/12 0945  . amLODipine (NORVASC) tablet 10 mg  10 mg Oral QHS Nehemiah Settle, MD   10 mg at 05/16/12 2137  . atorvastatin (LIPITOR) tablet 10 mg  10 mg Oral QHS Nehemiah Settle, MD   10 mg at 05/16/12 2136  . diclofenac (VOLTAREN) EC tablet 75 mg  75 mg Oral BID Nehemiah Settle, MD   75 mg at 05/17/12 0811  . fluPHENAZine (PROLIXIN) tablet 10 mg  10 mg Oral QHS Nehemiah Settle, MD   10 mg at 05/16/12 2137  . fluPHENAZine decanoate (PROLIXIN) injection 25 mg  25 mg Intramuscular Q14 Days Curlene Labrum Tavaris Eudy, MD   25 mg at 05/15/12 1619  . Fluticasone-Salmeterol (ADVAIR) 250-50 MCG/DOSE inhaler 1 puff  1 puff Inhalation BID Nehemiah Settle, MD   1 puff at 05/17/12 0810  . gabapentin (NEURONTIN) capsule 300 mg  300 mg Oral [id number] Curlene Labrum Jermone Geister, MD   300 mg at 05/17/12 0811  . hydrALAZINE (APRESOLINE) tablet 50 mg  50 mg Oral [id number] Curlene Labrum Libero Puthoff, MD   50 mg at 05/17/12 0811  . hydrOXYzine (ATARAX/VISTARIL) tablet 50 mg  50 mg Oral QHS PRN,MR X 1 Nehemiah Settle, MD   50 mg at 05/16/12 0138  . insulin glargine (LANTUS) injection 30 Units  30 Units Subcutaneous QHS Nehemiah Settle, MD   30 Units at 05/16/12 2136  . magnesium hydroxide (MILK OF MAGNESIA) suspension 30  mL  30 mL Oral Daily PRN Nehemiah Settle, MD      . metFORMIN (GLUCOPHAGE) tablet 500 mg  500 mg Oral BID WC Nehemiah Settle, MD   500 mg at 05/17/12 0811  . nicotine polacrilex (NICORETTE) gum 2 mg  2 mg Oral PRN Curlene Labrum Alajah Witman, MD      . oxybutynin (DITROPAN-XL) 24 hr tablet 5 mg  5 mg Oral QHS Curlene Labrum Charon Smedberg, MD   5 mg at 05/16/12 2136  . pantoprazole (PROTONIX) EC tablet 40 mg  40 mg Oral Q2200 Curlene Labrum Greysyn Vanderberg, MD   40 mg at 05/16/12 2137  . sertraline (ZOLOFT) tablet  100 mg  100 mg Oral Daily Curlene Labrum Dejon Jungman, MD   100 mg at 05/17/12 0811  . traZODone (DESYREL) tablet 200 mg  200 mg Oral QHS Jakhi Dishman D Laia Wiley, MD      . trihexyphenidyl (ARTANE) tablet 1 mg  1 mg Oral QHS Nehemiah Settle, MD   1 mg at 05/16/12 2137  . DISCONTD: sertraline (ZOLOFT) tablet 50 mg  50 mg Oral Daily Curlene Labrum Ishani Goldwasser, MD   50 mg at 05/16/12 0827  . DISCONTD: traZODone (DESYREL) tablet 100 mg  100 mg Oral QHS Nehemiah Settle, MD   100 mg at 05/15/12 2200  . DISCONTD: traZODone (DESYREL) tablet 150 mg  150 mg Oral QHS Curlene Labrum Rockey Guarino, MD   150 mg at 05/16/12 2137   Lab Results:  Results for orders placed during the hospital encounter of 05/12/12 (from the past 48 hour(s))  GLUCOSE, CAPILLARY     Status: Abnormal   Collection Time   05/15/12  5:05 PM      Component Value Range Comment   Glucose-Capillary 275 (*) 70 - 99 mg/dL   TSH     Status: Normal   Collection Time   05/15/12  8:00 PM      Component Value Range Comment   TSH 2.081  0.350 - 4.500 uIU/mL   T3, FREE     Status: Abnormal   Collection Time   05/15/12  8:00 PM      Component Value Range Comment   T3, Free 2.0 (*) 2.3 - 4.2 pg/mL   T4, FREE     Status: Normal   Collection Time   05/15/12  8:00 PM      Component Value Range Comment   Free T4 1.13  0.80 - 1.80 ng/dL   GLUCOSE, CAPILLARY     Status: Abnormal   Collection Time   05/15/12  9:51 PM      Component Value Range Comment   Glucose-Capillary 128 (*) 70 - 99 mg/dL   GLUCOSE, CAPILLARY     Status: Normal   Collection Time   05/16/12  6:12 AM      Component Value Range Comment   Glucose-Capillary 72  70 - 99 mg/dL   GLUCOSE, CAPILLARY     Status: Normal   Collection Time   05/16/12 12:04 PM      Component Value Range Comment   Glucose-Capillary 82  70 - 99 mg/dL   GLUCOSE, CAPILLARY     Status: Normal   Collection Time   05/16/12  5:06 PM      Component Value Range Comment   Glucose-Capillary 83  70 - 99 mg/dL    Comment 1 Notify  RN     GLUCOSE, CAPILLARY     Status: Abnormal   Collection Time   05/16/12  8:37  PM      Component Value Range Comment   Glucose-Capillary 170 (*) 70 - 99 mg/dL    Comment 1 Notify RN      Comment 2 Documented in Chart     GLUCOSE, CAPILLARY     Status: Abnormal   Collection Time   05/17/12  5:57 AM      Component Value Range Comment   Glucose-Capillary 118 (*) 70 - 99 mg/dL    Comment 1 Notify RN     GLUCOSE, CAPILLARY     Status: Abnormal   Collection Time   05/17/12 12:11 PM      Component Value Range Comment   Glucose-Capillary 222 (*) 70 - 99 mg/dL    Physical Findings: AIMS: Facial and Oral Movements Muscles of Facial Expression: None, normal Lips and Perioral Area: None, normal Jaw: None, normal Tongue: None, normal,Extremity Movements Upper (arms, wrists, hands, fingers): None, normal Lower (legs, knees, ankles, toes): None, normal, Trunk Movements Neck, shoulders, hips: None, normal, Overall Severity Severity of abnormal movements (highest score from questions above): None, normal Incapacitation due to abnormal movements: None, normal Patient's awareness of abnormal movements (rate only patient's report): No Awareness, Dental Status Current problems with teeth and/or dentures?: No Does patient usually wear dentures?: No   Review of Systems:  Neurological: The patient denies any headaches today. She denies any seizures or dizziness.  G.I.: The patient denies any constipation or G.I. Upset today.  Musculoskeletal: The patient denies any musculoskeletal issues today.  GU: Urinary Incontinence reported.   Time was spent today discussing with the patient her current symptoms. The patient states that she had difficulty initiating and maintaining sleep last night but reports a good appetite.  She denies any episodes of urinary incontinence. She reports severe feelings of sadness, anhedonia and depressed mood but denies any anxiety symptoms today. She denies any suicidal or  homicidal ideations today as well as any visual hallucinations or delusional thinking. She does report auditory hallucinations today which are continuing unchanged.  She denies any medication related side effects today or other concerns.   Treatment Plan Summary:  1. Daily contact with patient to assess and evaluate symptoms and progress in treatment.  2. Medication management  3. The patient will deny suicidal ideations or homicidal ideations for 48 hours prior to discharge and have a depression and anxiety rating of 3 or less. The patient will also deny any auditory or visual hallucinations or delusional thinking.  4. The patient will deny any symptoms of substance withdrawal at time of discharge.   Plan:  1. Will continue the patient on her current non-psychiatric medications as listed above.  2. Will continue the medication Zoloft at 100 mgs po q am for depression.  3. Will continue the medication Ditropan XL at 5 mgs po qhs for urinary incontinence.  4. Will continue the medication Prolixin Decanoate 25 mgs IM q 2 weeks with this last given May 15, 2012 for auditory hallucinations.  5. Will continue the medication Prolixin 10 mgs po qhs for auditory hallucinations.  6. Will continue the medication Artane at 1 mg po qhs for EPS.  7. Will increase the medication Trazodone to 200 mgs po qhs for sleep.  8. Will continue the medication Neurontin at 300 mgs po q am, 2 pm and hs for anxiety and pain.  9. Laboratory studies reviewed.  10. Will continue to monitor.   Anesha Hackert 05/17/2012, 1:00 PM

## 2012-05-17 NOTE — Progress Notes (Signed)
Patient states that she is still hearing "demon voices."  Patient reports that MD will make changes to her medication in hopes that the voices will cease.  She told staff that they are "scary."  Patient is pleasant; she is cooperative.  Her behavior is appropriate.  She has been attending groups with minimal participation.  She still appears desheveled and she has body odor.  Medication was started for her c/o urinary urgency.  She denies any SI/HI at this time.    Continue to monitor medication management and MD orders.  Maintain 15 min safety checks on patient.  Patient is receptive to staff.  Continue to reassure and support patient.

## 2012-05-17 NOTE — Progress Notes (Signed)
[facility] Group Notes:  (Counselor/Nursing/MHT/Case Management/Adjunct)  717/2013 2:00 PM  Type of Therapy:  [employer] Support  Participation Level:  Minimal  Participation Quality:  Attentive   Affect:  Blunted  Cognitive:  Alert and Oriented  Insight:  Poor  Engagement in Group:  Limited  Engagement in Therapy:  Limited  Modes of Intervention:  Clarification, Education, Socialization and Support  Summary of Progress/Problems:  Pt participated in this psycho/educational session by listening attentively. Therapist introduced the representative from the [employer].  He explained to the group that he was here to tell his story and inform them of the free services available at the [employer].  The speaker told his story and explained the progression of his Schizoaffective Disorder and Substance Abuse as well as the course of his recovery.  He entertained questions and handed out information cards.  Pt expressed interest in the services.  Therapist encouraged patients to take advantage of these free services that would be an excellent addition to their support system.     Marni Griffon C 05/17/2012, 2:00 PM

## 2012-05-17 NOTE — Progress Notes (Signed)
Patient awoke this AM stating she could hear the voices again.  Patient states she cannot make the voices out but they were loud.  Patient instructed to come to staff if voices start to command. Patient denies SI/HI ans verbally contracts for safety.

## 2012-05-18 LAB — GLUCOSE, CAPILLARY: Glucose-Capillary: 125 mg/dL — ABNORMAL HIGH (ref 70–99)

## 2012-05-18 MED ORDER — NICOTINE 21 MG/24HR TD PT24
21.0000 mg | MEDICATED_PATCH | Freq: Every day | TRANSDERMAL | Status: DC
  Administered 2012-05-19 – 2012-05-22 (×4): 21 mg via TRANSDERMAL
  Filled 2012-05-18 (×7): qty 1

## 2012-05-18 MED ORDER — OXYBUTYNIN CHLORIDE ER 10 MG PO TB24
10.0000 mg | ORAL_TABLET | Freq: Every day | ORAL | Status: DC
  Administered 2012-05-18 – 2012-05-21 (×4): 10 mg via ORAL
  Filled 2012-05-18 (×2): qty 1
  Filled 2012-05-18 (×2): qty 3
  Filled 2012-05-18: qty 1
  Filled 2012-05-18 (×2): qty 2
  Filled 2012-05-18 (×2): qty 1

## 2012-05-18 MED ORDER — SERTRALINE HCL 50 MG PO TABS
150.0000 mg | ORAL_TABLET | Freq: Every day | ORAL | Status: DC
  Administered 2012-05-19: 150 mg via ORAL
  Filled 2012-05-18 (×3): qty 1

## 2012-05-18 MED ORDER — FLUPHENAZINE HCL 5 MG PO TABS
15.0000 mg | ORAL_TABLET | Freq: Every day | ORAL | Status: DC
  Administered 2012-05-18 – 2012-05-21 (×4): 15 mg via ORAL
  Filled 2012-05-18 (×4): qty 1
  Filled 2012-05-18: qty 3
  Filled 2012-05-18: qty 1

## 2012-05-18 NOTE — Discharge Planning (Signed)
Met with patient in Treatment Team, as she did not attend group again today.  Patient is always pleasant on approach, and even when she wants to insist on discharge, remains pleasant.  Case Manager let her know that had spoken to her sister Aurea Graff, the ACT Team, and a lady Britta Mccreedy from [place].  She knew all these people and was glad to hear Case Manager was in touch with them.  See Treatment Plan update re things discussed re symptoms today.  Case Manager also discussed with Treatment Team about what, if anything, to put in writing for patient's sister.  It was decided that patient's sister can always get patient to ask for her records, but that we do not want to be placed in the middle of a situation where the family may be unhappy with a service provider and we provide information, as it is strictly by word of mouth that we have learned these things.  Utilization review done for additional days.  Ambrose Mantle, LCSW 05/18/2012, 3:02 PM

## 2012-05-18 NOTE — Progress Notes (Signed)
[facility] Group Notes:  (Counselor/Nursing/MHT/Case Management/Adjunct)  05/18/2012  2:15 PM  Type of Therapy: Group Therapy   Participation Level: Minimal   Participation Quality: Minimal  Affect: Blunted   Cognitive: oriented, alert   Insight: minimal  Engagement in Group: Limited  Modes of Intervention: Clarification, Education, Problem-solving, Socialization, Activity, Encouragement and Support   Summary of Progress/Problems: Pt actively participated in group by listening attentively and self disclosing.  Therapist addressed the topic of Creativity and Humor.  Therapist asked patients to verbalize a time in their lives that they were very happy.  Therapist encouraged patients to remember happy times when they are feeling stressed.  Pt stated she was very happy when she was with her boyfriend.  Therapist emphasized that maintaining a good sense of humor and laughter are important to maintaining a balanced lifestyle. Therapist praised patients for being creative and asked them to tell as story as evidence of this.  Therapist began a story and each patient added to the story for several rounds.     Therapist offered support and encouragement.  Some progress noted.  Intervention effective.         Marni Griffon C 05/18/2012, 2:15 PM

## 2012-05-18 NOTE — Progress Notes (Signed)
D: Patient appears and voices depression tonight.  Patient in room during assessment.  Patient states she has been tired all day.  Patient does express that she does not want to go down to [employer] because she is so "sleepy."  Patient states that she had a good day but she slept most of it.  Patient denies SI/HI but is still positive for auditory hallucinations.  Patient states, "The voices say mean things to me."  Also the voices say "I hate her."  Patient does verbally contract for safety.  Patient does tell RN that she is ready to go home as soon as these voices stop she will return home to her boyfriend who misses her.   A:  Support and encouragement offered by RN.  Patient encouraged to go to [facility].   Scheduled medication administered per MD orders. Staff to monitor Q 15 mins for safety R: Patient remains safe on the unit.  Patient did not attend karaoke tonight.  Patient taking scheduled medications per orders. Patient pleasant and cooperative.

## 2012-05-18 NOTE — Progress Notes (Signed)
D: Pt resitng in bed with eyes closed but she does startle when RN walks in the room.  No conversation exchanged.  Pt respirations even and unlabored and no apparent distress noted lightly snoring. A: Staff to monitor Q 15 mins for safety R: Pt remains safe on the unit at this time.

## 2012-05-18 NOTE — Progress Notes (Signed)
Psychoeducational Group Note  Date:  05/18/2012 Time:  1100  Group Topic/Focus:  Diagnosis Education:   The focus of this group is to discuss the major disorders that patients maybe diagnosed with.  Group discusses the importance of knowing what one's diagnosis is so that one can understand treatment and better advocate for oneself. Self Esteem Action Plan:   The focus of this group is to help patients create a plan to continue to build self-esteem after discharge.  Participation Level:  Did Not Attend  Participation Quality:    Affect:    Cognitive:    Insight:    Engagement in Group:    Additional Comments:  Pt was to tired, pt couldn't get up out of bed.  Isla Pence M 05/18/2012, 2:15 PM

## 2012-05-18 NOTE — Progress Notes (Signed)
Patient continues to c/o [facility]; stating that the "demons" are still talking to her.  She remains pleasant and cooperative.  Her hygiene needs improving as patient has urinary urgency which causes her to be incontinent in the bed at times.    She has been sleepy and lethargic today.  Discussed with MD regarding her trazadone dose as this was increased yesterday and pt. Has difficulty awakening.  She attended treatment team meeting and her discharge was discussed.  Patient will be staying until further stabilization.  She denies any SI/HI.  She rates her depression as an 8.  Continue to monitor medication management and MD orders.  Patient monitored q 15 minutes for safety.  Patient is interacting well with staff and others; her behavior is appropriate.

## 2012-05-18 NOTE — Tx Team (Signed)
Interdisciplinary Treatment Plan Update (Adult)  Date:  05/18/2012  Time Reviewed:  10:15AM-11:15AM  Progress in Treatment: Attending groups:  Not much, sleeping Participating in groups:    Mostly sleeping Taking medication as prescribed:    Yes Tolerating medication:   Yes Family/Significant other contact made:  Yes Patient understands diagnosis:   Yes Discussing patient identified problems/goals with staff:   Yes, focused on going home Medical problems stabilized or resolved:   Yes, stable Denies suicidal/homicidal ideation:  Yes Issues/concerns per patient self-inventory:   None Other:    New problem(s) identified: No, Describe:    Reason for Continuation of Hospitalization: Anxiety Delusions  Depression Hallucinations Medication stabilization Other; describe lack of sleep  Interventions implemented related to continuation of hospitalization:  Medication monitoring and adjustment, safety checks Q15 min., suicide risk assessment, group therapy, psychoeducation, collateral contact, aftercare planning, ongoing physician assessments, medication education  Additional comments:  Not applicable  Estimated length of stay:  2-4 days  Discharge Plan:   Return to her apartment with boyfriend, follow up with [facility], get evaluated for home health care services.  New goal(s):  5.  Reduce anxiety to no greater than 3 at discharge.  (Today is "7".)  Review of initial/current patient goals per problem list:   1.  Goal(s):  Eliminate SI  Met:  Yes  Target date:  By Discharge   As evidenced by:  Denies  2.  Goal(s):  Eliminate psychosis (auditory hallucinations and delusions)  Met:  No  Target date:  By Discharge   As evidenced by:  At first states the voices are gone, but then admits she is lying, and that the voices are present and are very strong.  States "I believe those demons go up in my body and eat my medicine."  3.  Goal(s):  Determine safe plan  for discharge  Met:  No  Target date:  By Discharge   As evidenced by:  Case Manager has spoken with Local Management Entity which wants patient to continue trying out independence and not to be returned to the assisted living facility yet.  4.  Goal(s):  Decrease depression to no greater than 3 at discharge  Met:  No  Target date:  By Discharge   As evidenced by:  "8" today  Attendees: Patient:  Sherri Hays  05/18/2012 10:15AM-11:15AM  Family:     Physician:  Dr. Harvie Heck Readling 05/18/2012 10:15AM-11:15AM  Nursing:   Nestor Ramp, RN 05/18/2012 10:15AM -11:15AM   Case Manager:  Ambrose Mantle, LCSW 05/18/2012 10:15AM-11:15AM  Counselor:  Marni Griffon, LCAS 05/18/2012 10:15AM-11:15AM  Other:      Other:      Other:      Other:       Scribe for Treatment Team:   Sarina Ser, 05/18/2012, 10:15AM-11:15AM

## 2012-05-18 NOTE — Progress Notes (Signed)
[facility] MD Progress Note  05/18/2012 4:29 PM  Diagnosis:  Axis I: Schizoaffective Disorder - Depressed Type.   The patient was seen today and reports the following:   ADL's: Intact.  Sleep: The patient reports to having some difficulty initiating sleep last night.  Appetite: The patient reports that her appetite is good today.   Mild>(1-10) >Severe  Hopelessness (1-10): 2  Depression (1-10): 8  Anxiety (1-10): 8   Suicidal Ideation: The patient denies any suicidal ideations today.  Plan: No  Intent: No  Means: No   Homicidal Ideation: The patient denies any homicidal ideations today.  Plan: No  Intent: No.  Means: No   General Appearance/Behavior: The patient remains friendly and cooperative today with this provider.  Eye Contact: Good.  Speech:  Appropriate in rate and volume today with no pressuring noted.  Motor Behavior: wnl.  Level of Consciousness: Alert and Oriented x 3.  Mental Status: Alert and Oriented x 3.  Mood: Appears severely depressed.  Affect: Moderately constricted.  Anxiety Level: Severe anxiety reported today.  Thought Process: Auditory hallucinations reported.  Thought Content: The patient denies any visual hallucinations or any delusional thinking today. She reports the continuation of her voices.  Perception: Auditory hallucinations reported.  Judgment: Fair.  Insight: Fair.  Cognition: Oriented to person, place and time.  Sleep:  Number of Hours: 6.5    Vital Signs:Blood pressure 121/57, pulse 67, temperature 98.4 F (36.9 C), temperature source Oral, resp. rate 20, height 5\' 2"  (1.575 m), weight 90.719 kg (200 lb).  Current Medications: Current Facility-Administered Medications  Medication Dose Route Frequency Provider Last Rate Last Dose  . acetaminophen (TYLENOL) tablet 650 mg  650 mg Oral Q6H PRN Nehemiah Settle, MD      . albuterol (PROVENTIL HFA;VENTOLIN HFA) 108 (90 BASE) MCG/ACT inhaler 2 puff  2 puff Inhalation Q6H PRN  Nehemiah Settle, MD   2 puff at 05/14/12 2031  . alum & mag hydroxide-simeth (MAALOX/MYLANTA) 200-200-20 MG/5ML suspension 30 mL  30 mL Oral Q4H PRN Nehemiah Settle, MD   30 mL at 05/15/12 0945  . amLODipine (NORVASC) tablet 10 mg  10 mg Oral QHS Nehemiah Settle, MD   10 mg at 05/17/12 2149  . atorvastatin (LIPITOR) tablet 10 mg  10 mg Oral QHS Nehemiah Settle, MD   10 mg at 05/17/12 2149  . diclofenac (VOLTAREN) EC tablet 75 mg  75 mg Oral BID Nehemiah Settle, MD   75 mg at 05/18/12 1204  . fluPHENAZine (PROLIXIN) tablet 10 mg  10 mg Oral QHS Nehemiah Settle, MD   10 mg at 05/17/12 2150  . fluPHENAZine decanoate (PROLIXIN) injection 25 mg  25 mg Intramuscular Q14 Days Curlene Labrum Raguel Kosloski, MD   25 mg at 05/15/12 1619  . Fluticasone-Salmeterol (ADVAIR) 250-50 MCG/DOSE inhaler 1 puff  1 puff Inhalation BID Nehemiah Settle, MD   1 puff at 05/17/12 0810  . gabapentin (NEURONTIN) capsule 300 mg  300 mg Oral [id number] Curlene Labrum Halcyon Heck, MD   300 mg at 05/18/12 1328  . hydrALAZINE (APRESOLINE) tablet 50 mg  50 mg Oral BH-[id number] Curlene Labrum Carlous Olivares, MD   50 mg at 05/18/12 1328  . hydrOXYzine (ATARAX/VISTARIL) tablet 50 mg  50 mg Oral QHS PRN,MR X 1 Nehemiah Settle, MD   50 mg at 05/16/12 0138  . insulin glargine (LANTUS) injection 30 Units  30 Units Subcutaneous QHS Nehemiah Settle, MD   30 Units at 05/17/12 2150  .  magnesium hydroxide (MILK OF MAGNESIA) suspension 30 mL  30 mL Oral Daily PRN Nehemiah Settle, MD      . metFORMIN (GLUCOPHAGE) tablet 500 mg  500 mg Oral BID WC Nehemiah Settle, MD   500 mg at 05/18/12 1203  . nicotine polacrilex (NICORETTE) gum 2 mg  2 mg Oral PRN Curlene Labrum Kynzie Polgar, MD      . oxybutynin (DITROPAN-XL) 24 hr tablet 5 mg  5 mg Oral QHS Curlene Labrum Lakely Elmendorf, MD   5 mg at 05/17/12 2148  . pantoprazole (PROTONIX) EC tablet 40 mg  40 mg Oral Q2200 Curlene Labrum Breck Maryland, MD   40 mg at  05/17/12 2149  . sertraline (ZOLOFT) tablet 100 mg  100 mg Oral Daily Curlene Labrum Elina Streng, MD   100 mg at 05/18/12 1211  . traZODone (DESYREL) tablet 200 mg  200 mg Oral QHS Curlene Labrum Decklin Weddington, MD   200 mg at 05/17/12 2150  . trihexyphenidyl (ARTANE) tablet 1 mg  1 mg Oral QHS Nehemiah Settle, MD   1 mg at 05/17/12 2200   Lab Results:  Results for orders placed during the hospital encounter of 05/12/12 (from the past 48 hour(s))  GLUCOSE, CAPILLARY     Status: Normal   Collection Time   05/16/12  5:06 PM      Component Value Range Comment   Glucose-Capillary 83  70 - 99 mg/dL    Comment 1 Notify RN     GLUCOSE, CAPILLARY     Status: Abnormal   Collection Time   05/16/12  8:37 PM      Component Value Range Comment   Glucose-Capillary 170 (*) 70 - 99 mg/dL    Comment 1 Notify RN      Comment 2 Documented in Chart     GLUCOSE, CAPILLARY     Status: Abnormal   Collection Time   05/17/12  5:57 AM      Component Value Range Comment   Glucose-Capillary 118 (*) 70 - 99 mg/dL    Comment 1 Notify RN     GLUCOSE, CAPILLARY     Status: Abnormal   Collection Time   05/17/12 12:11 PM      Component Value Range Comment   Glucose-Capillary 222 (*) 70 - 99 mg/dL   GLUCOSE, CAPILLARY     Status: Abnormal   Collection Time   05/17/12  9:00 PM      Component Value Range Comment   Glucose-Capillary 200 (*) 70 - 99 mg/dL   GLUCOSE, CAPILLARY     Status: Normal   Collection Time   05/18/12  6:29 AM      Component Value Range Comment   Glucose-Capillary 96  70 - 99 mg/dL    Comment 1 Notify RN     GLUCOSE, CAPILLARY     Status: Normal   Collection Time   05/18/12 11:57 AM      Component Value Range Comment   Glucose-Capillary 90  70 - 99 mg/dL    Physical Findings: AIMS: Facial and Oral Movements Muscles of Facial Expression: None, normal Lips and Perioral Area: None, normal Jaw: None, normal Tongue: None, normal,Extremity Movements Upper (arms, wrists, hands, fingers): None,  normal Lower (legs, knees, ankles, toes): None, normal, Trunk Movements Neck, shoulders, hips: None, normal, Overall Severity Severity of abnormal movements (highest score from questions above): None, normal Incapacitation due to abnormal movements: None, normal Patient's awareness of abnormal movements (rate only patient's report): No Awareness, Dental Status Current  problems with teeth and/or dentures?: No Does patient usually wear dentures?: No   Review of Systems:  Neurological: The patient denies any headaches today. She denies any seizures or dizziness.  G.I.: The patient denies any constipation or G.I. Upset today.  Musculoskeletal: The patient denies any musculoskeletal issues today.  GU: Urinary Incontinence reported.   Time was spent today discussing with the patient her current symptoms. The patient states that she had difficulty initiating sleep last night but reports a good appetite. She reports other episodes of urinary incontinence. She reports severe feelings of sadness, anhedonia and depressed mood and also reports severe anxiety symptoms today. She denies any suicidal or homicidal ideations today as well as any visual hallucinations or delusional thinking. She does report auditory hallucinations today which are continuing unchanged. She denies any medication related side effects today or other concerns.   Treatment Plan Summary:  1. Daily contact with patient to assess and evaluate symptoms and progress in treatment.  2. Medication management  3. The patient will deny suicidal ideations or homicidal ideations for 48 hours prior to discharge and have a depression and anxiety rating of 3 or less. The patient will also deny any auditory or visual hallucinations or delusional thinking.  4. The patient will deny any symptoms of substance withdrawal at time of discharge.   Plan:  1. Will continue the patient on her current non-psychiatric medications as listed above.  2. Will  continue the medication Zoloft at the increased dosage of 150 mgs po q am for depression.  3. Will increase the medication Ditropan XL at the increased dosage of 10 mgs po qhs for urinary incontinence.  4. Will continue the medication Prolixin Decanoate 25 mgs IM q 2 weeks with this last given May 15, 2012 for auditory hallucinations.  5. Will increase the medication Prolixin to 15 mgs po qhs to further address her auditory hallucinations.  6. Will continue the medication Artane at 1 mg po qhs for EPS.  7. Will increase the medication Trazodone to 200 mgs po qhs for sleep.  8. Will continue the medication Neurontin at 300 mgs po q am, 2 pm and hs for anxiety and pain.  9. Laboratory studies reviewed.  10. Will continue to monitor.    Sherri Hays 05/18/2012, 4:29 PM

## 2012-05-18 NOTE — Progress Notes (Signed)
[facility] Group Notes:  (Counselor/Nursing/MHT/Case Management/Adjunct)  05/18/2012  10:30  AM  Type of Therapy: Group Therapy   Participation Level:  Did Not Attend. Pt was asleep.     Marni Griffon C 05/18/2012  10:30 AM

## 2012-05-18 NOTE — Progress Notes (Signed)
Patient difficult to arouse this AM.  Patient in no apparent distress respirations even and unlabored and patient snoring. Patient VS taken and documented.  Patient will wake up for a few seconds and answer questions and follows commands. Will continue to monitor.

## 2012-05-19 LAB — GLUCOSE, CAPILLARY
Glucose-Capillary: 172 mg/dL — ABNORMAL HIGH (ref 70–99)
Glucose-Capillary: 68 mg/dL — ABNORMAL LOW (ref 70–99)
Glucose-Capillary: 171 mg/dL — ABNORMAL HIGH (ref 70–99)
Glucose-Capillary: 197 mg/dL — ABNORMAL HIGH (ref 70–99)

## 2012-05-19 MED ORDER — SERTRALINE HCL 100 MG PO TABS
200.0000 mg | ORAL_TABLET | Freq: Every day | ORAL | Status: DC
  Administered 2012-05-20 – 2012-05-22 (×3): 200 mg via ORAL
  Filled 2012-05-19 (×3): qty 2
  Filled 2012-05-19: qty 6
  Filled 2012-05-19: qty 2
  Filled 2012-05-19: qty 6

## 2012-05-19 NOTE — Discharge Planning (Signed)
Met with patient in Aftercare Planning Group and provided today's workbook based on theme of the day.  She was more alert than Case Manager has previously seen.  She again requested to be discharged, stating that her boyfriend called last night and misses her.  Reassured her she would continue to feel better daily, and will be discharged as soon as she can return to her apartment and be independent.    Spoke with [facility], and they will see her on the day of discharge, even if late in the afternoon.  Patient stated that her demons left a couple of hours after her doctor left yesterday.  Ambrose Mantle, LCSW 05/19/2012, 1:35 PM

## 2012-05-19 NOTE — Progress Notes (Signed)
05/19/2012         Time: 0930       Group Topic/Focus: The focus of the group is on enhancing the patients' ability to cope with stressors by understanding what coping is, why it is important, the negative effects of stress and developing healthier coping skills. Patients practice [place] and discuss how exercise can be used as a healthy coping strategy.  Participation Level: Active  Participation Quality: Redirectable  Affect: Excited  Cognitive: Oriented   Additional Comments: Patient bright, participates with prompting. Patient reports she hasn't heard any voices since seeing her doctor yesterday.  [name] 05/19/2012 11:55 AM

## 2012-05-19 NOTE — Progress Notes (Signed)
Psychoeducational Group Note  Date:  05/19/2012 Time:  1100  Group Topic/Focus:  Relapse Prevention Planning:   The focus of this group is to define relapse and discuss the need for planning to combat relapse.  Participation Level:  Active  Participation Quality:  Appropriate and Supportive  Affect:  Appropriate  Cognitive:  Appropriate  Insight:  Good  Engagement in Group:  Good  Additional Comments:  Pt shared but was all over the place and was off topic.  Isla Pence M 05/19/2012, 1:37 PM

## 2012-05-19 NOTE — Progress Notes (Signed)
Patient ID: Sherri Hays, female   DOB: 02-14-1951, 61 y.o.   MRN: [id number] [facility] MD Progress Note  05/19/2012 1:02 PM  Diagnosis:  Axis I: Schizoaffective Disorder - Depressed Type.   The patient was seen today and reports the following:   ADL's: Intact.  Sleep: The patient reports to having some difficulty initiating sleep last night.  Appetite: The patient reports that her appetite is good today.   Mild>(1-10) >Severe  Hopelessness (1-10): 2  Depression (1-10): 8  Anxiety (1-10): 8   Suicidal Ideation: The patient denies any suicidal ideations today.  Plan: No  Intent: No  Means: No   Homicidal Ideation: The patient denies any homicidal ideations today.  Plan: No  Intent: No.  Means: No   General Appearance/Behavior: The patient remains friendly and cooperative today with this provider.  Eye Contact: Good.  Speech:  Appropriate in volume rate and tone, spontaneous Motor Behavior: wnl.  Level of Consciousness: Alert and Oriented x 3.  Mental Status: Alert and Oriented x 3.  Mood: Appears severely depressed.  Affect: Moderately constricted.  Anxiety Level: Severe anxiety reported today.  Thought Process: Auditory hallucinations reported.  Thought Content: No delusions  Perception: Auditory hallucinations reported.  Judgment: Fair.  Insight: Fair.  Cognition: Oriented to person, place and time.  Sleep:  Number of Hours: 4    Vital Signs:Blood pressure 145/83, pulse 98, temperature 97.1 F (36.2 C), temperature source Oral, resp. rate 18, height 5\' 2"  (1.575 m), weight 200 lb (90.719 kg).  Current Medications: Current Facility-Administered Medications  Medication Dose Route Frequency Provider Last Rate Last Dose  . acetaminophen (TYLENOL) tablet 650 mg  650 mg Oral Q6H PRN Nehemiah Settle, MD      . albuterol (PROVENTIL HFA;VENTOLIN HFA) 108 (90 BASE) MCG/ACT inhaler 2 puff  2 puff Inhalation Q6H PRN Nehemiah Settle, MD   2 puff at 05/14/12 2031    . alum & mag hydroxide-simeth (MAALOX/MYLANTA) 200-200-20 MG/5ML suspension 30 mL  30 mL Oral Q4H PRN Nehemiah Settle, MD   30 mL at 05/15/12 0945  . amLODipine (NORVASC) tablet 10 mg  10 mg Oral QHS Nehemiah Settle, MD   10 mg at 05/18/12 2215  . atorvastatin (LIPITOR) tablet 10 mg  10 mg Oral QHS Nehemiah Settle, MD   10 mg at 05/18/12 2215  . diclofenac (VOLTAREN) EC tablet 75 mg  75 mg Oral BID Nehemiah Settle, MD   75 mg at 05/19/12 0825  . fluPHENAZine (PROLIXIN) tablet 15 mg  15 mg Oral QHS Curlene Labrum Readling, MD   15 mg at 05/18/12 2214  . fluPHENAZine decanoate (PROLIXIN) injection 25 mg  25 mg Intramuscular Q14 Days Curlene Labrum Readling, MD   25 mg at 05/15/12 1619  . Fluticasone-Salmeterol (ADVAIR) 250-50 MCG/DOSE inhaler 1 puff  1 puff Inhalation BID Nehemiah Settle, MD   1 puff at 05/19/12 0826  . gabapentin (NEURONTIN) capsule 300 mg  300 mg Oral [id number] Curlene Labrum Readling, MD   300 mg at 05/19/12 0829  . hydrALAZINE (APRESOLINE) tablet 50 mg  50 mg Oral [id number] Curlene Labrum Readling, MD   50 mg at 05/19/12 0825  . hydrOXYzine (ATARAX/VISTARIL) tablet 50 mg  50 mg Oral QHS PRN,MR X 1 Nehemiah Settle, MD   50 mg at 05/16/12 0138  . insulin glargine (LANTUS) injection 30 Units  30 Units Subcutaneous QHS Nehemiah Settle, MD   30 Units at 05/18/12 2215  . magnesium  hydroxide (MILK OF MAGNESIA) suspension 30 mL  30 mL Oral Daily PRN Nehemiah Settle, MD      . metFORMIN (GLUCOPHAGE) tablet 500 mg  500 mg Oral BID WC Nehemiah Settle, MD   500 mg at 05/19/12 0825  . nicotine (NICODERM CQ - dosed in mg/24 hours) patch 21 mg  21 mg Transdermal Daily Curlene Labrum Readling, MD   21 mg at 05/19/12 0826  . oxybutynin (DITROPAN-XL) 24 hr tablet 10 mg  10 mg Oral QHS Curlene Labrum Readling, MD   10 mg at 05/18/12 2219  . pantoprazole (PROTONIX) EC tablet 40 mg  40 mg Oral Q2200 Curlene Labrum Readling, MD   40 mg at 05/18/12 2215  .  sertraline (ZOLOFT) tablet 150 mg  150 mg Oral Daily Curlene Labrum Readling, MD   150 mg at 05/19/12 0825  . traZODone (DESYREL) tablet 200 mg  200 mg Oral QHS Curlene Labrum Readling, MD   200 mg at 05/18/12 2215  . trihexyphenidyl (ARTANE) tablet 1 mg  1 mg Oral QHS Nehemiah Settle, MD   1 mg at 05/18/12 2215  . DISCONTD: fluPHENAZine (PROLIXIN) tablet 10 mg  10 mg Oral QHS Nehemiah Settle, MD   10 mg at 05/17/12 2150  . DISCONTD: nicotine polacrilex (NICORETTE) gum 2 mg  2 mg Oral PRN Ronny Bacon, MD      . DISCONTD: oxybutynin (DITROPAN-XL) 24 hr tablet 5 mg  5 mg Oral QHS Curlene Labrum Readling, MD   5 mg at 05/17/12 2148  . DISCONTD: sertraline (ZOLOFT) tablet 100 mg  100 mg Oral Daily Curlene Labrum Readling, MD   100 mg at 05/18/12 1211   Lab Results:  Results for orders placed during the hospital encounter of 05/12/12 (from the past 48 hour(s))  GLUCOSE, CAPILLARY     Status: Abnormal   Collection Time   05/17/12  5:25 PM      Component Value Range Comment   Glucose-Capillary 172 (*) 70 - 99 mg/dL    Comment 1 Notify RN     GLUCOSE, CAPILLARY     Status: Abnormal   Collection Time   05/17/12  9:00 PM      Component Value Range Comment   Glucose-Capillary 200 (*) 70 - 99 mg/dL   GLUCOSE, CAPILLARY     Status: Normal   Collection Time   05/18/12  6:29 AM      Component Value Range Comment   Glucose-Capillary 96  70 - 99 mg/dL    Comment 1 Notify RN     GLUCOSE, CAPILLARY     Status: Normal   Collection Time   05/18/12 11:57 AM      Component Value Range Comment   Glucose-Capillary 90  70 - 99 mg/dL   GLUCOSE, CAPILLARY     Status: Abnormal   Collection Time   05/18/12  5:24 PM      Component Value Range Comment   Glucose-Capillary 122 (*) 70 - 99 mg/dL    Comment 1 Notify RN     GLUCOSE, CAPILLARY     Status: Abnormal   Collection Time   05/18/12  9:54 PM      Component Value Range Comment   Glucose-Capillary 125 (*) 70 - 99 mg/dL   GLUCOSE, CAPILLARY     Status: Abnormal     Collection Time   05/19/12  6:11 AM      Component Value Range Comment   Glucose-Capillary 68 (*) 70 -  99 mg/dL   GLUCOSE, CAPILLARY     Status: Abnormal   Collection Time   05/19/12  6:45 AM      Component Value Range Comment   Glucose-Capillary 118 (*) 70 - 99 mg/dL   GLUCOSE, CAPILLARY     Status: Abnormal   Collection Time   05/19/12 12:01 PM      Component Value Range Comment   Glucose-Capillary 174 (*) 70 - 99 mg/dL    Comment 1 Notify RN      Physical Findings: AIMS: Facial and Oral Movements Muscles of Facial Expression: None, normal Lips and Perioral Area: None, normal Jaw: None, normal Tongue: None, normal,Extremity Movements Upper (arms, wrists, hands, fingers): None, normal Lower (legs, knees, ankles, toes): None, normal, Trunk Movements Neck, shoulders, hips: None, normal, Overall Severity Severity of abnormal movements (highest score from questions above): None, normal Incapacitation due to abnormal movements: None, normal Patient's awareness of abnormal movements (rate only patient's report): No Awareness, Dental Status Current problems with teeth and/or dentures?: No Does patient usually wear dentures?: No   Review of Systems:  Neurological: The patient denies any headaches today. She denies any seizures or dizziness.  G.I.: The patient denies any constipation or G.I. Upset today.  Musculoskeletal: The patient denies any musculoskeletal issues today.  GU: Urinary Incontinence reported.  Patient's glucose levels continued to be high  Patient reports  feelings of sadness, anhedonia and depressed mood . Her anxiety has decreased since yesterday. She denies any suicidal or homicidal ideations today  She does report auditory hallucinations today which are continuing but have decreased in intensity. She denies any medication related side effects today or any other concerns.   Treatment Plan Summary:  1. Daily contact with patient to assess and evaluate symptoms and  progress in treatment.  2. Medication management  3. The patient will deny suicidal ideations or homicidal ideations for 48 hours prior to discharge and have a depression and anxiety rating of 3 or less. The patient will also deny any auditory or visual hallucinations or delusional thinking.   Plan:  1. Will continue the patient on her current non-psychiatric medications as listed above.  2. Will increase the medication Zoloft to 200 mg to help with the depression  3. Will continue the medication Ditropan XL at  10 mgs po qhs for urinary incontinence.  4. Will continue the medication Prolixin Decanoate 25 mgs IM q 2 weeks with this last given May 15, 2012 for auditory hallucinations.  5. Will continue the medication Prolixin to 15 mgs po qhs for  her auditory hallucinations.  6. Will continue the medication Artane at 1 mg po qhs for EPS.  7. Will continue the medication Trazodone 200 mgs po qhs for sleep.  8. Will continue the medication Neurontin at 300 mgs po q am, 2 pm and hs for anxiety and pain.  9. Laboratory studies reviewed.     Taiylor Virden 05/19/2012, 1:02 PM

## 2012-05-19 NOTE — Progress Notes (Signed)
Patient CBG at 0611 this AM was 68.  Patient is hard to arouse in the mornings but patient states she does not like to get up in the morning.  Patient was given pudding and applesauce and CBG was rechecked at 0645 and CBG was 118.  Patient got up and went to breakfast this AM.

## 2012-05-19 NOTE — Progress Notes (Addendum)
D: Pt denies SI/HI but is still having auditory hallucinations; pt says the voices are lower but that they are still saying "mean things" to her; pt has a depressed mood and appropriate affect; pt rates her depression and hopelessness a 0 out of 10 (1 low/10 high): pt reports sleeping poorly even though staff reports pt going to bed early and oversleeping during the day;  pt is cooperative and attends groups and activities on the unit A: Pt given emotional support from RN; pt encouraged to come to staff members with questions and concerns; pt's medication routine continued; pt's plan of care reviewed R: Pt remains appropriate and cooperative; will continue to monitor pt for safety

## 2012-05-19 NOTE — Progress Notes (Signed)
Patient ID: Sherri Hays, female   DOB: 1951/03/22, 61 y.o.   MRN: [id number] D. The patient is bright, pleasant and social interacting in the milieu. Her thoughts are somewhat tangential. Disheveled in appearance. States a demon is causing her to urinate. Incontinent of urine. A. Q 15 minute checks maintained for safety. Encouraged to attend group. Adult diaper offered and assisted with putting it on. Medicated with her HS medications. CBG and VS taken. R. The patient  attended and participated in group. Talked about wanting to be discharged because she missed her boyfriend. Continues to endorse auditory hallucinations of a demon telling her to urinated on herself. Diabetic diet education reviewed.

## 2012-05-19 NOTE — Progress Notes (Signed)
[facility] Group Notes:  (Counselor/Nursing/MHT/Case Management/Adjunct)  05/19/2012  2:30  PM  Type of Therapy: Group Therapy   Participation Level: Minimal   Participation Quality: Limited  Affect: Blunted, Depressed  Cognitive: oriented, alert   Insight: Poor  Engagement in Group: Limited  Modes of Intervention: Clarification, Education, Problem-solving, Socialization, Activity, Encouragement and Support   Summary of Progress/Problems: Pt participated in group by listening attentively and self disclosing.  After a brief check-in, therapist introduced the topic of Feelings Around Relapse.  Therapist guided patients to explore emotions they have related to recovery.  Therapist prompted patients to answer the following questions: Which emotions come before relapse?  Which emotions result after the relapse:  Which emotions are related to their recovery; and Ways to respond to other people's emotional reaction to their relapse and recovery.   Patient was minimally engaged in the process and was called out of group to meet with the Diabetic Team nurse for testing.         Marni Griffon C 05/19/2012  2:30 PM

## 2012-05-19 NOTE — Progress Notes (Addendum)
Received referral for this patient-  Referral stated "Patient is diabetic, sugars are running high".  Patient admitted 05/13/12.  History of schizoaffective disorder per psychiatry.  Also has documented history of Diabetes Type 2.  Home diabetes medication regimen includes: Lantus 30 units QHS + Metformin 500 mg bid.   Results for NECOLA, BLUESTEIN (MRN [id number]) as of 05/19/2012 13:42  Ref. Range 05/18/2012 06:29 05/18/2012 11:57 05/18/2012 17:24 05/18/2012 21:54  Glucose-Capillary Latest Range: 70-99 mg/dL 96 90 119 (H) 147 (H)    Results for HOBBSAsha, Grumbine (MRN [id number]) as of 05/19/2012 13:42  Ref. Range 05/19/2012 06:11 05/19/2012 06:45 05/19/2012 12:01  Glucose-Capillary Latest Range: 70-99 mg/dL 68 (L) 865 (H) 784 (H)   Patient slightly hypoglycemic this morning with CBG of 68 mg/dl.  Otherwise, her glucose levels are within hospital targets as of now.  Currently getting Lantus 30 units QHS + Metformin 500 mg bid.  May want to change patient's diet to Carbohydrate Modified Medium diet (currently ordered as Regular diet).  No other glycemic recommendations at this point.  Will continue to follow & assist with glycemic control as needed.  Ambrose Finland RN, MSN, CDE Diabetes Coordinator Inpatient Diabetes Program [phone]

## 2012-05-20 LAB — URINALYSIS, MICROSCOPIC ONLY
Glucose, UA: NEGATIVE mg/dL
Hgb urine dipstick: NEGATIVE
Ketones, ur: NEGATIVE mg/dL
Leukocytes, UA: NEGATIVE
pH: 5.5 (ref 5.0–8.0)

## 2012-05-20 LAB — GLUCOSE, CAPILLARY: Glucose-Capillary: 146 mg/dL — ABNORMAL HIGH (ref 70–99)

## 2012-05-20 MED ORDER — GLUCOSE 40 % PO GEL
1.0000 | ORAL | Status: DC | PRN
  Administered 2012-05-20: 37.5 g via ORAL

## 2012-05-20 NOTE — Progress Notes (Signed)
Patient ID: Sherri Hays, female   DOB: Jul 20, 1951, 61 y.o.   MRN: [id number] [facility] MD Progress Note  05/20/2012 6:10 PM  Diagnosis:  Axis I: Schizoaffective Disorder - Depressed Type.   The patient was seen today and reports the following:   ADL's: Intact.  Sleep: fair  Appetite: The patient reports that her appetite is good today.   Mild>(1-10) >Severe  Hopelessness (1-10): 2  Depression (1-10): 5 Anxiety (1-10): 6  Suicidal Ideation: The patient denies any suicidal ideations today.  Plan: No  Intent: No  Means: No   Homicidal Ideation: The patient denies any homicidal ideations today.  Plan: No  Intent: No.  Means: No   General Appearance/Behavior: The patient remains friendly and cooperative today with this provider.  Eye Contact: Good.  Speech:  Appropriate in volume rate and tone, spontaneous Motor Behavior: wnl.  Level of Consciousness: Alert and Oriented x 3.  Mental Status: Alert and Oriented x 3.  Mood: Appears severely depressed.  Affect: Moderately constricted.  Anxiety Level: Severe anxiety reported today.  Thought Process: no Auditory hallucinations reported.  Thought Content: No delusions  Perception: Auditory hallucinations reported.  Judgment: Fair.  Insight: Fair.  Cognition: Oriented to person, place and time.  Sleep:  Number of Hours: 6.75    Vital Signs:Blood pressure 117/89, pulse 110, temperature 97 F (36.1 C), temperature source Oral, resp. rate 15, height 5\' 2"  (1.575 m), weight 90.719 kg (200 lb).  Current Medications: Current Facility-Administered Medications  Medication Dose Route Frequency Provider Last Rate Last Dose  . acetaminophen (TYLENOL) tablet 650 mg  650 mg Oral Q6H PRN Nehemiah Settle, MD      . albuterol (PROVENTIL HFA;VENTOLIN HFA) 108 (90 BASE) MCG/ACT inhaler 2 puff  2 puff Inhalation Q6H PRN Nehemiah Settle, MD   2 puff at 05/20/12 0840  . alum & mag hydroxide-simeth (MAALOX/MYLANTA) 200-200-20 MG/5ML  suspension 30 mL  30 mL Oral Q4H PRN Nehemiah Settle, MD   30 mL at 05/15/12 0945  . amLODipine (NORVASC) tablet 10 mg  10 mg Oral QHS Nehemiah Settle, MD   10 mg at 05/19/12 2138  . atorvastatin (LIPITOR) tablet 10 mg  10 mg Oral QHS Nehemiah Settle, MD   10 mg at 05/19/12 2139  . dextrose (GLUTOSE) 40 % oral gel 37.5 g  1 Tube Oral PRN Curlene Labrum Readling, MD   37.5 g at 05/20/12 1140  . diclofenac (VOLTAREN) EC tablet 75 mg  75 mg Oral BID Nehemiah Settle, MD   75 mg at 05/20/12 1709  . fluPHENAZine (PROLIXIN) tablet 15 mg  15 mg Oral QHS Curlene Labrum Readling, MD   15 mg at 05/19/12 2139  . fluPHENAZine decanoate (PROLIXIN) injection 25 mg  25 mg Intramuscular Q14 Days Curlene Labrum Readling, MD   25 mg at 05/15/12 1619  . Fluticasone-Salmeterol (ADVAIR) 250-50 MCG/DOSE inhaler 1 puff  1 puff Inhalation BID Nehemiah Settle, MD   1 puff at 05/19/12 2033  . gabapentin (NEURONTIN) capsule 300 mg  300 mg Oral [id number] Curlene Labrum Readling, MD   300 mg at 05/20/12 1709  . hydrALAZINE (APRESOLINE) tablet 50 mg  50 mg Oral [id number] Curlene Labrum Readling, MD   50 mg at 05/20/12 1709  . hydrOXYzine (ATARAX/VISTARIL) tablet 50 mg  50 mg Oral QHS PRN,MR X 1 Nehemiah Settle, MD   50 mg at 05/16/12 0138  . insulin glargine (LANTUS) injection 30 Units  30 Units Subcutaneous QHS  Nehemiah Settle, MD   30 Units at 05/19/12 2149  . magnesium hydroxide (MILK OF MAGNESIA) suspension 30 mL  30 mL Oral Daily PRN Nehemiah Settle, MD      . metFORMIN (GLUCOPHAGE) tablet 500 mg  500 mg Oral BID WC Nehemiah Settle, MD   500 mg at 05/20/12 1710  . nicotine (NICODERM CQ - dosed in mg/24 hours) patch 21 mg  21 mg Transdermal Daily Curlene Labrum Readling, MD   21 mg at 05/20/12 0839  . oxybutynin (DITROPAN-XL) 24 hr tablet 10 mg  10 mg Oral QHS Curlene Labrum Readling, MD   10 mg at 05/19/12 2138  . pantoprazole (PROTONIX) EC tablet 40 mg  40 mg Oral Q2200 Curlene Labrum  Readling, MD   40 mg at 05/19/12 2139  . sertraline (ZOLOFT) tablet 200 mg  200 mg Oral Daily Nelly Rout, MD   200 mg at 05/20/12 0841  . traZODone (DESYREL) tablet 200 mg  200 mg Oral QHS Curlene Labrum Readling, MD   200 mg at 05/19/12 2139  . trihexyphenidyl (ARTANE) tablet 1 mg  1 mg Oral QHS Nehemiah Settle, MD   1 mg at 05/19/12 2138   Lab Results:  Results for orders placed during the hospital encounter of 05/12/12 (from the past 48 hour(s))  GLUCOSE, CAPILLARY     Status: Abnormal   Collection Time   05/18/12  9:54 PM      Component Value Range Comment   Glucose-Capillary 125 (*) 70 - 99 mg/dL   GLUCOSE, CAPILLARY     Status: Abnormal   Collection Time   05/19/12  6:11 AM      Component Value Range Comment   Glucose-Capillary 68 (*) 70 - 99 mg/dL   GLUCOSE, CAPILLARY     Status: Abnormal   Collection Time   05/19/12  6:45 AM      Component Value Range Comment   Glucose-Capillary 118 (*) 70 - 99 mg/dL   GLUCOSE, CAPILLARY     Status: Abnormal   Collection Time   05/19/12 12:01 PM      Component Value Range Comment   Glucose-Capillary 174 (*) 70 - 99 mg/dL    Comment 1 Notify RN     GLUCOSE, CAPILLARY     Status: Abnormal   Collection Time   05/19/12  5:09 PM      Component Value Range Comment   Glucose-Capillary 171 (*) 70 - 99 mg/dL    Comment 1 Notify RN     URINALYSIS, WITH MICROSCOPIC     Status: Normal   Collection Time   05/19/12  5:16 PM      Component Value Range Comment   Color, Urine YELLOW  YELLOW    APPearance CLEAR  CLEAR    Specific Gravity, Urine 1.012  1.005 - 1.030    pH 5.5  5.0 - 8.0    Glucose, UA NEGATIVE  NEGATIVE mg/dL    Hgb urine dipstick NEGATIVE  NEGATIVE    Bilirubin Urine NEGATIVE  NEGATIVE    Ketones, ur NEGATIVE  NEGATIVE mg/dL    Protein, ur NEGATIVE  NEGATIVE mg/dL    Urobilinogen, UA 0.2  0.0 - 1.0 mg/dL    Nitrite NEGATIVE  NEGATIVE    Leukocytes, UA NEGATIVE  NEGATIVE    Bacteria, UA RARE  RARE    Squamous Epithelial / LPF  RARE  RARE   GLUCOSE, CAPILLARY     Status: Abnormal   Collection Time  05/19/12  9:17 PM      Component Value Range Comment   Glucose-Capillary 197 (*) 70 - 99 mg/dL    Comment 1 Notify RN     GLUCOSE, CAPILLARY     Status: Normal   Collection Time   05/20/12  6:12 AM      Component Value Range Comment   Glucose-Capillary 92  70 - 99 mg/dL   GLUCOSE, CAPILLARY     Status: Abnormal   Collection Time   05/20/12 11:36 AM      Component Value Range Comment   Glucose-Capillary 67 (*) 70 - 99 mg/dL    Comment 1 Notify RN     GLUCOSE, CAPILLARY     Status: Abnormal   Collection Time   05/20/12 12:58 PM      Component Value Range Comment   Glucose-Capillary 155 (*) 70 - 99 mg/dL    Comment 1 Notify RN     GLUCOSE, CAPILLARY     Status: Normal   Collection Time   05/20/12  5:17 PM      Component Value Range Comment   Glucose-Capillary 78  70 - 99 mg/dL    Comment 1 Notify RN      Physical Findings: AIMS: Facial and Oral Movements Muscles of Facial Expression: None, normal Lips and Perioral Area: None, normal Jaw: None, normal Tongue: None, normal,Extremity Movements Upper (arms, wrists, hands, fingers): None, normal Lower (legs, knees, ankles, toes): None, normal, Trunk Movements Neck, shoulders, hips: None, normal, Overall Severity Severity of abnormal movements (highest score from questions above): None, normal Incapacitation due to abnormal movements: None, normal Patient's awareness of abnormal movements (rate only patient's report): No Awareness, Dental Status Current problems with teeth and/or dentures?: No Does patient usually wear dentures?: No   Review of Systems:  Neurological: The patient denies any headaches today. She denies any seizures or dizziness.  G.I.: The patient denies any constipation or G.I. Upset today.  Musculoskeletal: The patient denies any musculoskeletal issues today.  GU: Urinary Incontinence reported.  Patient's glucose levels continued to be  high   thinks she is getting close to her discharge now. Her anxiety has decreased since yesterday. She denies any suicidal or homicidal ideations today  She does not report auditory hallucinations today .She denies any medication related side effects today or any other concerns.   Treatment Plan Summary:  1. Daily contact with patient to assess and evaluate symptoms and progress in treatment.  2. Medication management  3. The patient will deny suicidal ideations or homicidal ideations for 48 hours prior to discharge and have a depression and anxiety rating of 3 or less. The patient will also deny any auditory or visual hallucinations or delusional thinking.   Plan:  1. Will continue the patient on her current non-psychiatric medications as listed above.  2. Will continue the medication Zoloft to 200 mg to help with the depression  3. Will continue the medication Ditropan XL at  10 mgs po qhs for urinary incontinence.  4. Will continue the medication Prolixin Decanoate 25 mgs IM q 2 weeks with this last given May 15, 2012 for auditory hallucinations.  5. Will continue the medication Prolixin to 15 mgs po qhs for  her auditory hallucinations.  6. Will continue the medication Artane at 1 mg po qhs for EPS.  7. Will continue the medication Trazodone 200 mgs po qhs for sleep.  8. Will continue the medication Neurontin at 300 mgs po q am, 2 pm and  hs for anxiety and pain.  9. Laboratory studies reviewed.     Wonda Cerise 05/20/2012, 6:10 PM

## 2012-05-20 NOTE — Progress Notes (Signed)
Psychoeducational Group Note  Date:  05/20/2012 Time: 1515  Group Topic/Focus:  Healthy Communication:   The focus of this group is to discuss communication, barriers to communication, as well as healthy ways to communicate with others.  Participation Level:  Did Not Attend  Participation Quality:    Affect:    Cognitive:    Insight:    Engagement in Group:    Additional Comments:  none  Marquis Lunch, Tomeshia Pizzi 05/20/2012, 6:46 PM

## 2012-05-20 NOTE — Progress Notes (Signed)
Psychoeducational Group Note  Date:  05/20/2012 Time:  0945 am  Group Topic/Focus:  Identifying Needs:   The focus of this group is to help patients identify their personal needs that have been historically problematic and identify healthy behaviors to address their needs.  Participation Level:  Did Not Attend    Andrena Mews 05/20/2012,10:17 AM

## 2012-05-20 NOTE — Progress Notes (Signed)
[facility] Group Notes:  (Counselor/Nursing/MHT/Case Management/Adjunct)  05/20/2012 10:14 PM  Type of Therapy:  Psychoeducational Skills  Participation Level:  Active  Participation Quality:  Appropriate  Affect:  Appropriate  Cognitive:  Appropriate  Insight:  Good  Engagement in Group:  Good  Engagement in Therapy:  Good  Modes of Intervention:  Support  Summary of Progress/Problems:   Christ Kick 05/20/2012, 10:14 PM

## 2012-05-20 NOTE — Progress Notes (Addendum)
Pt had an episode of mild hypoglycemia discovered during a routine CBG check  Pt was asymptomatic although she has slept all morning   The late sleeping is normal for this pt and she has been sleeping late almost everyday she has been hospitalized so that was not considered as a symptom   She did awaken with much encouragement and got a tube of glucose gel because she has no teeth to chew the tablet   Will continue to monitor and encourage eating lunch as pt said she had missed breakfast  Pt is safe now  Pt cbg at time of hypoglycemic episode was 67  It was checked an hour later and her cbg was 155  Pt took the glucose gel and then ate 75 percent of her lunch during that hour period

## 2012-05-20 NOTE — Progress Notes (Signed)
Psychoeducational Group Note  Date:  05/20/2012 Time:  2000  Group Topic/Focus:  Wrap-Up Group:   The focus of this group is to help patients review their daily goal of treatment and discuss progress on daily workbooks.  Participation Level:  Active  Participation Quality:  Appropriate  Affect:  Appropriate  Cognitive:  Appropriate  Insight:  Good  Engagement in Group:  Good  Additional Comments:  Pt shared in wrap up group but was off topic. Pt stated she still hears voices that tell her to do bad things and to urine on herself.   Audreyana Huntsberry A 05/20/2012, 2:09 AM

## 2012-05-20 NOTE — Progress Notes (Signed)
Patient ID: Sherri Hays, female   DOB: 10-12-1951, 61 y.o.   MRN: [id number]    [facility] Group Notes:  (Counselor/Nursing/MHT/Case Management/Adjunct)  05/20/2012 11 AM  Type of Therapy:  Aftercare Planning, Group Therapy, Dance/Movement Therapy   Participation Level:  Did Not Attend. Pt. was asleep.   Cassidi Long 05/20/2012. 11:47 AM

## 2012-05-20 NOTE — Progress Notes (Signed)
D   Pt has been in bed most of the day and she said she just doesn't feel good although she cannot really identify her symptoms or causes   She appears depressed but does brighten on approach  She has isolated today and did not attend groups this morning A   Verbal support given  Medications administered and effectiveness monitored   Q 15 min checks  R   Pt safe at present

## 2012-05-20 NOTE — Progress Notes (Signed)
D) PT C/O NOT FEELING WELL. STATED THAT SHE HEARD DEMONS VOICES BY THE NAMES OF BILLY,JOANNA AND LILLY TELLING HER THAT SHE WAS UGLY AND THAT THEY HATED HER.  DID NOT WANT TO GET OUT OF BED. PT CAME TO GROUP ABOUT HALF WAY BEFORE IT WAS FINISHED. A) ENCOURAGED PT TO GET OUT OF BED AND COME TO GROUP THERAPY. SUPPORT AND ENCOURAGEMENT GIVEN. Q15 MINUTE SAFETY CHECKS. R) DENIES SI,HI. REMAINED SAFE ON UNIT

## 2012-05-21 LAB — GLUCOSE, CAPILLARY
Glucose-Capillary: 95 mg/dL (ref 70–99)
Glucose-Capillary: 177 mg/dL — ABNORMAL HIGH (ref 70–99)

## 2012-05-21 NOTE — Progress Notes (Signed)
Psychoeducational Group Note  Date:  05/21/2012 Time:  0945 am  Group Topic/Focus:  Making Healthy Choices:   The focus of this group is to help patients identify negative/unhealthy choices they were using prior to admission and identify positive/healthier coping strategies to replace them upon discharge.  Participation Level:  Active  Participation Quality:  Sharing  Affect:  Appropriate  Cognitive:  Appropriate  Insight:  Good  Engagement in Group:  Good  Additional Comments:  Pt identified a situatiion in her life where she had made small steps to big change with help from others  She shared appropriately and was supportive of others  Andrena Mews 05/21/2012, 10:29 AM

## 2012-05-21 NOTE — Progress Notes (Signed)
Patient ID: KOLLYNS MICKELSON, female   DOB: [birth year], 61 y.o.   MRN: [id number]   [facility] Group Notes:  (Counselor/Nursing/MHT/Case Management/Adjunct)  05/21/2012 11 AM  Type of Therapy:  Aftercare Planning, Group Therapy, Dance/Movement Therapy   Participation Level:  Active  Participation Quality:  Appropriate  Affect:  Appropriate  Cognitive:  Appropriate  Insight:  Good  Engagement in Group:  Good  Engagement in Therapy:  Good  Modes of Intervention:  Clarification, Problem-solving, Role-play, Socialization and Support  Summary of Progress/Problems: After Care: Pt. did not attend aftercare planning group.  Counseling:  Therapist and group members discussed positive and negative support systems. Group members shared how they can support themselves when they leave the hospital. Pt. dad a very positive outlook and seemed cheerful. Pt. shared she supports herself by meditating, "maintain my cool."   Cassidi Long 05/21/2012. 11:48 AM

## 2012-05-21 NOTE — Progress Notes (Signed)
D   Pt has gotten out of bed with strong encouragement and attended and participated in groups  She is pleasant and engaging on approach   She continues to say she doesn't feel good but did agree to try to stay out of bed more today   Her thoughts are logically expressed and she has adequate problem solving skills  A   Verbal support given  Medications administered and effectiveness monitored   Will continue to encourage socialization and participation  And give positive reinforcement for efforts at same   Q 15 min checks R   Pt safe at present and expressed she was glad she made the effort to get out of bed to attend spirituality group and other unit activities

## 2012-05-22 DIAGNOSIS — F209 Schizophrenia, unspecified: Secondary | ICD-10-CM

## 2012-05-22 LAB — GLUCOSE, CAPILLARY: Glucose-Capillary: 153 mg/dL — ABNORMAL HIGH (ref 70–99)

## 2012-05-22 MED ORDER — METFORMIN HCL 500 MG PO TABS: 500.0000 mg | ORAL_TABLET | Freq: Two times a day (BID) | ORAL | Status: AC

## 2012-05-22 MED ORDER — FLUPHENAZINE HCL 5 MG PO TABS: 15.0000 mg | ORAL_TABLET | Freq: Every day | ORAL | Status: DC

## 2012-05-22 MED ORDER — AMLODIPINE BESYLATE 10 MG PO TABS: 10.0000 mg | ORAL_TABLET | Freq: Every day | ORAL | Status: DC

## 2012-05-22 MED ORDER — HYDRALAZINE HCL 50 MG PO TABS: 50.0000 mg | ORAL_TABLET | Freq: Three times a day (TID) | ORAL | Status: AC

## 2012-05-22 MED ORDER — FLUTICASONE-SALMETEROL 250-50 MCG/DOSE IN AEPB: 1.0000 | INHALATION_SPRAY | Freq: Two times a day (BID) | RESPIRATORY_TRACT | Status: AC

## 2012-05-22 MED ORDER — TRIHEXYPHENIDYL HCL 2 MG PO TABS: 1.0000 mg | ORAL_TABLET | Freq: Every day | ORAL | Status: AC

## 2012-05-22 MED ORDER — OXYBUTYNIN CHLORIDE ER 10 MG PO TB24: 10.0000 mg | ORAL_TABLET | Freq: Every day | ORAL | Status: DC

## 2012-05-22 MED ORDER — PANTOPRAZOLE SODIUM 40 MG PO TBEC: 40.0000 mg | DELAYED_RELEASE_TABLET | Freq: Every day | ORAL | Status: DC

## 2012-05-22 MED ORDER — TRAZODONE HCL 100 MG PO TABS: 200.0000 mg | ORAL_TABLET | Freq: Every day | ORAL | Status: DC

## 2012-05-22 MED ORDER — FLUPHENAZINE DECANOATE 25 MG/ML IJ SOLN: 25.0000 mg | INTRAMUSCULAR | Status: DC

## 2012-05-22 MED ORDER — DICLOFENAC SODIUM 75 MG PO TBEC: 75.0000 mg | DELAYED_RELEASE_TABLET | Freq: Two times a day (BID) | ORAL | Status: DC

## 2012-05-22 MED ORDER — GABAPENTIN 300 MG PO CAPS: 300.0000 mg | ORAL_CAPSULE | Freq: Three times a day (TID) | ORAL | Status: DC

## 2012-05-22 MED ORDER — FLUPHENAZINE HCL 5 MG PO TABS
15.0000 mg | ORAL_TABLET | Freq: Every day | ORAL | Status: DC
  Filled 2012-05-22 (×2): qty 15

## 2012-05-22 MED ORDER — INSULIN GLARGINE 100 UNIT/ML ~~LOC~~ SOLN: 30.0000 [IU] | Freq: Every day | SUBCUTANEOUS | Status: AC

## 2012-05-22 MED ORDER — ATORVASTATIN CALCIUM 10 MG PO TABS: 10.0000 mg | ORAL_TABLET | Freq: Every day | ORAL | Status: AC

## 2012-05-22 MED ORDER — SERTRALINE HCL 100 MG PO TABS: 200.0000 mg | ORAL_TABLET | Freq: Every day | ORAL | Status: AC

## 2012-05-22 NOTE — Discharge Summary (Signed)
Physician Discharge Summary Note  Patient:  Sherri Hays is an 61 y.o., female MRN:  [id number] DOB:  [birth year] Patient phone:  [phone] (home)  Patient address:   [address] [place] [address]   Date of Admission:  05/12/2012 Date of Discharge: 05/22/2012   Discharge Diagnoses: Principal Problem:  *Schizoaffective disorder, depressive type Active Problems:  DIABETES MELLITUS, TYPE II  HYPERTENSION  ASTHMA  COPD  Axis Diagnosis:  Discharge Diagnoses:  AXIS I: Schizophrenia undifferentiated type  AXIS II: Deferred  AXIS III:  Past Medical History   Diagnosis  Date   .  Hypertension    .  Dysphagia    .  TIA (transient ischemic attack)    .  Genital herpes    .  DM neuropathies    .  Anemia    .  Arthritis    .  Asthma    .  Cancer      lt breast   .  Schizophrenia, simple    .  Diabetes mellitus    .  Stroke     AXIS IV: problems with access to health care services  AXIS V: 51-60 moderate symptoms   Level of Care:  OP  Hospital Course:  The patient was admitted IVC due to increasing auditory and visual hallucinations related to medical non-compliance.  The patient was living with her boyfriend after leaving assisted living facility and had a great deal of difficulty managing her medications.  She had overdosed recently on a weeks worth of medication.     She was admitted to the [facility] unit for treatment of her psychotic symptoms.  She was on prolixin 10mg  per day per med reconciliation. Her Lantus had not been given at the ALF and this was also a problem for her.  Her health problems were treated accordingly and her home medications were restarted as noted.  Due to her persistent reports of demons her prolixin was increased to a 25mg  IM injection, and she was also given 10mg  of prolixin po.  Her depression was treated with Sertraline and titrated up to 200mg .  Her anxiety was treated with neruontin 300mg  capsules.     Her response to treatment was  monitored by daily evaluations with a clinical provider.  Sherri Hays was also asked to complete a daily self inventory to evaluate her mental and emotional response to treatment.  She was encouraged to participate in the unit milieu and to attend unit programming by going to daily groups.  Sherri Hays was a good participant and attended a majority of the groups.  Her behavior was appropriate and she contributed often.     On the day of discharge she felt much improved than upon her arrival.  She denied AH/VHl, reported no SI/HI.  She was eager for discharge and anticipated returning to her home with her boyfriend.  Sherri Hays will follow up with Home Health Care and her ACT team.  Consults: none  Significant Diagnostic Studies:  See labs  Discharge Vitals:   Blood pressure 159/79, pulse 88, temperature 97.4 F (36.3 C), temperature source Oral, resp. rate 14, height 5\' 2"  (1.575 m), weight 90.719 kg (200 lb)..  Mental Status Exam: See Mental Status Examination and Suicide Risk Assessment completed by Attending Physician prior to discharge.  Discharge destination:  Home  Is patient on multiple antipsychotic therapies at discharge:  No  Has Patient had three or more failed trials of antipsychotic monotherapy by history: N/A Recommended Plan  for Multiple Antipsychotic Therapies: N/A Discharge Orders    Future Orders Please Complete By Expires   Diet - low sodium heart healthy      Increase activity slowly      Discharge instructions      Comments:   Take all of your medications as prescribed.  Be sure to keep ALL follow up appointments as scheduled. This is to ensure getting your refills on time to avoid any interruption in your medication.  If you find that you can not keep your appointment, call the clinic and reschedule. Be sure to tell the nurse if you will need a refill before your appointment.     Medication List  As of 05/22/2012 11:50 AM   TAKE these medications      Indication    albuterol 108  (90 BASE) MCG/ACT inhaler   Commonly known as: PROVENTIL HFA;VENTOLIN HFA   Inhale 2 puffs into the lungs every 6 (six) hours as needed. For dyspnea       amLODipine 10 MG tablet   Commonly known as: NORVASC   Take 1 tablet (10 mg total) by mouth at bedtime. For hypertension.    Indication: High Blood Pressure      atorvastatin 10 MG tablet   Commonly known as: LIPITOR   Take 1 tablet (10 mg total) by mouth at bedtime. For cholesterol.    Indication: Type II A Hyperlipidemia      diclofenac 75 MG EC tablet   Commonly known as: VOLTAREN   Take 1 tablet (75 mg total) by mouth 2 (two) times daily. For osteoarthritis pain.    Indication: Joint Damage causing Pain and Loss of Function      fluPHENAZine 5 MG tablet   Commonly known as: PROLIXIN   Take 3 tablets (15 mg total) by mouth at bedtime. For psychosis.    Indication: Psychosis      fluPHENAZine decanoate 25 MG/ML injection   Commonly known as: PROLIXIN   Inject 1 mL (25 mg total) into the muscle every 14 (fourteen) days. For psychosis.    Indication: Psychosis      Fluticasone-Salmeterol 250-50 MCG/DOSE Aepb   Commonly known as: ADVAIR   Inhale 1 puff into the lungs 2 (two) times daily. For COPD       gabapentin 300 MG capsule   Commonly known as: NEURONTIN   Take 1 capsule (300 mg total) by mouth 3 (three) times daily. For anxiety and depression.    Indication: Pain      hydrALAZINE 50 MG tablet   Commonly known as: APRESOLINE   Take 1 tablet (50 mg total) by mouth 3 (three) times daily. For elevated blood pressure.    Indication: High Blood Pressure      insulin glargine 100 UNIT/ML injection   Commonly known as: LANTUS   Inject 30 Units into the skin at bedtime. For glycemic control.    Indication: Type 2 Diabetes      metFORMIN 500 MG tablet   Commonly known as: GLUCOPHAGE   Take 1 tablet (500 mg total) by mouth 2 (two) times daily with a meal. For glycemic control.    Indication: Type 2 Diabetes       oxybutynin 10 MG 24 hr tablet   Commonly known as: DITROPAN-XL   Take 1 tablet (10 mg total) by mouth at bedtime. For overactive bladder.    Indication: Urinary Incontinence      pantoprazole 40 MG tablet   Commonly known as: PROTONIX  Take 1 tablet (40 mg total) by mouth daily. For Reflux.       sertraline 100 MG tablet   Commonly known as: ZOLOFT   Take 2 tablets (200 mg total) by mouth daily. For depression and anxiety.    Indication: Major Depressive Disorder      traZODone 100 MG tablet   Commonly known as: DESYREL   Take 2 tablets (200 mg total) by mouth at bedtime. For insomnia.    Indication: Trouble Sleeping      trihexyphenidyl 2 MG tablet   Commonly known as: ARTANE   Take 0.5 tablets (1 mg total) by mouth at bedtime. For EPS.    Indication: Extrapyramidal Reaction caused by Medications           Follow-up Information    Follow up with ACT Team on 05/22/2012. (Team will come to your home the same day that you are discharged from the hospital)    Contact information:   [facility] [address] [facility] [place]  [address] Telephone:  [phone] or [phone]       Follow up with [facility]. (Will come to your home after your discharge to assess your medical needs)    Contact information:   N/A        Follow-up recommendations:   Activities: Resume typical activities Diet: Resume typical diet Other: Follow up with outpatient provider and report any side effects to out patient prescriber.  Comments:  Take all your medications as prescribed by your mental healthcare provider. Report any adverse effects and or reactions from your medicines to your outpatient provider promptly. Patient is instructed and cautioned to not engage in alcohol and or illegal drug use while on prescription medicines. In the event of worsening symptoms, patient is instructed to call the crisis hotline, 911 and or go to the nearest ED for appropriate evaluation  and treatment of symptoms.  Signed: Rona Ravens. Frankee Gritz PAC For Dr. Nelly Rout 05/22/2012 11:50 AM

## 2012-05-22 NOTE — [facility] Suicide Risk Assessment (Addendum)
Suicide Risk Assessment  Discharge Assessment     Demographic factors:  Low socioeconomic status;Unemployed    Current Mental Status Per Nursing Assessment::   On Admission:   05/12/2012 At Discharge:   05/22/2012  Current Mental Status Per Physician: Patient is alert and oriented x3. She reported her mood is good, her affect was bright and full. Patient denied any suicidal ideation any homicidal ideation any delusions or paranoia. She denied any perceptual problems. Her insight and her behavior and illness seems fair at this time and so does her judgment. Patient understands that she needs to be compliant with her medications, follow a diabetic diet and keep her appointments.  Loss Factors: Decline in physical health;Financial problems / change in socioeconomic status  Historical Factors:    Risk Reduction Factors: Risk Reduction Factors: Sense of responsibility to family;Religious beliefs about death;Living with another person, especially a relative;Positive social support;Positive therapeutic relationship     Continued Clinical Symptoms:  Schizophrenia:   Paranoid or undifferentiated type  Discharge Diagnoses:   AXIS I:  Schizophrenia undifferentiated type AXIS II:  Deferred AXIS III:   Past Medical History  Diagnosis Date  . Hypertension   . Dysphagia   . TIA (transient ischemic attack)   . Genital herpes   . DM neuropathies   . Anemia   . Arthritis   . Asthma   . Cancer     lt breast  . Schizophrenia, simple   . Diabetes mellitus   . Stroke    AXIS IV:  problems with access to health care services AXIS V:  51-60 moderate symptoms  Cognitive Features That Contribute To Risk:  None at this time as patient is cognitively stable, has a positive outlook   Suicide Risk:  Minimal: No identifiable suicidal ideation.  Patients presenting with no risk factors but with morbid ruminations; may be classified as minimal risk based on the severity of the depressive  symptoms  Plan Of Care/Follow-up recommendations:  Activity:  As tolerated Diet:  Diabetic diet Other:  Followup with primary care physician  for her diabetes. Patient's sugars were better this morning Patient to followup with home health care and ACT Team  [facility] 05/22/2012, 12:13 PM

## 2012-05-22 NOTE — Tx Team (Addendum)
Interdisciplinary Treatment Plan Update (Adult)  Date:  05/22/2012  Time Reviewed:  10:15AM-11:15AM  Progress in Treatment: Attending groups:  Yes Participating in groups:    Yes Taking medication as prescribed:    Yes Tolerating medication:   Yes Family/Significant other contact made:  Yes, with [facility] and LME Patient understands diagnosis:   Yes Discussing patient identified problems/goals with staff:   Yes Medical problems stabilized or resolved:   Yes Denies suicidal/homicidal ideation:  Yes Issues/concerns per patient self-inventory:   Yes Other:    New problem(s) identified: No, Describe:    Reason for Continuation of Hospitalization: None  Interventions implemented related to continuation of hospitalization:  Medication monitoring and adjustment, safety checks Q15 min., suicide risk assessment, group therapy, psychoeducation, collateral contact, aftercare planning, ongoing physician assessments, medication education - UNTIL DISCHARGE  Additional comments:  Not applicable  Estimated length of stay:  Discharge today  Discharge Plan:  Go home via taxi to apartment she shares with boyfriend.  Follow up with [facility], which has agreed to see her every other day for awhile.  Follow up with [facility], which will come to her home day after discharge to perform assessment on Home Health needs, including for diabetes.  Follow up with Dr. Tyson Dense at [facility] [address] location) for medical needs.  New goal(s):  Not applicable  Review of initial/current patient goals per problem list:   1.  Goal(s):  Eliminate SI  Met:  Yes  Target date:  By Discharge   As evidenced by:  Has had no SI since admission  2.  Goal(s):  Eliminate psychosis (auditory hallucinations and delusions)  Met:  Yes  Target date:  By Discharge   As evidenced by:  Voices and demons have been gone since Thursday 7/18.  3.  Goal(s):  Determine  safe plan for discharge  Met:  Yes  Target date:  By Discharge   As evidenced by:  See above  4.  Goal(s):  Decrease depression to no greater than 3 at discharge  Met:  Yes  Target date:  By Discharge   As evidenced by:  0 depression today (at one point this morning stated it was 8, and it was pointed out to patient that she needs to be stable, not up and down like this.  She started to cry when she thought she might not be discharged, and was assured that she will be discharged, but her ACT Team needs to continue to work with her on skills to avoid frequent mood changes.)  Attendees: Patient:  Sherri Hays  05/22/2012 10:15AM-11:15AM  Family:     Physician:  Dr. Nelly Rout 05/22/2012 10:15AM-11:15AM  Nursing:   Joslyn Devon, RN 05/22/2012 10:15AM -11:15AM   Case Manager:  Ambrose Mantle, LCSW 05/22/2012 10:15AM-11:15AM  Counselor:  Veto Kemps, MT-BC 05/22/2012 10:15AM-11:15AM  Other:   Neill Loft, RN 05/22/2012   Other:      Other:      Other:       Scribe for Treatment Team:   Sarina Ser, 05/22/2012, 10:15AM-11:15AM

## 2012-05-22 NOTE — Progress Notes (Signed)
D patient slept well last nite and her appetite is good, is excited about going home today, states she is 8/10 depressed and 8/10 hoepless, denies Si or Hi. A attended group, ate meals in the DR, taking meds as ordered by MD, q27min safety checks continue and support offered R safety of patient maintained

## 2012-05-22 NOTE — Progress Notes (Signed)
05/22/2012         Time: 0930      Group Topic/Focus: The focus of this group is on enhancing the patient's understanding of leisure, barriers to leisure, and the importance of engaging in positive leisure activities upon discharge for improved total health.  Participation Level: Active  Participation Quality: Appropriate and Attentive  Affect: Appropriate  Cognitive: Oriented   Additional Comments: Patient bright, reports she hasn't heard any voices since Thursday and is hopeful she will be discharged today. Rosenda was able to identify leisure interests she can engage in upon discharge.   Dionta Larke 05/22/2012 11:55 AM

## 2012-05-22 NOTE — Progress Notes (Signed)
[facility] Case Management Discharge Plan:  Will you be returning to the same living situation after discharge: Yes,  independent apartment At discharge, do you have transportation home?:Yes,  wants to call a taxi Do you have the ability to pay for your medications:Yes,  income and insurance  Interagency Information:     Release of information consent forms completed and in the chart;  Patient's signature needed at discharge.  Patient to Follow up at:  Follow-up Information    Follow up with ACT Team on 05/22/2012. (Team will come to your home the same day that you are discharged from the hospital)    Contact information:   [facility] [address] [facility] [place]  [address] Telephone:  [phone] or [phone]       Follow up with [facility] on 05/23/2012. (Will come to your home after your discharge to assess your medical needs)    Contact information:   [address], [place]  [phone]      Follow up with Dr. Tyson Dense on 05/30/2012. (9:30AM appointment -- Bring your $3 co-pay, plus $15 that is due for other visits (total of $18 if possible))    Contact information:   [facility] [address] [place] [address] Telephone:  [phone]  Fax:  [phone]         Patient denies SI/HI:   Yes,      Safety Planning and Suicide Prevention discussed:  Yes,    During Aftercare Planning Groups throughout stay, Case Manager provided psychoeducation on "Suicide Prevention Information."  This included descriptions of risk factors for suicide, warning signs that an individual is in crisis and thinking of suicide, and what to do if this occurs.  Pt indicated understanding of information provided, and will read brochure given upon discharge.     Barrier to discharge identified:No.  Summary and Recommendations:  Go home via taxi to apartment she shares with boyfriend. Follow up with [facility], which has agreed to see her every other  day for awhile. Follow up with [facility], which will come to her home day after discharge to perform assessment on Home Health needs, including for diabetes. Follow up with Dr. Tyson Dense at [facility] [address] location) for medical needs.    Sarina Ser 05/22/2012, 12:48 PM

## 2012-05-22 NOTE — Progress Notes (Signed)
Patient ID: Sherri Hays, female   DOB: 16-Dec-1950, 61 y.o.   MRN: [id number] Patient ID: Sherri Hays, female   DOB: 1950-11-20, 61 y.o.   MRN: [id number] [facility] MD Progress Note  05/22/2012 9:07 AM  Diagnosis:  Axis I: Schizoaffective Disorder - Depressed Type.   The patient was seen today and reports the following:   ADL's: Intact.  Sleep: fair  Appetite: The patient reports that her appetite is good today.   Mild>(1-10) >Severe  Hopelessness (1-10): 2  Depression (1-10): 4 Anxiety (1-10): 4  Suicidal Ideation: The patient denies any suicidal ideations today.  Plan: No  Intent: No  Means: No   Homicidal Ideation: The patient denies any homicidal ideations today.  Plan: No  Intent: No.  Means: No   General Appearance/Behavior: The patient remains friendly and cooperative today with this provider.  Eye Contact: Good.  Speech:  Appropriate in volume rate and tone, spontaneous Motor Behavior: wnl.  Level of Consciousness: Alert and Oriented x 3.  Mental Status: Alert and Oriented x 3.  Mood: Appears severely depressed.  Affect: Moderately constricted.  Anxiety Level: Severe anxiety reported today.  Thought Process: no Auditory hallucinations reported.  Thought Content: No delusions  Perception: No Auditory hallucinations reported.  Judgment: Fair.  Insight: Fair.  Cognition: Oriented to person, place and time.  Sleep:  Number of Hours: 6.25    Vital Signs:Blood pressure 159/79, pulse 88, temperature 97.4 F (36.3 C), temperature source Oral, resp. rate 14, height 5\' 2"  (1.575 m), weight 90.719 kg (200 lb).  Current Medications: Current Facility-Administered Medications  Medication Dose Route Frequency Provider Last Rate Last Dose  . acetaminophen (TYLENOL) tablet 650 mg  650 mg Oral Q6H PRN Nehemiah Settle, MD   650 mg at 05/21/12 1404  . albuterol (PROVENTIL HFA;VENTOLIN HFA) 108 (90 BASE) MCG/ACT inhaler 2 puff  2 puff Inhalation Q6H PRN Nehemiah Settle, MD   2 puff at 05/21/12 1405  . alum & mag hydroxide-simeth (MAALOX/MYLANTA) 200-200-20 MG/5ML suspension 30 mL  30 mL Oral Q4H PRN Nehemiah Settle, MD   30 mL at 05/15/12 0945  . amLODipine (NORVASC) tablet 10 mg  10 mg Oral QHS Nehemiah Settle, MD   10 mg at 05/21/12 2145  . atorvastatin (LIPITOR) tablet 10 mg  10 mg Oral QHS Nehemiah Settle, MD   10 mg at 05/21/12 2145  . dextrose (GLUTOSE) 40 % oral gel 37.5 g  1 Tube Oral PRN Curlene Labrum Readling, MD   37.5 g at 05/20/12 1140  . diclofenac (VOLTAREN) EC tablet 75 mg  75 mg Oral BID Nehemiah Settle, MD   75 mg at 05/22/12 7829  . fluPHENAZine (PROLIXIN) tablet 15 mg  15 mg Oral QHS Curlene Labrum Readling, MD   15 mg at 05/21/12 2145  . fluPHENAZine decanoate (PROLIXIN) injection 25 mg  25 mg Intramuscular Q14 Days Curlene Labrum Readling, MD   25 mg at 05/15/12 1619  . Fluticasone-Salmeterol (ADVAIR) 250-50 MCG/DOSE inhaler 1 puff  1 puff Inhalation BID Nehemiah Settle, MD   1 puff at 05/21/12 2000  . gabapentin (NEURONTIN) capsule 300 mg  300 mg Oral [id number] Curlene Labrum Readling, MD   300 mg at 05/22/12 0823  . hydrALAZINE (APRESOLINE) tablet 50 mg  50 mg Oral [id number] Curlene Labrum Readling, MD   50 mg at 05/22/12 5621  . hydrOXYzine (ATARAX/VISTARIL) tablet 50 mg  50 mg Oral QHS PRN,MR X 1 Nehemiah Settle, MD  50 mg at 05/16/12 0138  . insulin glargine (LANTUS) injection 30 Units  30 Units Subcutaneous QHS Nehemiah Settle, MD   30 Units at 05/21/12 2145  . magnesium hydroxide (MILK OF MAGNESIA) suspension 30 mL  30 mL Oral Daily PRN Nehemiah Settle, MD      . metFORMIN (GLUCOPHAGE) tablet 500 mg  500 mg Oral BID WC Nehemiah Settle, MD   500 mg at 05/22/12 0823  . nicotine (NICODERM CQ - dosed in mg/24 hours) patch 21 mg  21 mg Transdermal Daily Curlene Labrum Readling, MD   21 mg at 05/22/12 0828  . oxybutynin (DITROPAN-XL) 24 hr tablet 10 mg  10 mg Oral QHS Curlene Labrum  Readling, MD   10 mg at 05/21/12 2145  . pantoprazole (PROTONIX) EC tablet 40 mg  40 mg Oral Q2200 Curlene Labrum Readling, MD   40 mg at 05/21/12 2145  . sertraline (ZOLOFT) tablet 200 mg  200 mg Oral Daily Nelly Rout, MD   200 mg at 05/22/12 0823  . traZODone (DESYREL) tablet 200 mg  200 mg Oral QHS Curlene Labrum Readling, MD   200 mg at 05/21/12 2145  . trihexyphenidyl (ARTANE) tablet 1 mg  1 mg Oral QHS Nehemiah Settle, MD   1 mg at 05/21/12 2145   Lab Results:  Results for orders placed during the hospital encounter of 05/12/12 (from the past 48 hour(s))  GLUCOSE, CAPILLARY     Status: Abnormal   Collection Time   05/20/12 11:36 AM      Component Value Range Comment   Glucose-Capillary 67 (*) 70 - 99 mg/dL    Comment 1 Notify RN     GLUCOSE, CAPILLARY     Status: Abnormal   Collection Time   05/20/12 12:58 PM      Component Value Range Comment   Glucose-Capillary 155 (*) 70 - 99 mg/dL    Comment 1 Notify RN     GLUCOSE, CAPILLARY     Status: Normal   Collection Time   05/20/12  5:17 PM      Component Value Range Comment   Glucose-Capillary 78  70 - 99 mg/dL    Comment 1 Notify RN     GLUCOSE, CAPILLARY     Status: Abnormal   Collection Time   05/20/12  8:54 PM      Component Value Range Comment   Glucose-Capillary 146 (*) 70 - 99 mg/dL    Comment 1 Notify RN     GLUCOSE, CAPILLARY     Status: Normal   Collection Time   05/21/12  6:11 AM      Component Value Range Comment   Glucose-Capillary 95  70 - 99 mg/dL   GLUCOSE, CAPILLARY     Status: Abnormal   Collection Time   05/21/12 11:30 AM      Component Value Range Comment   Glucose-Capillary 177 (*) 70 - 99 mg/dL    Comment 1 Notify RN     GLUCOSE, CAPILLARY     Status: Abnormal   Collection Time   05/21/12  5:01 PM      Component Value Range Comment   Glucose-Capillary 140 (*) 70 - 99 mg/dL   GLUCOSE, CAPILLARY     Status: Abnormal   Collection Time   05/21/12  9:20 PM      Component Value Range Comment    Glucose-Capillary 226 (*) 70 - 99 mg/dL    Comment 1 Notify RN  GLUCOSE, CAPILLARY     Status: Normal   Collection Time   05/22/12  6:39 AM      Component Value Range Comment   Glucose-Capillary 98  70 - 99 mg/dL    Comment 1 Notify RN      Physical Findings: AIMS: Facial and Oral Movements Muscles of Facial Expression: None, normal Lips and Perioral Area: None, normal Jaw: None, normal Tongue: None, normal,Extremity Movements Upper (arms, wrists, hands, fingers): None, normal Lower (legs, knees, ankles, toes): None, normal, Trunk Movements Neck, shoulders, hips: None, normal, Overall Severity Severity of abnormal movements (highest score from questions above): None, normal Incapacitation due to abnormal movements: None, normal Patient's awareness of abnormal movements (rate only patient's report): No Awareness, Dental Status Current problems with teeth and/or dentures?: No Does patient usually wear dentures?: No   Review of Systems:  Neurological: The patient denies any headaches today. She denies any seizures or dizziness.  G.I.: The patient denies any constipation or G.I. Upset today.  Musculoskeletal: The patient denies any musculoskeletal issues today.  GU: Urinary Incontinence reported.  Patient's glucose levels continued to be high   seen on 7/21. In better mood. Thinks she is getting close to her discharge now.  She denies any suicidal or homicidal ideations now.  She does not report auditory hallucinations .She denies any medication related side effects today or any other concerns.   Treatment Plan Summary:  1. Daily contact with patient to assess and evaluate symptoms and progress in treatment.  2. Medication management  3. The patient will deny suicidal ideations or homicidal ideations for 48 hours prior to discharge and have a depression and anxiety rating of 3 or less. The patient will also deny any auditory or visual hallucinations or delusional thinking.   Plan:   1. Will continue the patient on her current non-psychiatric medications as listed above.  2. Will continue the medication Zoloft to 200 mg to help with the depression  3. Will continue the medication Ditropan XL at  10 mgs po qhs for urinary incontinence.  4. Will continue the medication Prolixin Decanoate 25 mgs IM q 2 weeks with this last given May 15, 2012 for auditory hallucinations.  5. Will continue the medication Prolixin to 15 mgs po qhs for  her auditory hallucinations.  6. Will continue the medication Artane at 1 mg po qhs for EPS.  7. Will continue the medication Trazodone 200 mgs po qhs for sleep.  8. Will continue the medication Neurontin at 300 mgs po q am, 2 pm and hs for anxiety and pain.  9. Laboratory studies reviewed.     Wonda Cerise 05/22/2012, 9:07 AM

## 2012-05-22 NOTE — Progress Notes (Signed)
Pt is D/C home. Pt denies SI/HI/AV. Pt follow-up and medications were reviewed and pt verbalized understanding. Pt pleasant and cooperative. Pt belongings were returned.   

## 2012-05-22 NOTE — Therapy (Signed)
Psychoeducational Group Note  Date:  05/21/2012 Time:  2005  Group Topic/Focus:  Wrap-Up Group:   The focus of this group is to help patients review their daily goal of treatment and discuss progress on daily workbooks.  Participation Level:  Active  Participation Quality:  Attentive  Affect:  Appropriate  Cognitive:  Alert  Insight:  Good  Engagement in Group:  Good  Additional Comments:   Patient attended and participated in wrap-up group this evening. Pt stated that she was sad because she missed getting to see her significant other and that the boyfriend could not visit because he did not have a car.  Deserie Dirks, [place] Pigg 05/22/2012, 2:41 AM

## 2012-05-22 NOTE — Progress Notes (Signed)
[facility] Group Notes:  (Counselor/Nursing/MHT/Case Management/Adjunct)  05/22/2012 2:35 PM  Type of Therapy:  Group Therapy  Participation Level:  Active  Participation Quality:  Attentive and Sharing  Affect:  Appropriate  Cognitive:  Oriented  Insight:  Limited  Engagement in Group:  Good  Engagement in Therapy:  Good  Modes of Intervention:  Education, Problem-solving and Support  Summary of Progress/Problems: Patient came to group late. She is eager for discharge and directed counselor to make sure group let out in time for her to leave. She stated that she will take her medications and keep up with them through the use of pill boxes. Sherri Hays 05/22/2012, 2:35 PM

## 2012-05-23 ENCOUNTER — Other Ambulatory Visit (INDEPENDENT_AMBULATORY_CARE_PROVIDER_SITE_OTHER): Payer: Self-pay

## 2012-05-23 DIAGNOSIS — C50919 Malignant neoplasm of unspecified site of unspecified female breast: Secondary | ICD-10-CM

## 2012-05-24 ENCOUNTER — Telehealth: Payer: Self-pay | Admitting: *Deleted

## 2012-05-24 NOTE — Progress Notes (Signed)
Patient Discharge Instructions:  After Visit Summary (AVS):   Faxed to:  05/24/2012 Psychiatric Admission Assessment Note:   Faxed to:  05/24/2012 Suicide Risk Assessment - Discharge Assessment:   Faxed to:  05/24/2012 Faxed/Sent to the [facility] Care provider:  05/24/2012  Faxed to [facility] @ [phone] And to the [facility] - Dr. Tyson Dense @ [phone] And to [facility] - ACT Team @ [phone]  Wandra Scot, 05/24/2012, 6:03 PM

## 2012-05-24 NOTE — Telephone Encounter (Signed)
Unable to get through on phone.  Will try again.

## 2012-05-26 ENCOUNTER — Telehealth: Payer: Self-pay | Admitting: *Deleted

## 2012-05-26 NOTE — Telephone Encounter (Signed)
Attempted to contact pt to schedule appt with Med Onc.  Msg stating person not available. Will attempt to call again.  Informed Dr. Rosezena Sensor nurse of inability to reach pt.

## 2012-05-27 ENCOUNTER — Emergency Department (HOSPITAL_COMMUNITY): Payer: Medicaid Other

## 2012-05-27 ENCOUNTER — Emergency Department (HOSPITAL_COMMUNITY)
Admission: EM | Admit: 2012-05-27 | Discharge: 2012-05-28 | Disposition: A | Discharge status: Home / Self Care | Payer: Medicaid Other | Attending: Emergency Medicine | Admitting: Emergency Medicine

## 2012-05-27 ENCOUNTER — Encounter (HOSPITAL_COMMUNITY): Payer: Self-pay | Admitting: *Deleted

## 2012-05-27 DIAGNOSIS — Z9089 Acquired absence of other organs: Secondary | ICD-10-CM | POA: Insufficient documentation

## 2012-05-27 DIAGNOSIS — Z79899 Other long term (current) drug therapy: Secondary | ICD-10-CM | POA: Insufficient documentation

## 2012-05-27 DIAGNOSIS — I10 Essential (primary) hypertension: Secondary | ICD-10-CM | POA: Insufficient documentation

## 2012-05-27 DIAGNOSIS — W19XXXA Unspecified fall, initial encounter: Secondary | ICD-10-CM | POA: Insufficient documentation

## 2012-05-27 DIAGNOSIS — Z8673 Personal history of transient ischemic attack (TIA), and cerebral infarction without residual deficits: Secondary | ICD-10-CM | POA: Insufficient documentation

## 2012-05-27 DIAGNOSIS — F17200 Nicotine dependence, unspecified, uncomplicated: Secondary | ICD-10-CM | POA: Insufficient documentation

## 2012-05-27 DIAGNOSIS — Z8659 Personal history of other mental and behavioral disorders: Secondary | ICD-10-CM | POA: Insufficient documentation

## 2012-05-27 DIAGNOSIS — R079 Chest pain, unspecified: Secondary | ICD-10-CM | POA: Insufficient documentation

## 2012-05-27 DIAGNOSIS — R269 Unspecified abnormalities of gait and mobility: Secondary | ICD-10-CM | POA: Insufficient documentation

## 2012-05-27 DIAGNOSIS — Z853 Personal history of malignant neoplasm of breast: Secondary | ICD-10-CM | POA: Insufficient documentation

## 2012-05-27 DIAGNOSIS — E119 Type 2 diabetes mellitus without complications: Secondary | ICD-10-CM | POA: Insufficient documentation

## 2012-05-27 DIAGNOSIS — J45909 Unspecified asthma, uncomplicated: Secondary | ICD-10-CM | POA: Insufficient documentation

## 2012-05-27 MED ORDER — SODIUM CHLORIDE 0.9 % IV SOLN
1000.0000 mL | INTRAVENOUS | Status: DC
  Administered 2012-05-28: 1000 mL via INTRAVENOUS

## 2012-05-27 NOTE — ED Notes (Signed)
Pt states she was released from [facility] 3 days ago and that she can't walk.  Pt denies SI / HI  Pt has flat affect

## 2012-05-27 NOTE — ED Notes (Signed)
[id number]<AV> Expected date:<BR> Expected time:<BR> Means of arrival:<BR> Comments:<BR> Medic 90, Rm 25, Falling/Psych History

## 2012-05-27 NOTE — ED Provider Notes (Signed)
History     CSN: [id number]  Arrival date & time 05/27/12  2203   First MD Initiated Contact with Patient 05/27/12 2302      Chief Complaint  Patient presents with  . Fall    HPI Pt fell three days ago.  Since then she has had trouble walking and getting up.  Pt states she hit the right side of her head but nothing is hurting.    Pt still can move her arms and legs but does not think she can move them well.  No vomiting or diarrhea.  No fevers, no cough.  When she tries to walk she will fall to the left and right side.  No alcohol use.   Pt had been in the psychiatric hospital recently and was started on some new medications.  Past Medical History  Diagnosis Date  . Hypertension   . Dysphagia   . TIA (transient ischemic attack)   . Genital herpes   . DM neuropathies   . Anemia   . Arthritis   . Asthma   . Cancer     lt breast  . Schizophrenia, simple   . Diabetes mellitus   . Stroke     Past Surgical History  Procedure Date  . Cholecystectomy   . Cesarean section   . Tonsillectomy     History reviewed. No pertinent family history.  History  Substance Use Topics  . Smoking status: Current Everyday Smoker -- 0.5 packs/day    Types: Cigarettes  . Smokeless tobacco: Never Used  . Alcohol Use: No    OB History    Grav Para Term Preterm Abortions TAB SAB Ect Mult Living                  Review of Systems  Constitutional: Positive for fatigue. Negative for fever.  HENT: Negative for neck pain.   Respiratory: Negative for cough.   Cardiovascular: Negative for chest pain.  Gastrointestinal: Negative for abdominal pain.  Musculoskeletal: Negative for back pain.  Neurological: Negative for syncope.  Psychiatric/Behavioral: Negative for confusion.  All other systems reviewed and are negative.    Allergies  Penicillins  Home Medications   Current Outpatient Rx  Name Route Sig Dispense Refill  . AMLODIPINE BESYLATE 10 MG PO TABS Oral Take 1 tablet (10 mg  total) by mouth at bedtime. For hypertension. 30 tablet 0  . ATORVASTATIN CALCIUM 10 MG PO TABS Oral Take 1 tablet (10 mg total) by mouth at bedtime. For cholesterol.    . DICLOFENAC SODIUM 75 MG PO TBEC Oral Take 1 tablet (75 mg total) by mouth 2 (two) times daily. For osteoarthritis pain.    Marland Kitchen FLUPHENAZINE HCL 5 MG PO TABS Oral Take 3 tablets (15 mg total) by mouth at bedtime. For psychosis. 90 tablet 0  . FLUPHENAZINE DECANOATE 25 MG/ML IJ SOLN Intramuscular Inject 1 mL (25 mg total) into the muscle every 14 (fourteen) days. For psychosis. 5 mL 0  . FLUTICASONE-SALMETEROL 250-50 MCG/DOSE IN AEPB Inhalation Inhale 1 puff into the lungs 2 (two) times daily. For COPD 60 each 11  . GABAPENTIN 300 MG PO CAPS Oral Take 1 capsule (300 mg total) by mouth 3 (three) times daily. For anxiety and depression. 90 capsule 0  . HYDRALAZINE HCL 50 MG PO TABS Oral Take 1 tablet (50 mg total) by mouth 3 (three) times daily. For elevated blood pressure. 90 tablet 0  . INSULIN GLARGINE 100 UNIT/ML [place] SOLN Subcutaneous Inject 30 Units  into the skin at bedtime. For glycemic control. 10 mL   . METFORMIN HCL 500 MG PO TABS Oral Take 1 tablet (500 mg total) by mouth 2 (two) times daily with a meal. For glycemic control.    . OXYBUTYNIN CHLORIDE ER 10 MG PO TB24 Oral Take 1 tablet (10 mg total) by mouth at bedtime. For overactive bladder. 30 tablet 0  . PANTOPRAZOLE SODIUM 40 MG PO TBEC Oral Take 1 tablet (40 mg total) by mouth daily. For Reflux.    . SERTRALINE HCL 100 MG PO TABS Oral Take 2 tablets (200 mg total) by mouth daily. For depression and anxiety. 60 tablet 0  . TRAZODONE HCL 100 MG PO TABS Oral Take 2 tablets (200 mg total) by mouth at bedtime. For insomnia. 60 tablet 0  . TRIHEXYPHENIDYL HCL 2 MG PO TABS Oral Take 0.5 tablets (1 mg total) by mouth at bedtime. For EPS. 15 tablet 0  . ALBUTEROL SULFATE HFA 108 (90 BASE) MCG/ACT IN AERS Inhalation Inhale 2 puffs into the lungs every 6 (six) hours as needed. For  dyspnea 1 Inhaler 11    BP 147/58  Pulse 77  Temp 98.5 F (36.9 C) (Oral)  Resp 21  Ht 5\' 3"  (1.6 m)  SpO2 98%  Physical Exam  Nursing note and vitals reviewed. Constitutional: She is oriented to person, place, and time. She appears well-developed and well-nourished. No distress.  HENT:  Head: Normocephalic and atraumatic.  Right Ear: External ear normal.  Left Ear: External ear normal.  Mouth/Throat: Oropharynx is clear and moist.  Eyes: Conjunctivae are normal. Right eye exhibits no discharge. Left eye exhibits no discharge. No scleral icterus.  Neck: Neck supple. No tracheal deviation present.  Cardiovascular: Normal rate, regular rhythm and intact distal pulses.   Pulmonary/Chest: Effort normal and breath sounds normal. No stridor. No respiratory distress. She has no wheezes. She has no rales.       Prolonged end expiration  Abdominal: Soft. Bowel sounds are normal. She exhibits no distension. There is no tenderness. There is no rebound and no guarding.  Musculoskeletal: She exhibits no edema and no tenderness.  Neurological: She is alert and oriented to person, place, and time. She has normal strength. No cranial nerve deficit ( no gross defecits noted) or sensory deficit. She exhibits normal muscle tone. She displays no seizure activity. Coordination normal.       No pronator drift bilateral upper extrem, able to hold both legs off bed for 5 seconds, sensation intact in all extremities, no visual field cuts, no left or right sided neglect, speech slow but pt alert and oriented  Skin: Skin is warm and dry. No rash noted. She is not diaphoretic.  Psychiatric: She has a normal mood and affect. Thought content normal. Her speech is not rapid and/or pressured. She is slowed. Cognition and memory are normal. She does not exhibit a depressed mood.    ED Course  Procedures (including critical care time) Rate 76 SINUS RHYTHM ~ normal P axis, V-rate 50- 99 NONSPECIFIC T ABNORMALITIES,  LATERAL LEADS ~ T <-0.51mV, I aVL V5 V6 Changes are new since last tracing   Labs Reviewed  COMPREHENSIVE METABOLIC PANEL - Abnormal; Notable for the following:    Potassium 3.4 (*)     Glucose, Bld 113 (*)     Creatinine, Ser 0.49 (*)     Albumin 2.9 (*)     Total Bilirubin 0.1 (*)     All other components within normal  limits  CBC WITH DIFFERENTIAL - Abnormal; Notable for the following:    WBC 12.3 (*)     Hemoglobin 10.7 (*)     HCT 33.1 (*)     RDW 17.8 (*)     Lymphs Abs 4.3 (*)     Monocytes Absolute 1.1 (*)     All other components within normal limits  PROTIME-INR  APTT  TYPE AND SCREEN  URINALYSIS, ROUTINE W REFLEX MICROSCOPIC  AMMONIA  TROPONIN I  ABO/RH   Dg Chest 2 View  05/28/2012  *RADIOLOGY REPORT*  Clinical Data: Fall.  Chest pain.  CHEST - 2 VIEW  Comparison: 05/11/2012  Findings: Moderate cardiomegaly.  No pneumothorax.  No consolidation.  No obvious acute bony deformity.  IMPRESSION: Cardiomegaly without decompensation.  Original Report Authenticated By: Donavan Burnet, M.D.   Ct Head Wo Contrast  05/28/2012  *RADIOLOGY REPORT*  Clinical Data: Weakness, fall.  CT HEAD WITHOUT CONTRAST  Technique:  Contiguous axial images were obtained from the base of the skull through the vertex without contrast.  Comparison: 04/02/2012  Findings: Prominence of the sulci, cisterns, and ventricles, in keeping with volume loss. There are subcortical and periventricular white matter hypodensities, a nonspecific finding most often seen with chronic microangiopathic changes.  There is no evidence for acute hemorrhage, overt hydrocephalus, mass lesion, or abnormal extra-axial fluid collection.  No definite CT evidence for acute cortical based (large artery) infarction. Basal ganglia hypodensities may reflect age indeterminate lacunar infarctions. This is a focus on the left centered at the genu of the internal capsule that appears more prominent in the interval (series 2, image 13).  The  visualized paranasal sinuses and mastoid air cells are predominately clear.  No displaced calvarial fracture.  IMPRESSION: Volume loss and mild white matter changes.  Small basal ganglia hypodensities may reflect age indeterminate lacunar infarctions.  No intraparenchymal hemorrhage or cortical based (large artery) infarction.  Original Report Authenticated By: Waneta Martins, M.D.     1. Fall       MDM  The patient has no focal neurologic deficits on exam. She has been able cannulate here in the emergency department and states she is feeling better. There does not appear to be any evidence of injury associated with her recent fall. CT scan shows age-indeterminate infarct but her exam does not suggest an acute stroke.At this time there does not appear to be any evidence of an acute emergency medical condition and the patient appears stable for discharge with appropriate outpatient follow up.         Celene Kras, MD 05/28/12 [phone]

## 2012-05-27 NOTE — ED Notes (Signed)
Pt states she can't walk,  And that she falls when she tries

## 2012-05-27 NOTE — ED Notes (Signed)
Bedside report with previous RN. Pt states that she fell this afternoon and that she has been incontinent and unable to walk without falling since she was released from [facility] three days ago.

## 2012-05-28 LAB — COMPREHENSIVE METABOLIC PANEL
ALT: 19 U/L (ref 0–35)
AST: 17 U/L (ref 0–37)
Albumin: 2.9 g/dL — ABNORMAL LOW (ref 3.5–5.2)
Calcium: 8.7 mg/dL (ref 8.4–10.5)
Chloride: 99 mEq/L (ref 96–112)
Creatinine, Ser: 0.49 mg/dL — ABNORMAL LOW (ref 0.50–1.10)
Sodium: 138 mEq/L (ref 135–145)
Total Bilirubin: 0.1 mg/dL — ABNORMAL LOW (ref 0.3–1.2)

## 2012-05-28 LAB — ABO/RH: ABO/RH(D): B POS

## 2012-05-28 LAB — CBC WITH DIFFERENTIAL/PLATELET
Basophils Relative: 1 % (ref 0–1)
Eosinophils Absolute: 0.4 10*3/uL (ref 0.0–0.7)
MCH: 26.8 pg (ref 26.0–34.0)
MCHC: 32.3 g/dL (ref 30.0–36.0)
Neutrophils Relative %: 53 % (ref 43–77)
Platelets: 374 10*3/uL (ref 150–400)
RDW: 17.8 % — ABNORMAL HIGH (ref 11.5–15.5)

## 2012-05-28 LAB — AMMONIA: Ammonia: 26 umol/L (ref 11–60)

## 2012-05-28 LAB — TYPE AND SCREEN: Antibody Screen: NEGATIVE

## 2012-05-28 LAB — URINALYSIS, ROUTINE W REFLEX MICROSCOPIC
Bilirubin Urine: NEGATIVE
Ketones, ur: NEGATIVE mg/dL
Nitrite: NEGATIVE
Urobilinogen, UA: 0.2 mg/dL (ref 0.0–1.0)

## 2012-05-28 LAB — APTT: aPTT: 26 seconds (ref 24–37)

## 2012-05-28 LAB — PROTIME-INR: INR: 0.91 (ref 0.00–1.49)

## 2012-05-28 NOTE — ED Notes (Signed)
Pt called out to use the restroom. Ambulated there and back with steady gait and in NAD.

## 2012-05-28 NOTE — Progress Notes (Signed)
Abg attempted by 2 RTs but unsuccessful.  Pt refuses any more needle sticks at this time.  RN advised.

## 2012-05-28 NOTE — ED Notes (Signed)
Attempted IV insertion x 4 by two RNs. IV Team notified and is coming to start IV and draw labs.

## 2012-05-28 NOTE — ED Notes (Signed)
Pt states that she is unable to walk. " I cant walk, I dont walk, I came in by EMS".

## 2012-05-29 ENCOUNTER — Encounter: Payer: Self-pay | Admitting: *Deleted

## 2012-05-29 NOTE — Progress Notes (Signed)
Have been trying to get in touch with patient since 05/15/12 and have been unsuccessful.  Mailed a letter to pt and Engineer, water at [employer] to make her aware.

## 2012-06-29 ENCOUNTER — Encounter (HOSPITAL_COMMUNITY): Payer: Self-pay | Admitting: Adult Health

## 2012-06-29 ENCOUNTER — Inpatient Hospital Stay (HOSPITAL_COMMUNITY)
Admission: EM | Admit: 2012-06-29 | Discharge: 2012-07-02 | DRG: 603 | Disposition: A | Discharge status: Home / Self Care | Payer: Medicaid Other | Attending: Internal Medicine | Admitting: Internal Medicine

## 2012-06-29 DIAGNOSIS — L02219 Cutaneous abscess of trunk, unspecified: Principal | ICD-10-CM | POA: Diagnosis present

## 2012-06-29 DIAGNOSIS — J45909 Unspecified asthma, uncomplicated: Secondary | ICD-10-CM | POA: Diagnosis present

## 2012-06-29 DIAGNOSIS — I10 Essential (primary) hypertension: Secondary | ICD-10-CM | POA: Diagnosis present

## 2012-06-29 DIAGNOSIS — J449 Chronic obstructive pulmonary disease, unspecified: Secondary | ICD-10-CM

## 2012-06-29 DIAGNOSIS — Z853 Personal history of malignant neoplasm of breast: Secondary | ICD-10-CM

## 2012-06-29 DIAGNOSIS — F259 Schizoaffective disorder, unspecified: Secondary | ICD-10-CM

## 2012-06-29 DIAGNOSIS — Z9089 Acquired absence of other organs: Secondary | ICD-10-CM

## 2012-06-29 DIAGNOSIS — E119 Type 2 diabetes mellitus without complications: Secondary | ICD-10-CM

## 2012-06-29 DIAGNOSIS — F17200 Nicotine dependence, unspecified, uncomplicated: Secondary | ICD-10-CM | POA: Diagnosis present

## 2012-06-29 DIAGNOSIS — A6000 Herpesviral infection of urogenital system, unspecified: Secondary | ICD-10-CM | POA: Diagnosis present

## 2012-06-29 DIAGNOSIS — E1149 Type 2 diabetes mellitus with other diabetic neurological complication: Secondary | ICD-10-CM | POA: Diagnosis present

## 2012-06-29 DIAGNOSIS — L03319 Cellulitis of trunk, unspecified: Principal | ICD-10-CM | POA: Diagnosis present

## 2012-06-29 DIAGNOSIS — M129 Arthropathy, unspecified: Secondary | ICD-10-CM | POA: Diagnosis present

## 2012-06-29 DIAGNOSIS — F251 Schizoaffective disorder, depressive type: Secondary | ICD-10-CM

## 2012-06-29 DIAGNOSIS — J4489 Other specified chronic obstructive pulmonary disease: Secondary | ICD-10-CM | POA: Diagnosis present

## 2012-06-29 DIAGNOSIS — N390 Urinary tract infection, site not specified: Secondary | ICD-10-CM | POA: Diagnosis present

## 2012-06-29 DIAGNOSIS — L0390 Cellulitis, unspecified: Secondary | ICD-10-CM

## 2012-06-29 DIAGNOSIS — E785 Hyperlipidemia, unspecified: Secondary | ICD-10-CM | POA: Diagnosis present

## 2012-06-29 DIAGNOSIS — Z8673 Personal history of transient ischemic attack (TIA), and cerebral infarction without residual deficits: Secondary | ICD-10-CM

## 2012-06-29 DIAGNOSIS — E1142 Type 2 diabetes mellitus with diabetic polyneuropathy: Secondary | ICD-10-CM | POA: Diagnosis present

## 2012-06-29 DIAGNOSIS — L03311 Cellulitis of abdominal wall: Secondary | ICD-10-CM | POA: Diagnosis present

## 2012-06-29 DIAGNOSIS — Z880 Allergy status to penicillin: Secondary | ICD-10-CM

## 2012-06-29 DIAGNOSIS — F2089 Other schizophrenia: Secondary | ICD-10-CM | POA: Diagnosis present

## 2012-06-29 LAB — CBC
Hemoglobin: 12.4 g/dL (ref 12.0–15.0)
MCV: 83 fL (ref 78.0–100.0)
Platelets: 352 10*3/uL (ref 150–400)
RBC: 4.6 MIL/uL (ref 3.87–5.11)
WBC: 15.4 10*3/uL — ABNORMAL HIGH (ref 4.0–10.5)

## 2012-06-29 LAB — BASIC METABOLIC PANEL
CO2: 29 mEq/L (ref 19–32)
Chloride: 99 mEq/L (ref 96–112)
Creatinine, Ser: 0.68 mg/dL (ref 0.50–1.10)
Glucose, Bld: 135 mg/dL — ABNORMAL HIGH (ref 70–99)

## 2012-06-29 LAB — URINALYSIS, ROUTINE W REFLEX MICROSCOPIC
Bilirubin Urine: NEGATIVE
Glucose, UA: NEGATIVE mg/dL
Hgb urine dipstick: NEGATIVE
Specific Gravity, Urine: 1.011 (ref 1.005–1.030)
pH: 6 (ref 5.0–8.0)

## 2012-06-29 LAB — URINE MICROSCOPIC-ADD ON

## 2012-06-29 MED ORDER — INSULIN GLARGINE 100 UNIT/ML ~~LOC~~ SOLN
30.0000 [IU] | Freq: Every day | SUBCUTANEOUS | Status: DC
  Administered 2012-06-30 – 2012-07-01 (×3): 30 [IU] via SUBCUTANEOUS
  Filled 2012-06-29: qty 1

## 2012-06-29 MED ORDER — VANCOMYCIN HCL IN DEXTROSE 1-5 GM/200ML-% IV SOLN
1000.0000 mg | Freq: Once | INTRAVENOUS | Status: AC
  Administered 2012-06-29: 1000 mg via INTRAVENOUS
  Filled 2012-06-29: qty 200

## 2012-06-29 MED ORDER — LEVOFLOXACIN IN D5W 750 MG/150ML IV SOLN
750.0000 mg | INTRAVENOUS | Status: DC
  Administered 2012-06-29: 750 mg via INTRAVENOUS
  Filled 2012-06-29: qty 150

## 2012-06-29 NOTE — ED Notes (Signed)
C/o urgency, frequency and burning for 4 days associated with lower abdominal pain.

## 2012-06-29 NOTE — ED Provider Notes (Signed)
History     CSN: [id number]  Arrival date & time 06/29/12  1907   First MD Initiated Contact with Patient 06/29/12 2119      Chief Complaint  Patient presents with  . Urinary Tract Infection    (Consider location/radiation/quality/duration/timing/severity/associated sxs/prior treatment) HPI Comments: Patient with a history of diabetes, TIA, schizophrenia, and asthma presents emergency department with a chief complaint of urinary frequency and superficial abdominal skin irritation. Patient arrived via EMS because her boyfriend called for an abnormal odor coming from the pts stomach. Pt currently lives at lives at home. Onset of symptoms began 3 days ago. Pt is a questionable historian d/t baseline psychiatric illness but she denies, fevers, nights sweats, chills, abdominal pain.    Patient is a 61 y.o. female presenting with urinary tract infection. The history is provided by the patient.  Urinary Tract Infection Associated symptoms include a rash. Pertinent negatives include no abdominal pain, chest pain, chills, congestion, fever, headaches, numbness or weakness.    Past Medical History  Diagnosis Date  . Hypertension   . Dysphagia   . TIA (transient ischemic attack)   . Genital herpes   . DM neuropathies   . Anemia   . Arthritis   . Asthma   . Cancer     lt breast  . Schizophrenia, simple   . Diabetes mellitus   . Stroke     Past Surgical History  Procedure Date  . Cholecystectomy   . Cesarean section   . Tonsillectomy     History reviewed. No pertinent family history.  History  Substance Use Topics  . Smoking status: Current Everyday Smoker -- 0.5 packs/day    Types: Cigarettes  . Smokeless tobacco: Never Used  . Alcohol Use: No    OB History    Grav Para Term Preterm Abortions TAB SAB Ect Mult Living                  Review of Systems  Constitutional: Negative for fever, chills and appetite change.  HENT: Negative for congestion.   Eyes: Negative  for visual disturbance.  Respiratory: Negative for shortness of breath.   Cardiovascular: Negative for chest pain and leg swelling.  Gastrointestinal: Negative for abdominal pain.  Genitourinary: Positive for frequency. Negative for dysuria and urgency.  Skin: Positive for color change and rash.  Neurological: Negative for dizziness, syncope, weakness, light-headedness, numbness and headaches.  Psychiatric/Behavioral: Negative for confusion.    Allergies  Penicillins  Home Medications   Current Outpatient Rx  Name Route Sig Dispense Refill  . ALBUTEROL SULFATE HFA 108 (90 BASE) MCG/ACT IN AERS Inhalation Inhale 2 puffs into the lungs every 6 (six) hours as needed. For dyspnea 1 Inhaler 11  . AMLODIPINE BESYLATE 10 MG PO TABS Oral Take 10 mg by mouth daily.    . ATORVASTATIN CALCIUM 10 MG PO TABS Oral Take 1 tablet (10 mg total) by mouth at bedtime. For cholesterol.    . DICLOFENAC SODIUM 75 MG PO TBEC Oral Take 1 tablet (75 mg total) by mouth 2 (two) times daily. For osteoarthritis pain.    Marland Kitchen FLUPHENAZINE HCL 5 MG PO TABS Oral Take 10 mg by mouth at bedtime.    Marland Kitchen FLUTICASONE-SALMETEROL 250-50 MCG/DOSE IN AEPB Inhalation Inhale 1 puff into the lungs 2 (two) times daily. For COPD 60 each 11  . GABAPENTIN 300 MG PO CAPS Oral Take 300 mg by mouth 3 (three) times daily. For anxiety and depression.    Marland Kitchen HYDRALAZINE  HCL 50 MG PO TABS Oral Take 1 tablet (50 mg total) by mouth 3 (three) times daily. For elevated blood pressure. 90 tablet 0  . METFORMIN HCL 500 MG PO TABS Oral Take 1 tablet (500 mg total) by mouth 2 (two) times daily with a meal. For glycemic control.    . TRAZODONE HCL 100 MG PO TABS Oral Take 100 mg by mouth at bedtime.    . TRIHEXYPHENIDYL HCL 2 MG PO TABS Oral Take 0.5 tablets (1 mg total) by mouth at bedtime. For EPS. 15 tablet 0  . FLUPHENAZINE DECANOATE 25 MG/ML IJ SOLN Intramuscular Inject 1 mL (25 mg total) into the muscle every 14 (fourteen) days. For psychosis. 5 mL 0    . INSULIN GLARGINE 100 UNIT/ML [place] SOLN Subcutaneous Inject 30 Units into the skin at bedtime. For glycemic control. 10 mL   . OXYBUTYNIN CHLORIDE ER 10 MG PO TB24 Oral Take 1 tablet (10 mg total) by mouth at bedtime. For overactive bladder. 30 tablet 0  . PANTOPRAZOLE SODIUM 40 MG PO TBEC Oral Take 1 tablet (40 mg total) by mouth daily. For Reflux.    . SERTRALINE HCL 100 MG PO TABS Oral Take 2 tablets (200 mg total) by mouth daily. For depression and anxiety. 60 tablet 0    BP 168/76  Pulse 100  Temp 98.7 F (37.1 C) (Oral)  Resp 18  SpO2 94%  Physical Exam  Constitutional: She is oriented to person, place, and time. She appears well-developed and well-nourished. No distress.  HENT:  Head: Normocephalic and atraumatic.  Mouth/Throat: Oropharynx is clear and moist. No oropharyngeal exudate.  Eyes: Conjunctivae and EOM are normal. Pupils are equal, round, and reactive to light. No scleral icterus.  Neck: Normal range of motion. Neck supple. No tracheal deviation present. No thyromegaly present.  Cardiovascular: Regular rhythm, normal heart sounds and intact distal pulses.        Slightly tachycardic  Pulmonary/Chest: Effort normal and breath sounds normal. No stridor. No respiratory distress. She has no wheezes.  Abdominal: Soft.  Musculoskeletal: Normal range of motion. She exhibits no edema and no tenderness.  Neurological: She is alert and oriented to person, place, and time. Coordination normal.  Skin: Skin is warm and dry. Rash noted. She is not diaphoretic. No erythema. No pallor.          Erythema with ammonia odor and grayish purulent drainage in panis & inguinal folds.   Psychiatric: She has a normal mood and affect. Her behavior is normal.    ED Course  Procedures (including critical care time)  Labs Reviewed  URINALYSIS, ROUTINE W REFLEX MICROSCOPIC - Abnormal; Notable for the following:    APPearance CLOUDY (*)     Leukocytes, UA MODERATE (*)     All other  components within normal limits  URINE MICROSCOPIC-ADD ON - Abnormal; Notable for the following:    Squamous Epithelial / LPF FEW (*)     Bacteria, UA FEW (*)     All other components within normal limits  GLUCOSE, CAPILLARY - Abnormal; Notable for the following:    Glucose-Capillary 152 (*)     All other components within normal limits  URINE CULTURE  CBC  BASIC METABOLIC PANEL   No results found.   No diagnosis found.    MDM  Cellulitis  Pt to be admitted for cellulitis to receive IV abx. She does not qualify for CDU protocol d/t tissue damage w skin sloughing, Inability to care for her self  at home and a history of poorly controlled diabetes. Labs reviewed and pt is with whie count of 15.4 & questionable UTI (culture sent). Vancomycin ordered. The patient appears reasonably stabilized for admission considering the current resources, flow, and capabilities available in the ED at this time, and I doubt any other [facility] requiring further screening and/or treatment in the ED prior to admission.          Jaci Carrel, [place] 06/29/12 2242

## 2012-06-29 NOTE — H&P (Addendum)
CARLO LORSON is an 61 y.o. female.   Patient was seen and examined on June 29, 2012 at 11 PM. PCP - Dr. Tyson Dense. Chief Complaint: Abdominal pain. HPI: 61 year-old female with known history of diabetes mellitus type 2, hypertension, hyperlipidemia, schizophrenia presented to the ER because of worsening abdominal pain. Patient states she has been having abdominal pain for last 3 days and was pointing at the lower part of the abdomen. On exam patient's skin in the lower part abdomen looks macerated and moisty. Patient's groin also has similar appearance. Patient has leukocytosis and at this time is admitted for cellulitis of the abdominal wall. Patient in addition was also complaining of dysuria. Patient otherwise denies any nausea vomiting or diarrhea. Denies any chest pain or shortness of breath.  Past Medical History  Diagnosis Date  . Hypertension   . Dysphagia   . TIA (transient ischemic attack)   . Genital herpes   . DM neuropathies   . Anemia   . Arthritis   . Asthma   . Cancer     lt breast  . Schizophrenia, simple   . Diabetes mellitus   . Stroke     Past Surgical History  Procedure Date  . Cholecystectomy   . Cesarean section   . Tonsillectomy     History reviewed. No pertinent family history. Social History:  reports that she has been smoking Cigarettes.  She has been smoking about .5 packs per day. She has never used smokeless tobacco. She reports that she does not drink alcohol or use illicit drugs.  Allergies:  Allergies  Allergen Reactions  . Penicillins Other (See Comments)    unknown     (Not in a hospital admission)  Results for orders placed during the hospital encounter of 06/29/12 (from the past 48 hour(s))  URINALYSIS, ROUTINE W REFLEX MICROSCOPIC     Status: Abnormal   Collection Time   06/29/12  7:42 PM      Component Value Range Comment   Color, Urine YELLOW  YELLOW    APPearance CLOUDY (*) CLEAR    Specific Gravity, Urine 1.011  1.005 -  1.030    pH 6.0  5.0 - 8.0    Glucose, UA NEGATIVE  NEGATIVE mg/dL    Hgb urine dipstick NEGATIVE  NEGATIVE    Bilirubin Urine NEGATIVE  NEGATIVE    Ketones, ur NEGATIVE  NEGATIVE mg/dL    Protein, ur NEGATIVE  NEGATIVE mg/dL    Urobilinogen, UA 1.0  0.0 - 1.0 mg/dL    Nitrite NEGATIVE  NEGATIVE    Leukocytes, UA MODERATE (*) NEGATIVE   URINE MICROSCOPIC-ADD ON     Status: Abnormal   Collection Time   06/29/12  7:42 PM      Component Value Range Comment   Squamous Epithelial / LPF FEW (*) RARE    WBC, UA 3-6  <3 WBC/hpf    Bacteria, UA FEW (*) RARE    Urine-Other MUCOUS PRESENT     GLUCOSE, CAPILLARY     Status: Abnormal   Collection Time   06/29/12  8:45 PM      Component Value Range Comment   Glucose-Capillary 152 (*) 70 - 99 mg/dL   CBC     Status: Abnormal   Collection Time   06/29/12  9:49 PM      Component Value Range Comment   WBC 15.4 (*) 4.0 - 10.5 K/uL    RBC 4.60  3.87 - 5.11 MIL/uL  Hemoglobin 12.4  12.0 - 15.0 g/dL    HCT 40.9  81.1 - 91.4 %    MCV 83.0  78.0 - 100.0 fL    MCH 27.0  26.0 - 34.0 pg    MCHC 32.5  30.0 - 36.0 g/dL    RDW 78.2 (*) 95.6 - 15.5 %    Platelets 352  150 - 400 K/uL   BASIC METABOLIC PANEL     Status: Abnormal   Collection Time   06/29/12  9:49 PM      Component Value Range Comment   Sodium 138  135 - 145 mEq/L    Potassium 3.7  3.5 - 5.1 mEq/L    Chloride 99  96 - 112 mEq/L    CO2 29  19 - 32 mEq/L    Glucose, Bld 135 (*) 70 - 99 mg/dL    BUN 15  6 - 23 mg/dL    Creatinine, Ser 2.13  0.50 - 1.10 mg/dL    Calcium 9.8  8.4 - 08.6 mg/dL    GFR calc non Af Amer >90  >90 mL/min    GFR calc Af Amer >90  >90 mL/min    No results found.  Review of Systems  Constitutional: Negative.   HENT: Negative.   Eyes: Negative.   Respiratory: Negative.   Cardiovascular: Negative.   Gastrointestinal: Positive for abdominal pain.  Genitourinary: Positive for dysuria.  Musculoskeletal: Negative.   Skin: Negative.   Neurological: Negative.     Endo/Heme/Allergies: Negative.   Psychiatric/Behavioral: Negative.     Blood pressure 148/90, pulse 84, temperature 98.7 F (37.1 C), temperature source Oral, resp. rate 16, SpO2 94.00%. Physical Exam  Constitutional: She is oriented to person, place, and time. She appears well-developed and well-nourished. No distress.  HENT:  Head: Normocephalic and atraumatic.  Right Ear: External ear normal.  Left Ear: External ear normal.  Nose: Nose normal.  Mouth/Throat: Oropharynx is clear and moist. No oropharyngeal exudate.  Eyes: Conjunctivae are normal. Pupils are equal, round, and reactive to light. Right eye exhibits no discharge. Left eye exhibits no discharge. No scleral icterus.  Neck: Normal range of motion. Neck supple.  Cardiovascular: Normal rate and regular rhythm.   Respiratory: Effort normal and breath sounds normal. No respiratory distress. She has no wheezes. She has no rales.  GI: Soft. Bowel sounds are normal. She exhibits no distension. There is no tenderness. There is no guarding.       Skin in the lower part of the abdominal wall looks macerated and moist. Also the groin area.  Musculoskeletal: Normal range of motion. She exhibits no edema and no tenderness.  Neurological: She is alert and oriented to person, place, and time.       Moves all extremities.  Skin: She is not diaphoretic.     Assessment/Plan #1. Cellulitis involving the lower abdominal wall and groin - patient has been started empiric IV antibiotics including vancomycin and Cipro. Closely follow clinically.Patients abdomen is not tender on exam. #2. Dysuria with possible UTI - urine cultures have been ordered. Patient is on Cipro. #3. Diabetes mellitus 2 - continue home medications with sliding-scale coverage and check hemoglobin A1c. #4. History of COPD/asthma with ongoing tobacco abuse - presently not wheezing. Continue home medications. Patient advised to quit smoking. #5. Hypertension hyperlipidemia -  continue home medications. #6. Schizophrenia - continue home medications.  CODE STATUS - full code.   Lannis Lichtenwalner N. 06/29/2012, 11:14 PM

## 2012-06-30 DIAGNOSIS — J449 Chronic obstructive pulmonary disease, unspecified: Secondary | ICD-10-CM

## 2012-06-30 LAB — COMPREHENSIVE METABOLIC PANEL
ALT: 13 U/L (ref 0–35)
AST: 12 U/L (ref 0–37)
Albumin: 3 g/dL — ABNORMAL LOW (ref 3.5–5.2)
Alkaline Phosphatase: 79 U/L (ref 39–117)
BUN: 12 mg/dL (ref 6–23)
Chloride: 99 mEq/L (ref 96–112)
Potassium: 3.5 mEq/L (ref 3.5–5.1)
Sodium: 138 mEq/L (ref 135–145)
Total Bilirubin: 0.2 mg/dL — ABNORMAL LOW (ref 0.3–1.2)
Total Protein: 6.9 g/dL (ref 6.0–8.3)

## 2012-06-30 LAB — URINE CULTURE: Colony Count: >=100000

## 2012-06-30 LAB — CBC WITH DIFFERENTIAL/PLATELET
Basophils Absolute: 0 10*3/uL (ref 0.0–0.1)
Basophils Relative: 0 % (ref 0–1)
Eosinophils Absolute: 0.3 10*3/uL (ref 0.0–0.7)
Eosinophils Relative: 3 % (ref 0–5)
HCT: 36.7 % (ref 36.0–46.0)
Lymphocytes Relative: 26 % (ref 12–46)
MCH: 26.1 pg (ref 26.0–34.0)
MCHC: 31.3 g/dL (ref 30.0–36.0)
MCV: 83.2 fL (ref 78.0–100.0)
Monocytes Absolute: 0.9 10*3/uL (ref 0.1–1.0)
Platelets: 348 10*3/uL (ref 150–400)
RDW: 17.7 % — ABNORMAL HIGH (ref 11.5–15.5)
WBC: 11.8 10*3/uL — ABNORMAL HIGH (ref 4.0–10.5)

## 2012-06-30 LAB — GLUCOSE, CAPILLARY
Glucose-Capillary: 118 mg/dL — ABNORMAL HIGH (ref 70–99)
Glucose-Capillary: 213 mg/dL — ABNORMAL HIGH (ref 70–99)

## 2012-06-30 LAB — TSH: TSH: 1.346 u[IU]/mL (ref 0.350–4.500)

## 2012-06-30 MED ORDER — ACETAMINOPHEN 650 MG RE SUPP: 650.0000 mg | Freq: Four times a day (QID) | RECTAL | Status: DC | PRN

## 2012-06-30 MED ORDER — TRAZODONE HCL 100 MG PO TABS
100.0000 mg | ORAL_TABLET | Freq: Every day | ORAL | Status: DC
  Administered 2012-06-30 – 2012-07-01 (×2): 100 mg via ORAL
  Filled 2012-06-30 (×4): qty 1

## 2012-06-30 MED ORDER — FLUTICASONE-SALMETEROL 250-50 MCG/DOSE IN AEPB
1.0000 | INHALATION_SPRAY | Freq: Two times a day (BID) | RESPIRATORY_TRACT | Status: DC
  Administered 2012-06-30 – 2012-07-02 (×4): 1 via RESPIRATORY_TRACT
  Filled 2012-06-30: qty 14

## 2012-06-30 MED ORDER — TRIHEXYPHENIDYL HCL 2 MG PO TABS
1.0000 mg | ORAL_TABLET | Freq: Every day | ORAL | Status: DC
  Administered 2012-06-30 – 2012-07-01 (×3): 1 mg via ORAL
  Filled 2012-06-30 (×4): qty 1

## 2012-06-30 MED ORDER — GABAPENTIN 300 MG PO CAPS
300.0000 mg | ORAL_CAPSULE | Freq: Three times a day (TID) | ORAL | Status: DC
  Administered 2012-06-30 – 2012-07-02 (×9): 300 mg via ORAL
  Filled 2012-06-30 (×10): qty 1

## 2012-06-30 MED ORDER — OXYBUTYNIN CHLORIDE ER 10 MG PO TB24
10.0000 mg | ORAL_TABLET | Freq: Every day | ORAL | Status: DC
  Administered 2012-06-30 – 2012-07-01 (×3): 10 mg via ORAL
  Filled 2012-06-30 (×4): qty 1

## 2012-06-30 MED ORDER — ALBUTEROL SULFATE HFA 108 (90 BASE) MCG/ACT IN AERS: 2.0000 | INHALATION_SPRAY | Freq: Four times a day (QID) | RESPIRATORY_TRACT | Status: DC | PRN

## 2012-06-30 MED ORDER — FLUPHENAZINE DECANOATE 25 MG/ML IJ SOLN: 25.0000 mg | INTRAMUSCULAR | Status: DC

## 2012-06-30 MED ORDER — ENOXAPARIN SODIUM 40 MG/0.4ML ~~LOC~~ SOLN
40.0000 mg | SUBCUTANEOUS | Status: DC
  Administered 2012-06-30 – 2012-07-01 (×2): 40 mg via SUBCUTANEOUS
  Filled 2012-06-30 (×3): qty 0.4

## 2012-06-30 MED ORDER — ACETAMINOPHEN 325 MG PO TABS
650.0000 mg | ORAL_TABLET | Freq: Four times a day (QID) | ORAL | Status: DC | PRN
  Administered 2012-06-30 – 2012-07-02 (×3): 650 mg via ORAL
  Filled 2012-06-30 (×2): qty 2

## 2012-06-30 MED ORDER — ATORVASTATIN CALCIUM 10 MG PO TABS
10.0000 mg | ORAL_TABLET | Freq: Every day | ORAL | Status: DC
  Administered 2012-06-30 – 2012-07-01 (×3): 10 mg via ORAL
  Filled 2012-06-30 (×4): qty 1

## 2012-06-30 MED ORDER — INSULIN ASPART 100 UNIT/ML ~~LOC~~ SOLN
0.0000 [IU] | Freq: Three times a day (TID) | SUBCUTANEOUS | Status: DC
  Administered 2012-06-30 (×2): 2 [IU] via SUBCUTANEOUS
  Administered 2012-07-01 (×2): 1 [IU] via SUBCUTANEOUS
  Administered 2012-07-02: 2 [IU] via SUBCUTANEOUS
  Administered 2012-07-02: 1 [IU] via SUBCUTANEOUS

## 2012-06-30 MED ORDER — PANTOPRAZOLE SODIUM 40 MG PO TBEC
40.0000 mg | DELAYED_RELEASE_TABLET | Freq: Every day | ORAL | Status: DC
  Administered 2012-06-30 – 2012-07-02 (×3): 40 mg via ORAL
  Filled 2012-06-30 (×3): qty 1

## 2012-06-30 MED ORDER — FLUPHENAZINE HCL 10 MG PO TABS
10.0000 mg | ORAL_TABLET | Freq: Every day | ORAL | Status: DC
  Administered 2012-06-30 – 2012-07-01 (×3): 10 mg via ORAL
  Filled 2012-06-30 (×4): qty 1

## 2012-06-30 MED ORDER — ONDANSETRON HCL 4 MG PO TABS: 4.0000 mg | ORAL_TABLET | Freq: Four times a day (QID) | ORAL | Status: DC | PRN

## 2012-06-30 MED ORDER — ONDANSETRON HCL 4 MG/2ML IJ SOLN: 4.0000 mg | Freq: Four times a day (QID) | INTRAMUSCULAR | Status: DC | PRN

## 2012-06-30 MED ORDER — HYDRALAZINE HCL 50 MG PO TABS
50.0000 mg | ORAL_TABLET | Freq: Three times a day (TID) | ORAL | Status: DC
  Administered 2012-06-30 – 2012-07-02 (×9): 50 mg via ORAL
  Filled 2012-06-30 (×11): qty 1

## 2012-06-30 MED ORDER — SODIUM CHLORIDE 0.9 % IV SOLN: INTRAVENOUS | Status: DC

## 2012-06-30 MED ORDER — SERTRALINE HCL 100 MG PO TABS
200.0000 mg | ORAL_TABLET | Freq: Every day | ORAL | Status: DC
  Administered 2012-06-30 – 2012-07-02 (×3): 200 mg via ORAL
  Filled 2012-06-30 (×3): qty 2

## 2012-06-30 MED ORDER — AMLODIPINE BESYLATE 10 MG PO TABS
10.0000 mg | ORAL_TABLET | Freq: Every day | ORAL | Status: DC
  Administered 2012-06-30 – 2012-07-02 (×3): 10 mg via ORAL
  Filled 2012-06-30 (×3): qty 1

## 2012-06-30 MED ORDER — VANCOMYCIN HCL 1000 MG IV SOLR
1250.0000 mg | Freq: Two times a day (BID) | INTRAVENOUS | Status: DC
  Administered 2012-06-30 – 2012-07-02 (×5): 1250 mg via INTRAVENOUS
  Filled 2012-06-30 (×7): qty 1250

## 2012-06-30 MED ORDER — SODIUM CHLORIDE 0.9 % IJ SOLN
3.0000 mL | Freq: Two times a day (BID) | INTRAMUSCULAR | Status: DC
  Administered 2012-06-30 – 2012-07-01 (×3): 3 mL via INTRAVENOUS

## 2012-06-30 MED ORDER — SODIUM CHLORIDE 0.9 % IV SOLN
INTRAVENOUS | Status: AC
  Administered 2012-06-30: 02:00:00 via INTRAVENOUS

## 2012-06-30 MED ORDER — CIPROFLOXACIN IN D5W 400 MG/200ML IV SOLN
400.0000 mg | Freq: Two times a day (BID) | INTRAVENOUS | Status: AC
  Administered 2012-06-30 (×2): 400 mg via INTRAVENOUS
  Filled 2012-06-30 (×4): qty 200

## 2012-06-30 NOTE — Progress Notes (Signed)
ANTIBIOTIC CONSULT NOTE - INITIAL  Pharmacy Consult for vancomycin/ciprofloxacin Indication: cellulitis and possible UTI  Allergies  Allergen Reactions  . Penicillins Other (See Comments)    unknown    Patient Measurements: Height: 5\' 3"  (160 cm) Weight: 208 lb 1.8 oz (94.4 kg) IBW/kg (Calculated) : 52.4   Vital Signs: Temp: 98.6 F (37 C) (08/30 0045) Temp src: Oral (08/30 0045) BP: 168/55 mmHg (08/30 0045) Pulse Rate: 88  (08/30 0045)  Labs:  [facility] 06/29/12 2149  WBC 15.4*  HGB 12.4  PLT 352  LABCREA --  CREATININE 0.68   Estimated Creatinine Clearance: 81.7 ml/min (by C-G formula based on Cr of 0.68).  Microbiology: No results found for this or any previous visit (from the past 720 hour(s)).  Medical History: Past Medical History  Diagnosis Date  . Hypertension   . Dysphagia   . TIA (transient ischemic attack)   . Genital herpes   . DM neuropathies   . Anemia   . Arthritis   . Asthma   . Cancer     lt breast  . Schizophrenia, simple   . Diabetes mellitus   . Stroke     Medications:  Prescriptions prior to admission  Medication Sig Dispense Refill  . albuterol (PROVENTIL HFA;VENTOLIN HFA) 108 (90 BASE) MCG/ACT inhaler Inhale 2 puffs into the lungs every 6 (six) hours as needed. For dyspnea  1 Inhaler  11  . amLODipine (NORVASC) 10 MG tablet Take 10 mg by mouth daily.      Marland Kitchen atorvastatin (LIPITOR) 10 MG tablet Take 1 tablet (10 mg total) by mouth at bedtime. For cholesterol.      . diclofenac (VOLTAREN) 75 MG EC tablet Take 1 tablet (75 mg total) by mouth 2 (two) times daily. For osteoarthritis pain.      . fluPHENAZine (PROLIXIN) 5 MG tablet Take 10 mg by mouth at bedtime.      . Fluticasone-Salmeterol (ADVAIR DISKUS) 250-50 MCG/DOSE AEPB Inhale 1 puff into the lungs 2 (two) times daily. For COPD  60 each  11  . gabapentin (NEURONTIN) 300 MG capsule Take 300 mg by mouth 3 (three) times daily. For anxiety and depression.       . hydrALAZINE  (APRESOLINE) 50 MG tablet Take 1 tablet (50 mg total) by mouth 3 (three) times daily. For elevated blood pressure.  90 tablet  0  . metFORMIN (GLUCOPHAGE) 500 MG tablet Take 1 tablet (500 mg total) by mouth 2 (two) times daily with a meal. For glycemic control.      . traZODone (DESYREL) 100 MG tablet Take 100 mg by mouth at bedtime.      . trihexyphenidyl (ARTANE) 2 MG tablet Take 0.5 tablets (1 mg total) by mouth at bedtime. For EPS.  15 tablet  0  . fluPHENAZine decanoate (PROLIXIN) 25 MG/ML injection Inject 1 mL (25 mg total) into the muscle every 14 (fourteen) days. For psychosis.  5 mL  0  . insulin glargine (LANTUS) 100 UNIT/ML injection Inject 30 Units into the skin at bedtime. For glycemic control.  10 mL    . oxybutynin (DITROPAN-XL) 10 MG 24 hr tablet Take 1 tablet (10 mg total) by mouth at bedtime. For overactive bladder.  30 tablet  0  . pantoprazole (PROTONIX) 40 MG tablet Take 1 tablet (40 mg total) by mouth daily. For Reflux.      . sertraline (ZOLOFT) 100 MG tablet Take 2 tablets (200 mg total) by mouth daily. For depression and anxiety.  60 tablet  0   Scheduled:    . sodium chloride   Intravenous STAT  . amLODipine  10 mg Oral Daily  . atorvastatin  10 mg Oral QHS  . ciprofloxacin  400 mg Intravenous Q12H  . enoxaparin (LOVENOX) injection  40 mg Subcutaneous Q24H  . fluPHENAZine  10 mg Oral QHS  . fluPHENAZine decanoate  25 mg Intramuscular Q14 Days  . Fluticasone-Salmeterol  1 puff Inhalation BID  . gabapentin  300 mg Oral TID  . hydrALAZINE  50 mg Oral TID  . insulin aspart  0-9 Units Subcutaneous TID WC  . insulin glargine  30 Units Subcutaneous QHS  . oxybutynin  10 mg Oral QHS  . pantoprazole  40 mg Oral Daily  . sertraline  200 mg Oral Daily  . sodium chloride  3 mL Intravenous Q12H  . traZODone  100 mg Oral QHS  . trihexyphenidyl  1 mg Oral QHS  . vancomycin  1,250 mg Intravenous Q12H  . vancomycin  1,000 mg Intravenous Once  . DISCONTD: levofloxacin  (LEVAQUIN) IV  750 mg Intravenous Q24H    Assessment: 61yo female who cannot take good care of herself at home c/o urinary frequency and odorous superficial abdominal skin infection with grayish purulent drainage, to begin IV ABX for cellulitis and possible UTI.  Goal of Therapy:  Vancomycin trough level 10-15 mcg/ml  Plan:  Rec;d vanc 1g and Levaquin 750mg  IV in ED; will continue with vancomycin 1250mg  IV Q12H and Cipro 400mg  IV Q12H and monitor CBC, Cx, levels prn.  Colleen Can PharmD BCPS 06/30/2012,1:27 AM

## 2012-06-30 NOTE — Progress Notes (Addendum)
[facility]             Progress Note   Subjective: Feels better, pain improved  Objective: Vital signs in last 24 hours: Temp:  [98.3 F (36.8 C)-98.7 F (37.1 C)] 98.3 F (36.8 C) (08/30 0454) Pulse Rate:  [83-100] 83  (08/30 0454) Resp:  [16-20] 20  (08/30 0454) BP: (148-168)/(55-90) 154/75 mmHg (08/30 0454) SpO2:  [94 %-98 %] 98 % (08/30 0751) Weight:  [94.4 kg (208 lb 1.8 oz)] 94.4 kg (208 lb 1.8 oz) (08/30 0045) Weight change:     Intake/Output from previous day: 08/29 0701 - 08/30 0700 In: 428 [P.O.:90; I.V.:338] Out: 475 [Urine:475]     Physical Exam: Constitutional: She is oriented to person, place, and time. She appears well-developed and well-nourished. No distress.  HENT:  Head: Normocephalic and atraumatic.  Right Ear: External ear normal.  Left Ear: External ear normal.  Nose: Nose normal.  Mouth/Throat: Oropharynx is clear and moist. No oropharyngeal exudate.  Eyes: Conjunctivae are normal. Pupils are equal, round, and reactive to light. Right eye exhibits no discharge. Left eye exhibits no discharge. No scleral icterus.  Neck: Normal range of motion. Neck supple.  Cardiovascular: Normal rate and regular rhythm.  Respiratory: Effort normal and breath sounds normal. No respiratory distress. She has no wheezes. She has no rales.  GI: Soft. Bowel sounds are normal. She exhibits no distension. There is no tenderness. There is no guarding.  Skin in the lower part of the abdominal wall looks macerated and moist. Also the groin area.  Musculoskeletal: Normal range of motion. She exhibits no edema and no tenderness.  Neurological: She is alert and oriented to person, place, and time.  Moves all extremities.  Skin: She is not diaphoretic.    Lab Results: Basic Metabolic Panel:  Basename 06/30/12 0553 06/29/12 2149  NA 138 138  K 3.5 3.7  CL 99 99  CO2 27 29  GLUCOSE 134* 135*  BUN 12 15  CREATININE 0.57 0.68  CALCIUM 9.2 9.8  MG -- --    PHOS -- --   Liver Function Tests:  Basename 06/30/12 0553  AST 12  ALT 13  ALKPHOS 79  BILITOT 0.2*  PROT 6.9  ALBUMIN 3.0*   No results found for this basename: LIPASE:2,AMYLASE:2 in the last 72 hours No results found for this basename: AMMONIA:2 in the last 72 hours CBC:  Basename 06/30/12 0553 06/29/12 2149  WBC 11.8* 15.4*  NEUTROABS 7.5 --  HGB 11.5* 12.4  HCT 36.7 38.2  MCV 83.2 83.0  PLT 348 352   Cardiac Enzymes: No results found for this basename: CKTOTAL:3,CKMB:3,CKMBINDEX:3,TROPONINI:3 in the last 72 hours BNP: No results found for this basename: PROBNP:3 in the last 72 hours D-Dimer: No results found for this basename: DDIMER:2 in the last 72 hours CBG:  Basename 06/30/12 0757 06/29/12 2358 06/29/12 2045  GLUCAP 118* 202* 152*   Hemoglobin A1C: No results found for this basename: HGBA1C in the last 72 hours Fasting Lipid Panel: No results found for this basename: CHOL,HDL,LDLCALC,TRIG,CHOLHDL,LDLDIRECT in the last 72 hours Thyroid Function Tests: No results found for this basename: TSH,T4TOTAL,FREET4,T3FREE,THYROIDAB in the last 72 hours Anemia Panel: No results found for this basename: VITAMINB12,FOLATE,FERRITIN,TIBC,IRON,RETICCTPCT in the last 72 hours Coagulation: No results found for this basename: LABPROT:2,INR:2 in the last 72 hours Urine Drug Screen: Drugs of Abuse     Component Value Date/Time   LABOPIA NONE DETECTED 05/11/2012 1640   COCAINSCRNUR NONE DETECTED 05/11/2012 1640   LABBENZ  NONE DETECTED 05/11/2012 1640   AMPHETMU NONE DETECTED 05/11/2012 1640   THCU NONE DETECTED 05/11/2012 1640   LABBARB NONE DETECTED 05/11/2012 1640    Alcohol Level: No results found for this basename: ETH:2 in the last 72 hours Urinalysis:  Basename 06/29/12 1942  COLORURINE YELLOW  LABSPEC 1.011  PHURINE 6.0  GLUCOSEU NEGATIVE  HGBUR NEGATIVE  BILIRUBINUR NEGATIVE  KETONESUR NEGATIVE  PROTEINUR NEGATIVE  UROBILINOGEN 1.0  NITRITE NEGATIVE   LEUKOCYTESUR MODERATE*    No results found for this or any previous visit (from the past 240 hour(s)).  Studies/Results: No results found.  Medications: Scheduled Meds:   . amLODipine  10 mg Oral Daily  . atorvastatin  10 mg Oral QHS  . ciprofloxacin  400 mg Intravenous Q12H  . enoxaparin (LOVENOX) injection  40 mg Subcutaneous Q24H  . fluPHENAZine  10 mg Oral QHS  . fluPHENAZine decanoate  25 mg Intramuscular Q14 Days  . Fluticasone-Salmeterol  1 puff Inhalation BID  . gabapentin  300 mg Oral TID  . hydrALAZINE  50 mg Oral TID  . insulin aspart  0-9 Units Subcutaneous TID WC  . insulin glargine  30 Units Subcutaneous QHS  . oxybutynin  10 mg Oral QHS  . pantoprazole  40 mg Oral Q1200  . sertraline  200 mg Oral Daily  . sodium chloride  3 mL Intravenous Q12H  . traZODone  100 mg Oral QHS  . trihexyphenidyl  1 mg Oral QHS  . vancomycin  1,250 mg Intravenous Q12H  . vancomycin  1,000 mg Intravenous Once  . DISCONTD: sodium chloride   Intravenous STAT  . DISCONTD: levofloxacin (LEVAQUIN) IV  750 mg Intravenous Q24H   Continuous Infusions:   . sodium chloride 75 mL/hr at 06/30/12 0228   PRN Meds:.acetaminophen, acetaminophen, albuterol, ondansetron (ZOFRAN) IV, ondansetron  Assessment/Plan:  #1. Cellulitis involving the lower abdominal wall and groin - patient has been started empiric IV antibiotics including vancomycin and Cipro. clinically improving Keep area dry and clean #2. Dysuria with possible UTI - urine cultures have been ordered. Patient is on Cipro.  #3. Diabetes mellitus 2 - continue lantus, increase dose with sliding-scale coverage and check hemoglobin A1c.  #4. History of COPD/asthma with ongoing tobacco abuse - stable,  Continue advair and albuterol PRN,  Patient advised to quit smoking.  #5. Hypertension  continue home medications.  #6. Schizophrenia - continue flufenazine and trihexyphenidyl.  DIspo: home in 1-2 days FULL CODE  LOS: 1 day    [facility] Pager: [phone] 06/30/2012, 10:09 AM

## 2012-06-30 NOTE — Progress Notes (Signed)
Initial review for inpatient status is complete. 

## 2012-06-30 NOTE — ED Notes (Signed)
REPORT GIVEN TO FLOOR NURSE , TRANSPORTED BY NT IN STABLE CONDITION .

## 2012-07-01 DIAGNOSIS — L02219 Cutaneous abscess of trunk, unspecified: Secondary | ICD-10-CM

## 2012-07-01 DIAGNOSIS — L03319 Cellulitis of trunk, unspecified: Secondary | ICD-10-CM

## 2012-07-01 LAB — GLUCOSE, CAPILLARY
Glucose-Capillary: 121 mg/dL — ABNORMAL HIGH (ref 70–99)
Glucose-Capillary: 111 mg/dL — ABNORMAL HIGH (ref 70–99)
Glucose-Capillary: 172 mg/dL — ABNORMAL HIGH (ref 70–99)

## 2012-07-01 MED ORDER — HYDRALAZINE HCL 20 MG/ML IJ SOLN
10.0000 mg | Freq: Four times a day (QID) | INTRAMUSCULAR | Status: DC | PRN
  Administered 2012-07-02: 10 mg via INTRAVENOUS
  Filled 2012-07-01: qty 0.5

## 2012-07-01 MED ORDER — CIPROFLOXACIN HCL 500 MG PO TABS
500.0000 mg | ORAL_TABLET | Freq: Two times a day (BID) | ORAL | Status: DC
  Administered 2012-07-01 – 2012-07-02 (×3): 500 mg via ORAL
  Filled 2012-07-01 (×5): qty 1

## 2012-07-01 NOTE — Progress Notes (Signed)
[facility]             Progress Note   Subjective: Feels better, pain improved  Objective: Vital signs in last 24 hours: Temp:  [98.4 F (36.9 C)-98.7 F (37.1 C)] 98.7 F (37.1 C) (08/31 0530) Pulse Rate:  [73-86] 73  (08/31 0530) Resp:  [18-20] 20  (08/31 0530) BP: (137-189)/(67-90) 187/75 mmHg (08/31 1040) SpO2:  [95 %-96 %] 96 % (08/31 0831) Weight:  [96.8 kg (213 lb 6.5 oz)] 96.8 kg (213 lb 6.5 oz) (08/31 0458) Weight change: 2.4 kg (5 lb 4.7 oz) Last BM Date: 06/30/12  Intake/Output from previous day: 08/30 0701 - 08/31 0700 In: 1658 [P.O.:680; I.V.:978] Out: 950 [Urine:950] Total I/O In: 240 [P.O.:240] Out: -    Physical Exam: Constitutional: She is oriented to person, place, and time. She appears well-developed and well-nourished. No distress.  HENT:  Head: Normocephalic and atraumatic.  Right Ear: External ear normal.  Left Ear: External ear normal.  Nose: Nose normal.  Mouth/Throat: Oropharynx is clear and moist. No oropharyngeal exudate.  Eyes: Conjunctivae are normal. Pupils are equal, round, and reactive to light. Right eye exhibits no discharge. Left eye exhibits no discharge. No scleral icterus.  Neck: Normal range of motion. Neck supple.  Cardiovascular: Normal rate and regular rhythm.  Respiratory: Effort normal and breath sounds normal. No respiratory distress. She has no wheezes. She has no rales.  GI: Soft. Bowel sounds are normal. She exhibits no distension. There is no tenderness. There is no guarding.  Skin in the lower part of the abdominal wall looks macerated and moist. Also the groin area, less erythema and induration of abd wall today Musculoskeletal: Normal range of motion. She exhibits no edema and no tenderness.  Neurological: She is alert and oriented to person, place, and time.  Moves all extremities.  Skin: She is not diaphoretic.    Lab Results: Basic Metabolic Panel:  Basename 06/30/12 0553 06/29/12 2149  NA  138 138  K 3.5 3.7  CL 99 99  CO2 27 29  GLUCOSE 134* 135*  BUN 12 15  CREATININE 0.57 0.68  CALCIUM 9.2 9.8  MG -- --  PHOS -- --   Liver Function Tests:  Basename 06/30/12 0553  AST 12  ALT 13  ALKPHOS 79  BILITOT 0.2*  PROT 6.9  ALBUMIN 3.0*   No results found for this basename: LIPASE:2,AMYLASE:2 in the last 72 hours No results found for this basename: AMMONIA:2 in the last 72 hours CBC:  Basename 06/30/12 0553 06/29/12 2149  WBC 11.8* 15.4*  NEUTROABS 7.5 --  HGB 11.5* 12.4  HCT 36.7 38.2  MCV 83.2 83.0  PLT 348 352   Cardiac Enzymes: No results found for this basename: CKTOTAL:3,CKMB:3,CKMBINDEX:3,TROPONINI:3 in the last 72 hours BNP: No results found for this basename: PROBNP:3 in the last 72 hours D-Dimer: No results found for this basename: DDIMER:2 in the last 72 hours CBG:  Basename 06/30/12 2112 06/30/12 1637 06/30/12 1159 06/30/12 0757 06/29/12 2358 06/29/12 2045  GLUCAP 213* 156* 159* 118* 202* 152*   Hemoglobin A1C:  Basename 06/30/12 0553  HGBA1C 6.8*   Fasting Lipid Panel: No results found for this basename: CHOL,HDL,LDLCALC,TRIG,CHOLHDL,LDLDIRECT in the last 72 hours Thyroid Function Tests:  Basename 06/30/12 0553  TSH 1.346  T4TOTAL --  Domingo Cocking --  T3FREE --  THYROIDAB --   Anemia Panel: No results found for this basename: VITAMINB12,FOLATE,FERRITIN,TIBC,IRON,RETICCTPCT in the last 72 hours Coagulation: No results found for this basename: LABPROT:2,INR:2 in  the last 72 hours Urine Drug Screen: Drugs of Abuse     Component Value Date/Time   LABOPIA NONE DETECTED 05/11/2012 1640   COCAINSCRNUR NONE DETECTED 05/11/2012 1640   LABBENZ NONE DETECTED 05/11/2012 1640   AMPHETMU NONE DETECTED 05/11/2012 1640   THCU NONE DETECTED 05/11/2012 1640   LABBARB NONE DETECTED 05/11/2012 1640    Alcohol Level: No results found for this basename: ETH:2 in the last 72 hours Urinalysis:  Basename 06/29/12 1942  COLORURINE YELLOW  LABSPEC 1.011    PHURINE 6.0  GLUCOSEU NEGATIVE  HGBUR NEGATIVE  BILIRUBINUR NEGATIVE  KETONESUR NEGATIVE  PROTEINUR NEGATIVE  UROBILINOGEN 1.0  NITRITE NEGATIVE  LEUKOCYTESUR MODERATE*    Recent Results (from the past 240 hour(s))  URINE CULTURE     Status: Normal   Collection Time   06/29/12  7:42 PM      Component Value Range Status Comment   Specimen Description URINE, CLEAN CATCH   Final    Special Requests NONE   Final    Culture  Setup Time 06/29/2012 20:54   Final    Colony Count >=100,000 COLONIES/ML   Final    Culture     Final    Value: Multiple bacterial morphotypes present, none predominant. Suggest appropriate recollection if clinically indicated.   Report Status 06/30/2012 FINAL   Final     Studies/Results: No results found.  Medications: Scheduled Meds:    . amLODipine  10 mg Oral Daily  . atorvastatin  10 mg Oral QHS  . ciprofloxacin  400 mg Intravenous Q12H  . ciprofloxacin  500 mg Oral BID  . enoxaparin (LOVENOX) injection  40 mg Subcutaneous Q24H  . fluPHENAZine  10 mg Oral QHS  . fluPHENAZine decanoate  25 mg Intramuscular Q14 Days  . Fluticasone-Salmeterol  1 puff Inhalation BID  . gabapentin  300 mg Oral TID  . hydrALAZINE  50 mg Oral TID  . insulin aspart  0-9 Units Subcutaneous TID WC  . insulin glargine  30 Units Subcutaneous QHS  . oxybutynin  10 mg Oral QHS  . pantoprazole  40 mg Oral Q1200  . sertraline  200 mg Oral Daily  . sodium chloride  3 mL Intravenous Q12H  . traZODone  100 mg Oral QHS  . trihexyphenidyl  1 mg Oral QHS  . vancomycin  1,250 mg Intravenous Q12H   Continuous Infusions:    . sodium chloride 75 mL/hr at 06/30/12 0228   PRN Meds:.acetaminophen, acetaminophen, albuterol, hydrALAZINE, ondansetron (ZOFRAN) IV, ondansetron  Assessment/Plan:  #1. Cellulitis involving the lower abdominal wall and groin - patient has been improving on empiric IV antibiotics vancomycin and Cipro. clinically improving Keep area dry and clean #2.  Dysuria with possible UTI - urine cultures polymicrobial, collect again,  Patient is on Cipro.  #3. Diabetes mellitus 2 - continue lantus, increase dose with sliding-scale coverage and check hemoglobin A1c.  #4. History of COPD/asthma with ongoing tobacco abuse - stable,  Continue advair and albuterol PRN,  Patient advised to quit smoking.  #5. Hypertension  continue home medications.  #6. Schizophrenia - continue flufenazine and trihexyphenidyl.  DIspo: home tomorrow  FULL CODE  LOS: 2 days   [facility] Pager: [phone] 07/01/2012, 2:03 PM

## 2012-07-02 LAB — CBC
Hemoglobin: 12 g/dL (ref 12.0–15.0)
MCH: 26 pg (ref 26.0–34.0)
Platelets: 375 10*3/uL (ref 150–400)
RBC: 4.62 MIL/uL (ref 3.87–5.11)
WBC: 13.3 10*3/uL — ABNORMAL HIGH (ref 4.0–10.5)

## 2012-07-02 LAB — GLUCOSE, CAPILLARY: Glucose-Capillary: 175 mg/dL — ABNORMAL HIGH (ref 70–99)

## 2012-07-02 MED ORDER — DOXYCYCLINE HYCLATE 100 MG PO TABS: 100.0000 mg | ORAL_TABLET | Freq: Two times a day (BID) | ORAL | Status: DC

## 2012-07-02 MED ORDER — ASPIRIN 81 MG PO TABS: 81.0000 mg | ORAL_TABLET | Freq: Every day | ORAL | Status: AC

## 2012-07-02 MED ORDER — ACETAMINOPHEN 325 MG PO TABS: 650.0000 mg | ORAL_TABLET | Freq: Four times a day (QID) | ORAL | Status: AC | PRN

## 2012-07-02 NOTE — ED Provider Notes (Signed)
Medical screening examination/treatment/procedure(s) were performed by non-physician practitioner and as supervising physician I was immediately available for consultation/collaboration.  Jones Skene, M.D.     Jones Skene, MD 07/02/12 1006

## 2012-07-03 LAB — URINE CULTURE

## 2012-07-05 ENCOUNTER — Inpatient Hospital Stay (HOSPITAL_COMMUNITY)
Admission: EM | Admit: 2012-07-05 | Discharge: 2012-07-09 | DRG: 064 | Disposition: A | Discharge status: Home / Self Care | Payer: Medicaid Other | Attending: Internal Medicine | Admitting: Internal Medicine

## 2012-07-05 ENCOUNTER — Encounter (HOSPITAL_COMMUNITY): Payer: Self-pay

## 2012-07-05 DIAGNOSIS — Z792 Long term (current) use of antibiotics: Secondary | ICD-10-CM

## 2012-07-05 DIAGNOSIS — Z6836 Body mass index (BMI) 36.0-36.9, adult: Secondary | ICD-10-CM

## 2012-07-05 DIAGNOSIS — I63239 Cerebral infarction due to unspecified occlusion or stenosis of unspecified carotid arteries: Principal | ICD-10-CM | POA: Diagnosis present

## 2012-07-05 DIAGNOSIS — R82998 Other abnormal findings in urine: Secondary | ICD-10-CM | POA: Diagnosis present

## 2012-07-05 DIAGNOSIS — I10 Essential (primary) hypertension: Secondary | ICD-10-CM | POA: Diagnosis present

## 2012-07-05 DIAGNOSIS — Z880 Allergy status to penicillin: Secondary | ICD-10-CM

## 2012-07-05 DIAGNOSIS — J4489 Other specified chronic obstructive pulmonary disease: Secondary | ICD-10-CM | POA: Diagnosis present

## 2012-07-05 DIAGNOSIS — I639 Cerebral infarction, unspecified: Secondary | ICD-10-CM | POA: Diagnosis present

## 2012-07-05 DIAGNOSIS — M8448XA Pathological fracture, other site, initial encounter for fracture: Secondary | ICD-10-CM | POA: Diagnosis present

## 2012-07-05 DIAGNOSIS — Z853 Personal history of malignant neoplasm of breast: Secondary | ICD-10-CM

## 2012-07-05 DIAGNOSIS — R079 Chest pain, unspecified: Secondary | ICD-10-CM

## 2012-07-05 DIAGNOSIS — M129 Arthropathy, unspecified: Secondary | ICD-10-CM | POA: Diagnosis present

## 2012-07-05 DIAGNOSIS — F3289 Other specified depressive episodes: Secondary | ICD-10-CM | POA: Diagnosis present

## 2012-07-05 DIAGNOSIS — E669 Obesity, unspecified: Secondary | ICD-10-CM | POA: Diagnosis present

## 2012-07-05 DIAGNOSIS — E119 Type 2 diabetes mellitus without complications: Secondary | ICD-10-CM | POA: Diagnosis present

## 2012-07-05 DIAGNOSIS — D72829 Elevated white blood cell count, unspecified: Secondary | ICD-10-CM | POA: Diagnosis not present

## 2012-07-05 DIAGNOSIS — A6000 Herpesviral infection of urogenital system, unspecified: Secondary | ICD-10-CM | POA: Diagnosis present

## 2012-07-05 DIAGNOSIS — R55 Syncope and collapse: Secondary | ICD-10-CM | POA: Diagnosis present

## 2012-07-05 DIAGNOSIS — I658 Occlusion and stenosis of other precerebral arteries: Secondary | ICD-10-CM | POA: Diagnosis present

## 2012-07-05 DIAGNOSIS — R197 Diarrhea, unspecified: Secondary | ICD-10-CM

## 2012-07-05 DIAGNOSIS — F329 Major depressive disorder, single episode, unspecified: Secondary | ICD-10-CM | POA: Diagnosis present

## 2012-07-05 DIAGNOSIS — J449 Chronic obstructive pulmonary disease, unspecified: Secondary | ICD-10-CM | POA: Diagnosis present

## 2012-07-05 DIAGNOSIS — G9340 Encephalopathy, unspecified: Secondary | ICD-10-CM | POA: Diagnosis present

## 2012-07-05 DIAGNOSIS — J45909 Unspecified asthma, uncomplicated: Secondary | ICD-10-CM | POA: Diagnosis present

## 2012-07-05 DIAGNOSIS — N390 Urinary tract infection, site not specified: Secondary | ICD-10-CM

## 2012-07-05 DIAGNOSIS — F259 Schizoaffective disorder, unspecified: Secondary | ICD-10-CM | POA: Diagnosis present

## 2012-07-05 DIAGNOSIS — F251 Schizoaffective disorder, depressive type: Secondary | ICD-10-CM | POA: Diagnosis present

## 2012-07-05 DIAGNOSIS — I6529 Occlusion and stenosis of unspecified carotid artery: Secondary | ICD-10-CM | POA: Diagnosis present

## 2012-07-05 DIAGNOSIS — F17200 Nicotine dependence, unspecified, uncomplicated: Secondary | ICD-10-CM | POA: Diagnosis present

## 2012-07-05 DIAGNOSIS — D649 Anemia, unspecified: Secondary | ICD-10-CM | POA: Diagnosis present

## 2012-07-05 DIAGNOSIS — Z9089 Acquired absence of other organs: Secondary | ICD-10-CM

## 2012-07-05 DIAGNOSIS — Z8673 Personal history of transient ischemic attack (TIA), and cerebral infarction without residual deficits: Secondary | ICD-10-CM

## 2012-07-05 DIAGNOSIS — L03311 Cellulitis of abdominal wall: Secondary | ICD-10-CM | POA: Diagnosis present

## 2012-07-05 DIAGNOSIS — Z794 Long term (current) use of insulin: Secondary | ICD-10-CM

## 2012-07-05 DIAGNOSIS — E785 Hyperlipidemia, unspecified: Secondary | ICD-10-CM | POA: Diagnosis present

## 2012-07-05 DIAGNOSIS — Z7982 Long term (current) use of aspirin: Secondary | ICD-10-CM

## 2012-07-05 LAB — POCT I-STAT, CHEM 8
Calcium, Ion: 1.15 mmol/L (ref 1.13–1.30)
Glucose, Bld: 128 mg/dL — ABNORMAL HIGH (ref 70–99)
HCT: 41 % (ref 36.0–46.0)
Hemoglobin: 13.9 g/dL (ref 12.0–15.0)

## 2012-07-05 MED ORDER — IPRATROPIUM BROMIDE 0.02 % IN SOLN
0.5000 mg | Freq: Once | RESPIRATORY_TRACT | Status: AC
  Administered 2012-07-05: 0.5 mg via RESPIRATORY_TRACT
  Filled 2012-07-05: qty 2.5

## 2012-07-05 MED ORDER — ALBUTEROL SULFATE (5 MG/ML) 0.5% IN NEBU
5.0000 mg | INHALATION_SOLUTION | Freq: Once | RESPIRATORY_TRACT | Status: AC
  Administered 2012-07-05: 5 mg via RESPIRATORY_TRACT
  Filled 2012-07-05: qty 1

## 2012-07-05 NOTE — ED Notes (Signed)
Patient was walking in her house experienced a near syncopal episode, denies losing LOC; patient also has right sided weakness which was present prior due to history of TIA.  Patient alert and oriented x 4, follows commands. Stroke screen negative.  Patient seen for same several weeks ago.

## 2012-07-05 NOTE — ED Provider Notes (Signed)
History     CSN: [id number]  Arrival date & time 07/05/12  2150   First MD Initiated Contact with Patient 07/05/12 2210      Chief Complaint  Patient presents with  . Near Syncope    (Consider location/radiation/quality/duration/timing/severity/associated sxs/prior treatment) HPI Comments: Patient comes in today with a chief complaint of near syncope.  Patient is not a good historian.  However, she reports that she became dizzy and lightheaded earlier today when she went from a sitting to a standing position.  She denies LOC.  She states that her symptoms have resolved at this time.  She denies chest pain.  She denies SOB.  She denies headache.  No nausea or vomiting.  She has a history of COPD and states that she normally uses oxygen at home.  Patient also has a history of HTN, Hyperlipidemia, CVA, Asthma, DM, and Schizophrenia.  Patient was recently admitted on 06/29/12 for a Cellulitis of her abdomen.  She was discharged home on 07/01/12 and has been on Doxycycline.  She reports that the area of Cellulitis is improving.  She denies fever or chills.  Her PCP is Dr. Concepcion Elk.    The history is provided by the patient.    Past Medical History  Diagnosis Date  . Hypertension   . Dysphagia   . TIA (transient ischemic attack)   . Genital herpes   . DM neuropathies   . Anemia   . Arthritis   . Asthma   . Cancer     lt breast  . Schizophrenia, simple   . Diabetes mellitus   . Stroke     Past Surgical History  Procedure Date  . Cholecystectomy   . Cesarean section   . Tonsillectomy     No family history on file.  History  Substance Use Topics  . Smoking status: Current Everyday Smoker -- 0.5 packs/day    Types: Cigarettes  . Smokeless tobacco: Never Used  . Alcohol Use: No    OB History    Grav Para Term Preterm Abortions TAB SAB Ect Mult Living                  Review of Systems  Constitutional: Negative for fever and chills.  HENT: Negative for neck pain and neck  stiffness.   Eyes: Negative for visual disturbance.  Respiratory: Negative for shortness of breath.   Cardiovascular: Negative for chest pain and leg swelling.  Gastrointestinal: Negative for nausea, vomiting, abdominal pain and diarrhea.  Genitourinary: Negative for dysuria, frequency and decreased urine volume.  Neurological: Positive for dizziness and light-headedness. Negative for syncope, speech difficulty and headaches.    Allergies  Penicillins  Home Medications   Current Outpatient Rx  Name Route Sig Dispense Refill  . ACETAMINOPHEN 325 MG PO TABS Oral Take 2 tablets (650 mg total) by mouth every 6 (six) hours as needed for pain or fever (or Fever >/= 101).    . ALBUTEROL SULFATE HFA 108 (90 BASE) MCG/ACT IN AERS Inhalation Inhale 2 puffs into the lungs every 6 (six) hours as needed. For dyspnea 1 Inhaler 11  . AMLODIPINE BESYLATE 10 MG PO TABS Oral Take 10 mg by mouth daily.    . ASPIRIN 81 MG PO TABS Oral Take 1 tablet (81 mg total) by mouth daily. 30 tablet   . ATORVASTATIN CALCIUM 10 MG PO TABS Oral Take 1 tablet (10 mg total) by mouth at bedtime. For cholesterol.    Marland Kitchen DOXYCYCLINE HYCLATE 100 MG  PO TABS Oral Take 1 tablet (100 mg total) by mouth 2 (two) times daily. For 5 days 10 tablet 0  . FLUPHENAZINE HCL 5 MG PO TABS Oral Take 10 mg by mouth at bedtime.    Marland Kitchen FLUPHENAZINE DECANOATE 25 MG/ML IJ SOLN Intramuscular Inject 1 mL (25 mg total) into the muscle every 14 (fourteen) days. For psychosis. 5 mL 0  . FLUTICASONE-SALMETEROL 250-50 MCG/DOSE IN AEPB Inhalation Inhale 1 puff into the lungs 2 (two) times daily. For COPD 60 each 11  . GABAPENTIN 300 MG PO CAPS Oral Take 300 mg by mouth 3 (three) times daily. For anxiety and depression.     Marland Kitchen HALOPERIDOL DECANOATE 50 MG/ML IM SOLN Intramuscular Inject 50 mg into the muscle every 28 (twenty-eight) days.    Marland Kitchen HYDRALAZINE HCL 50 MG PO TABS Oral Take 1 tablet (50 mg total) by mouth 3 (three) times daily. For elevated blood pressure.  90 tablet 0  . INSULIN GLARGINE 100 UNIT/ML [place] SOLN Subcutaneous Inject 30 Units into the skin at bedtime. For glycemic control. 10 mL   . METFORMIN HCL 500 MG PO TABS Oral Take 1 tablet (500 mg total) by mouth 2 (two) times daily with a meal. For glycemic control.    . OXYBUTYNIN CHLORIDE ER 10 MG PO TB24 Oral Take 1 tablet (10 mg total) by mouth at bedtime. For overactive bladder. 30 tablet 0  . PANTOPRAZOLE SODIUM 40 MG PO TBEC Oral Take 1 tablet (40 mg total) by mouth daily. For Reflux.    . SERTRALINE HCL 100 MG PO TABS Oral Take 2 tablets (200 mg total) by mouth daily. For depression and anxiety. 60 tablet 0  . TRAZODONE HCL 100 MG PO TABS Oral Take 100 mg by mouth at bedtime.    . TRIHEXYPHENIDYL HCL 2 MG PO TABS Oral Take 0.5 tablets (1 mg total) by mouth at bedtime. For EPS. 15 tablet 0    BP 140/55  Temp 97.8 F (36.6 C) (Oral)  Resp 18  SpO2 99%  Physical Exam  Nursing note and vitals reviewed. Constitutional: She is oriented to person, place, and time. She appears well-developed and well-nourished. No distress.  HENT:  Head: Normocephalic and atraumatic.  Mouth/Throat: Oropharynx is clear and moist.  Eyes: EOM are normal. Pupils are equal, round, and reactive to light.  Neck: Normal range of motion. Neck supple.  Cardiovascular: Normal rate, regular rhythm and normal heart sounds.   Pulmonary/Chest: Effort normal. She has wheezes.  Abdominal: Soft. There is no tenderness.  Musculoskeletal: Normal range of motion.  Neurological: She is alert and oriented to person, place, and time. She has normal strength. No cranial nerve deficit or sensory deficit.  Skin: Skin is warm, dry and intact. She is not diaphoretic.     Psychiatric: She has a normal mood and affect.    ED Course  Procedures (including critical care time)  Labs Reviewed - No data to display No results found.   No diagnosis found.   Date: 07/06/2012  Rate: 77  Rhythm: normal sinus rhythm  QRS Axis:  normal  Intervals: normal  ST/T Wave abnormalities: normal  Conduction Disutrbances:none  Narrative Interpretation:   Old EKG Reviewed: unchanged  Reassessed patient.  Patient now has an altered mental status.  She only responds to sternal rub.  She does become alert after sternal rub.  Will order additional labs and imaging and get the patient admitted for Altered Mental Status.  2:00 AM Discussed the patient with Dr.  Toniann Fail.  He is wanting to be called back once the results of the patient have returned.    2:10 AM Patient signed out to [facility], PA-C who will follow up on the patient's results.  MDM  Patient presenting with a chief complaint of near syncope.  Initial lab work unremarkable.  Patient was not orthostatic.  No acute findings on EKG.  No acute findings on Neurological exam.  Suspected that medications had contributed.  During my evaluation she stated that her symptoms had resolved.  Reevaluated the patient prior to discharge and her mental status had changed.  She was altered and could only be aroused using sternal rub.  Patient was therefore admitted for further management and evaluation.          Pascal Lux [place], PA-C 07/07/12 [phone]

## 2012-07-06 ENCOUNTER — Emergency Department (HOSPITAL_COMMUNITY): Payer: Medicaid Other

## 2012-07-06 ENCOUNTER — Encounter (HOSPITAL_COMMUNITY): Payer: Self-pay | Admitting: Radiology

## 2012-07-06 ENCOUNTER — Inpatient Hospital Stay (HOSPITAL_COMMUNITY): Payer: Medicaid Other

## 2012-07-06 DIAGNOSIS — R6889 Other general symptoms and signs: Secondary | ICD-10-CM

## 2012-07-06 DIAGNOSIS — D72829 Elevated white blood cell count, unspecified: Secondary | ICD-10-CM | POA: Diagnosis present

## 2012-07-06 DIAGNOSIS — G9340 Encephalopathy, unspecified: Secondary | ICD-10-CM | POA: Diagnosis present

## 2012-07-06 LAB — HEPATIC FUNCTION PANEL
ALT: 14 U/L (ref 0–35)
AST: 13 U/L (ref 0–37)
Albumin: 3.3 g/dL — ABNORMAL LOW (ref 3.5–5.2)
Bilirubin, Direct: <0.1 mg/dL (ref 0.0–0.3)
Total Protein: 7.3 g/dL (ref 6.0–8.3)

## 2012-07-06 LAB — URINALYSIS, ROUTINE W REFLEX MICROSCOPIC
Hgb urine dipstick: NEGATIVE
Ketones, ur: NEGATIVE mg/dL
Nitrite: NEGATIVE
Urobilinogen, UA: 1 mg/dL (ref 0.0–1.0)
pH: 5.5 (ref 5.0–8.0)

## 2012-07-06 LAB — MRSA PCR SCREENING: MRSA by PCR: NEGATIVE

## 2012-07-06 LAB — CBC WITH DIFFERENTIAL/PLATELET
Basophils Relative: 1 % (ref 0–1)
Basophils Relative: 0 % (ref 0–1)
Eosinophils Absolute: 0.3 10*3/uL (ref 0.0–0.7)
Eosinophils Absolute: 0.4 10*3/uL (ref 0.0–0.7)
Eosinophils Relative: 2 % (ref 0–5)
Hemoglobin: 11.8 g/dL — ABNORMAL LOW (ref 12.0–15.0)
Hemoglobin: 12.5 g/dL (ref 12.0–15.0)
MCH: 27.1 pg (ref 26.0–34.0)
MCHC: 32.5 g/dL (ref 30.0–36.0)
MCHC: 31.6 g/dL (ref 30.0–36.0)
MCV: 83.4 fL (ref 78.0–100.0)
Monocytes Relative: 7 % (ref 3–12)
Monocytes Relative: 9 % (ref 3–12)
Neutro Abs: 8.8 10*3/uL — ABNORMAL HIGH (ref 1.7–7.7)
Neutrophils Relative %: 62 % (ref 43–77)
Neutrophils Relative %: 58 % (ref 43–77)
Platelets: 361 10*3/uL (ref 150–400)
RBC: 4.75 MIL/uL (ref 3.87–5.11)

## 2012-07-06 LAB — COMPREHENSIVE METABOLIC PANEL
ALT: 15 U/L (ref 0–35)
CO2: 29 mEq/L (ref 19–32)
Calcium: 9.3 mg/dL (ref 8.4–10.5)
Chloride: 100 mEq/L (ref 96–112)
Creatinine, Ser: 0.59 mg/dL (ref 0.50–1.10)
GFR calc Af Amer: >90 mL/min (ref >90)
GFR calc non Af Amer: >90 mL/min (ref >90)
Glucose, Bld: 105 mg/dL — ABNORMAL HIGH (ref 70–99)
Total Bilirubin: 0.2 mg/dL — ABNORMAL LOW (ref 0.3–1.2)

## 2012-07-06 LAB — RAPID URINE DRUG SCREEN, HOSP PERFORMED
Barbiturates: NOT DETECTED
Cocaine: NOT DETECTED
Tetrahydrocannabinol: NOT DETECTED

## 2012-07-06 LAB — POCT I-STAT 3, ART BLOOD GAS (G3+)
Bicarbonate: 30.4 mEq/L — ABNORMAL HIGH (ref 20.0–24.0)
pCO2 arterial: 52.3 mmHg — ABNORMAL HIGH (ref 35.0–45.0)
pH, Arterial: 7.373 (ref 7.350–7.450)
pO2, Arterial: 61 mmHg — ABNORMAL LOW (ref 80.0–100.0)

## 2012-07-06 LAB — URINE MICROSCOPIC-ADD ON

## 2012-07-06 LAB — TSH: TSH: 1.699 u[IU]/mL (ref 0.350–4.500)

## 2012-07-06 LAB — ACETAMINOPHEN LEVEL: Acetaminophen (Tylenol), Serum: <15 ug/mL (ref 10–30)

## 2012-07-06 LAB — GLUCOSE, CAPILLARY: Glucose-Capillary: 136 mg/dL — ABNORMAL HIGH (ref 70–99)

## 2012-07-06 LAB — SALICYLATE LEVEL: Salicylate Lvl: <2 mg/dL — ABNORMAL LOW (ref 2.8–20.0)

## 2012-07-06 LAB — HEMOGLOBIN A1C: Hgb A1c MFr Bld: 6.8 % — ABNORMAL HIGH (ref <5.7)

## 2012-07-06 MED ORDER — INSULIN ASPART 100 UNIT/ML ~~LOC~~ SOLN
0.0000 [IU] | Freq: Three times a day (TID) | SUBCUTANEOUS | Status: DC
  Administered 2012-07-07: 1 [IU] via SUBCUTANEOUS
  Administered 2012-07-08: 2 [IU] via SUBCUTANEOUS
  Administered 2012-07-09: 5 [IU] via SUBCUTANEOUS

## 2012-07-06 MED ORDER — TRIHEXYPHENIDYL HCL 2 MG PO TABS
1.0000 mg | ORAL_TABLET | Freq: Every day | ORAL | Status: DC
  Administered 2012-07-06 – 2012-07-08 (×3): 1 mg via ORAL
  Filled 2012-07-06 (×5): qty 1

## 2012-07-06 MED ORDER — HYDRALAZINE HCL 50 MG PO TABS
50.0000 mg | ORAL_TABLET | Freq: Three times a day (TID) | ORAL | Status: DC
  Administered 2012-07-06 – 2012-07-09 (×10): 50 mg via ORAL
  Filled 2012-07-06 (×11): qty 1

## 2012-07-06 MED ORDER — FLUTICASONE-SALMETEROL 250-50 MCG/DOSE IN AEPB
1.0000 | INHALATION_SPRAY | Freq: Two times a day (BID) | RESPIRATORY_TRACT | Status: DC
  Administered 2012-07-06 – 2012-07-08 (×5): 1 via RESPIRATORY_TRACT
  Filled 2012-07-06: qty 14

## 2012-07-06 MED ORDER — HALOPERIDOL DECANOATE 50 MG/ML IM SOLN: 50.0000 mg | INTRAMUSCULAR | Status: DC

## 2012-07-06 MED ORDER — ENOXAPARIN SODIUM 40 MG/0.4ML ~~LOC~~ SOLN
40.0000 mg | SUBCUTANEOUS | Status: DC
  Administered 2012-07-06 – 2012-07-08 (×3): 40 mg via SUBCUTANEOUS
  Filled 2012-07-06 (×4): qty 0.4

## 2012-07-06 MED ORDER — FLUPHENAZINE HCL 10 MG PO TABS
10.0000 mg | ORAL_TABLET | Freq: Every day | ORAL | Status: DC
  Administered 2012-07-06 – 2012-07-08 (×3): 10 mg via ORAL
  Filled 2012-07-06 (×5): qty 1

## 2012-07-06 MED ORDER — INSULIN GLARGINE 100 UNIT/ML ~~LOC~~ SOLN
30.0000 [IU] | Freq: Every day | SUBCUTANEOUS | Status: DC
Start: 2012-07-06 — End: 2012-07-09
  Administered 2012-07-06 – 2012-07-08 (×3): 30 [IU] via SUBCUTANEOUS

## 2012-07-06 MED ORDER — ATORVASTATIN CALCIUM 10 MG PO TABS
10.0000 mg | ORAL_TABLET | Freq: Every day | ORAL | Status: DC
Start: 2012-07-06 — End: 2012-07-09
  Administered 2012-07-06 – 2012-07-08 (×3): 10 mg via ORAL
  Filled 2012-07-06 (×4): qty 1

## 2012-07-06 MED ORDER — ALBUTEROL SULFATE (5 MG/ML) 0.5% IN NEBU
2.5000 mg | INHALATION_SOLUTION | RESPIRATORY_TRACT | Status: DC
  Administered 2012-07-06 – 2012-07-07 (×8): 2.5 mg via RESPIRATORY_TRACT
  Filled 2012-07-06 (×7): qty 0.5

## 2012-07-06 MED ORDER — AMLODIPINE BESYLATE 10 MG PO TABS
10.0000 mg | ORAL_TABLET | Freq: Every day | ORAL | Status: DC
  Administered 2012-07-07 – 2012-07-09 (×3): 10 mg via ORAL
  Filled 2012-07-06 (×4): qty 1

## 2012-07-06 MED ORDER — TRAZODONE HCL 100 MG PO TABS
100.0000 mg | ORAL_TABLET | Freq: Every day | ORAL | Status: DC
  Administered 2012-07-06 – 2012-07-08 (×3): 100 mg via ORAL
  Filled 2012-07-06 (×4): qty 1

## 2012-07-06 MED ORDER — HYDRALAZINE HCL 50 MG PO TABS
50.0000 mg | ORAL_TABLET | Freq: Three times a day (TID) | ORAL | Status: DC
  Filled 2012-07-06 (×3): qty 1

## 2012-07-06 MED ORDER — DOXYCYCLINE HYCLATE 100 MG PO TABS
100.0000 mg | ORAL_TABLET | Freq: Two times a day (BID) | ORAL | Status: DC
  Administered 2012-07-06 – 2012-07-09 (×6): 100 mg via ORAL
  Filled 2012-07-06 (×8): qty 1

## 2012-07-06 MED ORDER — ASPIRIN 81 MG PO TABS: 81.0000 mg | ORAL_TABLET | Freq: Every day | ORAL | Status: DC

## 2012-07-06 MED ORDER — BUDESONIDE 0.5 MG/2ML IN SUSP
0.5000 mg | Freq: Two times a day (BID) | RESPIRATORY_TRACT | Status: DC
  Administered 2012-07-06 – 2012-07-09 (×5): 0.5 mg via RESPIRATORY_TRACT
  Filled 2012-07-06 (×10): qty 2

## 2012-07-06 MED ORDER — SODIUM CHLORIDE 0.9 % IV SOLN
INTRAVENOUS | Status: DC
  Administered 2012-07-06: 17:00:00 via INTRAVENOUS

## 2012-07-06 MED ORDER — SERTRALINE HCL 100 MG PO TABS
200.0000 mg | ORAL_TABLET | Freq: Every day | ORAL | Status: DC
  Administered 2012-07-07 – 2012-07-09 (×3): 200 mg via ORAL
  Filled 2012-07-06 (×4): qty 2

## 2012-07-06 MED ORDER — PANTOPRAZOLE SODIUM 40 MG PO TBEC
40.0000 mg | DELAYED_RELEASE_TABLET | Freq: Every day | ORAL | Status: DC
  Administered 2012-07-07 – 2012-07-09 (×3): 40 mg via ORAL
  Filled 2012-07-06: qty 1

## 2012-07-06 MED ORDER — FLUPHENAZINE DECANOATE 25 MG/ML IJ SOLN: 25.0000 mg | INTRAMUSCULAR | Status: DC

## 2012-07-06 MED ORDER — ALBUTEROL SULFATE (5 MG/ML) 0.5% IN NEBU: 2.5000 mg | INHALATION_SOLUTION | RESPIRATORY_TRACT | Status: DC | PRN

## 2012-07-06 MED ORDER — IPRATROPIUM BROMIDE 0.02 % IN SOLN
0.5000 mg | RESPIRATORY_TRACT | Status: DC
  Administered 2012-07-06 – 2012-07-07 (×8): 0.5 mg via RESPIRATORY_TRACT
  Filled 2012-07-06 (×7): qty 2.5

## 2012-07-06 MED ORDER — ASPIRIN EC 81 MG PO TBEC
81.0000 mg | DELAYED_RELEASE_TABLET | Freq: Every day | ORAL | Status: DC
  Administered 2012-07-07: 81 mg via ORAL
  Filled 2012-07-06 (×2): qty 1

## 2012-07-06 MED ORDER — GABAPENTIN 300 MG PO CAPS
300.0000 mg | ORAL_CAPSULE | Freq: Three times a day (TID) | ORAL | Status: DC
  Administered 2012-07-06 – 2012-07-09 (×10): 300 mg via ORAL
  Filled 2012-07-06 (×12): qty 1

## 2012-07-06 NOTE — Progress Notes (Addendum)
[facility]  Patient examined. She is currently awake, alert and following commands well but quite confused to date (thinks its 1952), place and situation. She does not remember being brought to the hospital. ER notes mention that she was brought in by her boyfriend for near syncope at home. She subsequentially became unresponsive in the ER and was referred for admission.  She was recently admitted from 9/29 through 9/31 and was treated for a cellulitis of the abdominal wall. I am not certain if she was discharged on antibiotics but it appears from the admission med reconciliation, she was not taking antibiotics. She is unable to give me much of a history due to confusion. She was recently admitted to [facility] for acute psychosis and discharged to return home with her boyfriend - it is noted that she was discharged with IM antipsychotics to be used routinely. The patient states she has not used them but due to her acute confusion, I am unable to to determine if she is giving up appropriate information. Per RN she was quite lethargic this morning but apparent awoke late afternoon.   Assessment/ Plan  Acute Encephalopathy-  delirium vs CVA- MRI has been ordered and will follow. I have tried calling her sister who is listed as a contact and have tried the home number to reach her boyfriend but unable to reach either- sister's number is disconnected and home number goes to voicemail. I am uncertain if the patient has been taking her home medications appropriately- we have resumed them per Med reconciliation for now.   Near syncope Apparently this was the reason she was brought to the hospital- again uncertain if her antipsychotics resulted in excessive sedation or whether this was related to an arrythmia or CVA - at this point I see no focal deficits on my Neuro exam. F/u on MRI and cont to follow on telemetry. I will check orthostatics as well. I feel we can move her out of the SDU but may need a sitter for  her if agitation is noted in addition to confusion.   H/o recent abdominal wall cellulitis On exam the abdominal wall appears free of infection- Doxycycline has been resumed, I suspect to complete the full course - will continue it for now.    COPD Currently no noted exacerbation  DM type 2 Home medications except for Metformin have been resumed- sugars stable  Schizophrenia Home medications resumed- checking with her home health RN to see when her last dose of IM Prolixin was- will hold off on IM Prolixin and Haldol for now due to high risk of sedation and the fact that she was quite lethargic after presenting to the ER.   HTN Cont Hydralazine with holding parameters    CODE STATUS - full code blue Disposition- transfer to telemetry DVT prophylaxis- Lovenox  Calvert Cantor, MD [phone]

## 2012-07-06 NOTE — H&P (Addendum)
Sherri Hays is an 61 y.o. female.  Patient was seen and examined on July 06, 2012 at 5:20 AM. PCP - Dr. Tyson Dense. Chief Complaint:  Altered mental status. HPI:  61 year-old female with history of diabetes mellitus2, hypertension, hyperlipidemia, schizophrenia who was just recently discharged after being treated for abdominal wall cellulitis came to the ER because of dizziness. After being initially evaluated patient was orally discharge when the ER physician found patient to be only responsive to sternal rub. CT head did not show any acute ammonia levels LFTs and urine drug screen were negative. At this time patient is still drowsy and not arousable except with painful stimuli. Patient has been admitted for acute change in mental status. Patient is not febrile.  Past Medical History  Diagnosis Date  . Hypertension   . Dysphagia   . TIA (transient ischemic attack)   . Genital herpes   . DM neuropathies   . Anemia   . Arthritis   . Asthma   . Cancer     lt breast  . Schizophrenia, simple   . Diabetes mellitus   . Stroke     Past Surgical History  Procedure Date  . Cholecystectomy   . Cesarean section   . Tonsillectomy     History reviewed. No pertinent family history. Social History:  reports that she has been smoking Cigarettes.  She has been smoking about .5 packs per day. She has never used smokeless tobacco. She reports that she does not drink alcohol or use illicit drugs.  Allergies:  Allergies  Allergen Reactions  . Penicillins Other (See Comments)    unknown     (Not in a hospital admission)  Results for orders placed during the hospital encounter of 07/05/12 (from the past 48 hour(s))  POCT I-STAT, CHEM 8     Status: Abnormal   Collection Time   07/05/12 11:10 PM      Component Value Range Comment   Sodium 141  135 - 145 mEq/L    Potassium 3.6  3.5 - 5.1 mEq/L    Chloride 100  96 - 112 mEq/L    BUN 21  6 - 23 mg/dL    Creatinine, Ser 1.85  0.50 - 1.10  mg/dL    Glucose, Bld 631 (*) 70 - 99 mg/dL    Calcium, Ion 4.97  0.26 - 1.30 mmol/L    TCO2 29  0 - 100 mmol/L    Hemoglobin 13.9  12.0 - 15.0 g/dL    HCT 37.8  58.8 - 50.2 %   CBC WITH DIFFERENTIAL     Status: Abnormal   Collection Time   07/06/12  1:32 AM      Component Value Range Comment   WBC 14.3 (*) 4.0 - 10.5 K/uL    RBC 4.35  3.87 - 5.11 MIL/uL    Hemoglobin 11.8 (*) 12.0 - 15.0 g/dL    HCT 77.4  12.8 - 78.6 %    MCV 83.4  78.0 - 100.0 fL    MCH 27.1  26.0 - 34.0 pg    MCHC 32.5  30.0 - 36.0 g/dL    RDW 76.7 (*) 20.9 - 15.5 %    Platelets 334  150 - 400 K/uL    Neutrophils Relative 62  43 - 77 %    Neutro Abs 8.9 (*) 1.7 - 7.7 K/uL    Lymphocytes Relative 28  12 - 46 %    Lymphs Abs 4.0  0.7 - 4.0  K/uL    Monocytes Relative 7  3 - 12 %    Monocytes Absolute 1.0  0.1 - 1.0 K/uL    Eosinophils Relative 2  0 - 5 %    Eosinophils Absolute 0.3  0.0 - 0.7 K/uL    Basophils Relative 1  0 - 1 %    Basophils Absolute 0.1  0.0 - 0.1 K/uL   HEPATIC FUNCTION PANEL     Status: Abnormal   Collection Time   07/06/12  2:04 AM      Component Value Range Comment   Total Protein 7.3  6.0 - 8.3 g/dL    Albumin 3.3 (*) 3.5 - 5.2 g/dL    AST 13  0 - 37 U/L    ALT 14  0 - 35 U/L    Alkaline Phosphatase 78  39 - 117 U/L    Total Bilirubin 0.2 (*) 0.3 - 1.2 mg/dL    Bilirubin, Direct <1.6  0.0 - 0.3 mg/dL    Indirect Bilirubin NOT CALCULATED  0.3 - 0.9 mg/dL   ACETAMINOPHEN LEVEL     Status: Normal   Collection Time   07/06/12  2:04 AM      Component Value Range Comment   Acetaminophen (Tylenol), Serum <15.0  10 - 30 ug/mL   AMMONIA     Status: Normal   Collection Time   07/06/12  2:05 AM      Component Value Range Comment   Ammonia 35  11 - 60 umol/L   GLUCOSE, CAPILLARY     Status: Abnormal   Collection Time   07/06/12  2:14 AM      Component Value Range Comment   Glucose-Capillary 136 (*) 70 - 99 mg/dL   POCT I-STAT 3, BLOOD GAS (G3+)     Status: Abnormal   Collection Time    07/06/12  2:25 AM      Component Value Range Comment   pH, Arterial 7.373  7.350 - 7.450    pCO2 arterial 52.3 (*) 35.0 - 45.0 mmHg    pO2, Arterial 61.0 (*) 80.0 - 100.0 mmHg    Bicarbonate 30.4 (*) 20.0 - 24.0 mEq/L    TCO2 32  0 - 100 mmol/L    O2 Saturation 90.0      Acid-Base Excess 4.0 (*) 0.0 - 2.0 mmol/L    Patient temperature 98.6 F      Collection site RADIAL, ALLEN'S TEST ACCEPTABLE      Drawn by RT      Sample type ARTERIAL     URINALYSIS, ROUTINE W REFLEX MICROSCOPIC     Status: Abnormal   Collection Time   07/06/12  3:29 AM      Component Value Range Comment   Color, Urine YELLOW  YELLOW    APPearance CLEAR  CLEAR    Specific Gravity, Urine 1.022  1.005 - 1.030    pH 5.5  5.0 - 8.0    Glucose, UA NEGATIVE  NEGATIVE mg/dL    Hgb urine dipstick NEGATIVE  NEGATIVE    Bilirubin Urine SMALL (*) NEGATIVE    Ketones, ur NEGATIVE  NEGATIVE mg/dL    Protein, ur NEGATIVE  NEGATIVE mg/dL    Urobilinogen, UA 1.0  0.0 - 1.0 mg/dL    Nitrite NEGATIVE  NEGATIVE    Leukocytes, UA SMALL (*) NEGATIVE   URINE RAPID DRUG SCREEN (HOSP PERFORMED)     Status: Normal   Collection Time   07/06/12  3:29 AM  Component Value Range Comment   Opiates NONE DETECTED  NONE DETECTED    Cocaine NONE DETECTED  NONE DETECTED    Benzodiazepines NONE DETECTED  NONE DETECTED    Amphetamines NONE DETECTED  NONE DETECTED    Tetrahydrocannabinol NONE DETECTED  NONE DETECTED    Barbiturates NONE DETECTED  NONE DETECTED   URINE MICROSCOPIC-ADD ON     Status: Abnormal   Collection Time   07/06/12  3:29 AM      Component Value Range Comment   Squamous Epithelial / LPF FEW (*) RARE    WBC, UA 3-6  <3 WBC/hpf    Bacteria, UA RARE  RARE    Casts HYALINE CASTS (*) NEGATIVE    Dg Chest 2 View  07/06/2012  *RADIOLOGY REPORT*  Clinical Data: Lethargy, altered mental status, history hypertension, diabetes, asthma  CHEST - 2 VIEW  Comparison: 05/28/2012  Findings: Minimal enlargement of cardiac silhouette. Slight  rotation to the right. Mediastinal contours and pulmonary vascularity normal. Lungs clear. No pleural effusion or pneumothorax. No acute osseous findings.  IMPRESSION: Minimal enlargement of cardiac silhouette. No acute abnormalities.   Original Report Authenticated By: Lollie Marrow, M.D.    Ct Head Wo Contrast  07/06/2012  *RADIOLOGY REPORT*  Clinical Data: Near-syncope, history hypertension, diabetes, stroke  CT HEAD WITHOUT CONTRAST  Technique:  Contiguous axial images were obtained from the base of the skull through the vertex without contrast.  Comparison: 05/27/2012  Findings: Scattered streak artifacts from patient motion despite repeating images, limiting exam. Generalized atrophy. Normal ventricular morphology. No midline shift or mass effect. No gross intracranial hemorrhage or evidence of acute infarction identified though exam is limited by motion and intracranial pathology cannot be excluded. No definite evidence of mass or extra-axial fluid collection. No acute bone or sinus abnormalities.  IMPRESSION: Generalized atrophy. No gross acute intracranial abnormalities identified on exam limited by patient motion artifacts as above.   Original Report Authenticated By: Lollie Marrow, M.D.     Review of Systems  Constitutional: Negative.   HENT: Negative.   Eyes: Negative.   Respiratory: Negative.   Cardiovascular: Negative.   Gastrointestinal: Negative.   Genitourinary: Negative.   Musculoskeletal: Negative.   Skin: Negative.   Neurological: Positive for dizziness.  Endo/Heme/Allergies: Negative.   Psychiatric/Behavioral: Negative.     Blood pressure 114/59, pulse 67, temperature 97.8 F (36.6 C), temperature source Oral, resp. rate 15, SpO2 93.00%. Physical Exam  Constitutional: She appears well-developed and well-nourished. No distress.  HENT:  Head: Normocephalic and atraumatic.  Right Ear: External ear normal.  Left Ear: External ear normal.  Nose: Nose normal.    Mouth/Throat: Oropharynx is clear and moist. No oropharyngeal exudate.  Eyes: Conjunctivae are normal. Pupils are equal, round, and reactive to light. Right eye exhibits no discharge. Left eye exhibits no discharge. No scleral icterus.  Neck: Normal range of motion. Neck supple.  Cardiovascular: Normal rate and regular rhythm.   Respiratory: Effort normal and breath sounds normal. No respiratory distress. She has no wheezes. She has no rales.  GI: Soft. Bowel sounds are normal.  Musculoskeletal: She exhibits no edema and no tenderness.  Neurological:       Drowsy only arousable to sternal rub.  Skin: Skin is warm and dry. She is not diaphoretic.     Assessment/Plan #1. Acute encephalopathy - patient will be placed on neurochecks. Get MRI brain and EEG. Repeat ABG again in few hours to check for carbon dioxide trends. Closely monitor in step  down. #2. COPD - presently patient not wheezing. Recheck ABG in couple of hours as suggested earlier. We'll place patient on nebulizer and Pulmicort. #3. Diabetes mellitus2 - we'll continue home medications except for metformin if patient is back to normal few hours. #4. Hypertension and hyperlipidemia - continue home medications. #5. Schizophrenia - will continue home medications if patient becomes more alert and awake.   We'll need to get further history once patient is more alert and awake. Salicylate levels are still pending.  CODE STATUS - full code.  KAKRAKANDY,ARSHAD N. 07/06/2012, 5:45 AM

## 2012-07-06 NOTE — Progress Notes (Signed)
EEG COMPLETED

## 2012-07-06 NOTE — ED Notes (Addendum)
Patient's mental status improving. Drowsy/sleeping, easy to arouse to verbal stimuli. Answers questions appropriately. No complaints of pain or discomfort at this time. EDP notified of improvement. Will continue to monitor.

## 2012-07-06 NOTE — ED Notes (Signed)
Patient transported to X-ray 

## 2012-07-06 NOTE — ED Notes (Signed)
Placed call to [facility] for transport back home 

## 2012-07-06 NOTE — ED Notes (Addendum)
Patient up for discharge. Pt noted to have a change in mental status. Difficult to arouse even with ammonia inhalant. Obtunded. VSS. PA and EDP notified. Will continue to monitor.

## 2012-07-06 NOTE — Care Management Note (Signed)
    Page 1 of 1   07/06/2012     9:09:09 AM   CARE MANAGEMENT NOTE 07/06/2012  Patient:  MIYONNA, ORMISTON   Account Number:  [id number]  Date Initiated:  07/06/2012  Documentation initiated by:  Junius Creamer  Subjective/Objective Assessment:   adm w encepalopathy     Action/Plan:   lives w husband   Anticipated DC Date:     Anticipated DC Plan:        DC Planning Services  CM consult      Choice offered to / List presented to:             Status of service:   Medicare Important Message given?   (If response is "NO", the following Medicare IM given date fields will be blank) Date Medicare IM given:   Date Additional Medicare IM given:    Discharge Disposition:    Per UR Regulation:  Reviewed for med. necessity/level of care/duration of stay  If discussed at Long Length of Stay Meetings, dates discussed:    Comments:  9/5 9:08a debbie Autry [place] rn,bsn [phone]

## 2012-07-06 NOTE — Procedures (Signed)
History: 60 yo F admitted for altered mental status.   Sedation: None  Background: There is a well defined posterior dominant rhythm of 10 Hz that attenuates with eye opening. In addition, there is mild generalized irregular delta activity.  Hyperventilation: Not performed Photic stimulation: Physiologic driving is present  EEG Diagnosis: 1) Mild generalized irregular delta activity  Clinical Interpretation: This abnormal EEG is consistent with a mild non-specific generalized cerebral dysfunction(encephalopathy).  There was no seizure or evidence of seizure predisposition recorded on this study.   Ritta Slot, MD [facility] [phone]

## 2012-07-06 NOTE — ED Provider Notes (Signed)
BP 155/56  Pulse 63  Temp 97.8 F (36.6 C) (Oral)  Resp 16  SpO2 93%   Pt w hx of HTN, schizophrenia & TIA presents to ED for lightheadedness,dizziness and pre-syncopal event. While in the ER under Heather Vanwingen's care the pt developed alt mental status with arousal only to sternal rub. Pt re-evaluated & on exam: hemodynamically stable, NAD, heart w/ RRR, lungs CTAB, Chest & abd non-tender. Pt is still aroused by sternal rub, but only alert to self. LAbs and imaging reviewed. Repeat CBG 136, PH 7.3, UA without obvious infection -culture pending, drug screen negative, nl ammonia,  CT and CXR w no acute abnormalities.   I have seen this patient on 2 other occasions and she does not appear at her baseline. Last visit when she was admitted for cellulitis the pt recognized me and had a somewhat coherent conversation, allbeit tangential in nature. I recommend pt be admitted for further evaluation and observation.   Team 10 telemetry- Dr. Darrin Nipper, [place] 07/06/12 [phone]

## 2012-07-06 NOTE — Evaluation (Signed)
Clinical/Bedside Swallow Evaluation Patient Details  Name: Sherri Hays MRN: [id number] Date of Birth: 1951/07/30  Today's Date: 07/06/2012 Time: 1191-4782 SLP Time Calculation (min): 20 min  Past Medical History:  Past Medical History  Diagnosis Date  . Hypertension   . Dysphagia   . TIA (transient ischemic attack)   . Genital herpes   . DM neuropathies   . Anemia   . Arthritis   . Asthma   . Cancer     lt breast  . Schizophrenia, simple   . Diabetes mellitus   . Stroke    Past Surgical History:  Past Surgical History  Procedure Date  . Cholecystectomy   . Cesarean section   . Tonsillectomy    HPI:  61 year-old female with history of diabetes mellitus2, hypertension, hyperlipidemia, schizophrenia who was just recently discharged after being treated for abdominal wall cellulitis came to the ER because of dizziness. After being initially evaluated patient was orally discharge when the ER physician found patient to be only responsive to sternal rub. CT head did not show any acute ammonia levels LFTs and urine drug screen were negative.   Assessment / Plan / Recommendation Clinical Impression  Evaluation complete. Swallowing appears functional and safe at this time for advancement to a mechanical soft diet, thin liquids (due to missing dentures). Unclear h/o dysphagia as reported in chart as do not see indication of a previous MBS completed at this hospital and patient answers "yes" to most questions affecting reliability. No f/u SLP treatment indicated at this time however if needed in the future, please reconsult.  (education complete with patient regarding safe swallowing )    Aspiration Risk  Mild    Diet Recommendation Dysphagia 3 (Mechanical Soft);Thin liquid (may advance to regular if has dentures)   Liquid Administration via: Cup;Straw Medication Administration: Whole meds with liquid Supervision: Patient able to self feed;Intermittent supervision to cue for  compensatory strategies Compensations: Slow rate;Small sips/bites Postural Changes and/or Swallow Maneuvers: Seated upright 90 degrees    Other  Recommendations Oral Care Recommendations: Oral care BID   Follow Up Recommendations  None    Frequency and Duration        Pertinent Vitals/Pain n/a        Swallow Study    General HPI: 61 year-old female with history of diabetes mellitus2, hypertension, hyperlipidemia, schizophrenia who was just recently discharged after being treated for abdominal wall cellulitis came to the ER because of dizziness. After being initially evaluated patient was orally discharge when the ER physician found patient to be only responsive to sternal rub. CT head did not show any acute ammonia levels LFTs and urine drug screen were negative. Type of Study: Bedside swallow evaluation Previous Swallow Assessment: none found in chart Diet Prior to this Study: NPO Temperature Spikes Noted: No Respiratory Status: Room air History of Recent Intubation: No Behavior/Cognition: Alert;Cooperative;Pleasant mood;Confused;Other (comment) (answers yes to most questions affecting reliability) Oral Cavity - Dentition: Missing dentition Self-Feeding Abilities: Able to feed self Patient Positioning: Upright in bed Baseline Vocal Quality: Clear Volitional Cough: Strong Volitional Swallow: Able to elicit    Oral/Motor/Sensory Function Overall Oral Motor/Sensory Function: Appears within functional limits for tasks assessed   Ice Chips Ice chips: Not tested   Thin Liquid Thin Liquid: Within functional limits Presentation: Cup;Self Fed;Straw    Nectar Thick Nectar Thick Liquid: Not tested   Honey Thick Honey Thick Liquid: Not tested   Puree Puree: Not tested   Solid   GO  Sherri Lango MA, CCC-SLP [phone]  Solid: Within functional limits Presentation: Self Fed       Sherri Hays 07/06/2012,3:07 PM

## 2012-07-07 ENCOUNTER — Encounter (HOSPITAL_COMMUNITY): Payer: Self-pay | Admitting: Radiology

## 2012-07-07 ENCOUNTER — Inpatient Hospital Stay (HOSPITAL_COMMUNITY): Payer: Medicaid Other

## 2012-07-07 DIAGNOSIS — L03319 Cellulitis of trunk, unspecified: Secondary | ICD-10-CM

## 2012-07-07 DIAGNOSIS — R55 Syncope and collapse: Secondary | ICD-10-CM

## 2012-07-07 DIAGNOSIS — I517 Cardiomegaly: Secondary | ICD-10-CM

## 2012-07-07 DIAGNOSIS — I639 Cerebral infarction, unspecified: Secondary | ICD-10-CM | POA: Diagnosis present

## 2012-07-07 DIAGNOSIS — G9340 Encephalopathy, unspecified: Secondary | ICD-10-CM

## 2012-07-07 DIAGNOSIS — I6350 Cerebral infarction due to unspecified occlusion or stenosis of unspecified cerebral artery: Secondary | ICD-10-CM

## 2012-07-07 DIAGNOSIS — R4182 Altered mental status, unspecified: Secondary | ICD-10-CM

## 2012-07-07 LAB — GLUCOSE, CAPILLARY
Glucose-Capillary: 115 mg/dL — ABNORMAL HIGH (ref 70–99)
Glucose-Capillary: 130 mg/dL — ABNORMAL HIGH (ref 70–99)
Glucose-Capillary: 122 mg/dL — ABNORMAL HIGH (ref 70–99)

## 2012-07-07 LAB — LIPID PANEL
Cholesterol: 145 mg/dL (ref 0–200)
Triglycerides: 92 mg/dL (ref <150)
VLDL: 18 mg/dL (ref 0–40)

## 2012-07-07 LAB — RAPID URINE DRUG SCREEN, HOSP PERFORMED
Barbiturates: NOT DETECTED
Cocaine: NOT DETECTED
Tetrahydrocannabinol: NOT DETECTED

## 2012-07-07 MED ORDER — ALBUTEROL SULFATE (5 MG/ML) 0.5% IN NEBU
2.5000 mg | INHALATION_SOLUTION | Freq: Four times a day (QID) | RESPIRATORY_TRACT | Status: DC
  Administered 2012-07-07 – 2012-07-08 (×5): 2.5 mg via RESPIRATORY_TRACT
  Filled 2012-07-07 (×5): qty 0.5

## 2012-07-07 MED ORDER — CLOPIDOGREL BISULFATE 75 MG PO TABS
75.0000 mg | ORAL_TABLET | Freq: Every day | ORAL | Status: DC
  Administered 2012-07-08 – 2012-07-09 (×2): 75 mg via ORAL
  Filled 2012-07-07 (×3): qty 1

## 2012-07-07 MED ORDER — IOHEXOL 350 MG/ML SOLN
50.0000 mL | Freq: Once | INTRAVENOUS | Status: AC | PRN
  Administered 2012-07-07: 50 mL via INTRAVENOUS

## 2012-07-07 MED ORDER — IPRATROPIUM BROMIDE 0.02 % IN SOLN
0.5000 mg | Freq: Four times a day (QID) | RESPIRATORY_TRACT | Status: DC
  Administered 2012-07-07 – 2012-07-08 (×5): 0.5 mg via RESPIRATORY_TRACT
  Filled 2012-07-07 (×5): qty 2.5

## 2012-07-07 NOTE — Progress Notes (Signed)
  Echocardiogram 2D Echocardiogram has been performed.  Sherri Hays 07/07/2012, 4:09 PM

## 2012-07-07 NOTE — ED Provider Notes (Signed)
Medical screening examination/treatment/procedure(s) were performed by non-physician practitioner and as supervising physician I was immediately available for consultation/collaboration.  Jones Skene, M.D.     Jones Skene, MD 07/07/12 [phone]

## 2012-07-07 NOTE — Consult Note (Signed)
[facility] STROKE CONSULT NOTE       Chief Complaint: left MCA infarct   HPI:    Sherri Hays is an 61 y.o. female who was initially seen in the ED for dizziness and when about to be discharged home she was found to be unresponsive in the ED.  Patient was admitted to hospital.  EEG was obtained that shoed no epileptiform activity. 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  Patient recalls having episode in ED where she could not find the words to talk to the MD but otherwise cannot give me a good history.    LSN: 07/06/12 tPA Given: No: out of window    Past Medical History  Diagnosis Date  . Hypertension   . Dysphagia   . TIA (transient ischemic attack)   . Genital herpes   . DM neuropathies   . Anemia   . Arthritis   . Asthma   . Cancer     lt breast  . Schizophrenia, simple   . Diabetes mellitus   . Stroke     Past Surgical History  Procedure Date  . Cholecystectomy   . Cesarean section   . Tonsillectomy     History reviewed. No pertinent family history. Social History:  reports that she has been smoking Cigarettes.  She has been smoking about .5 packs per day. She has never used smokeless tobacco. She reports that she does not drink alcohol or use illicit drugs.  Allergies:  Allergies  Allergen Reactions  . Penicillins Other (See Comments)    unknown   Results for orders placed during the hospital encounter of 07/05/12 (from the past 24 hour(s))  GLUCOSE, CAPILLARY     Status: Abnormal   Collection Time   07/06/12  4:15 PM      Component Value Range   Glucose-Capillary 107 (*) 70 - 99 mg/dL  GLUCOSE, CAPILLARY     Status: Abnormal   Collection Time   07/06/12 10:06 PM      Component Value Range   Glucose-Capillary 175 (*) 70 - 99 mg/dL  URINE RAPID DRUG SCREEN (HOSP PERFORMED)     Status: Normal   Collection Time   07/07/12  5:54 AM      Component Value Range   Opiates NONE DETECTED  NONE DETECTED   Cocaine NONE DETECTED  NONE DETECTED   Benzodiazepines NONE DETECTED  NONE DETECTED   Amphetamines NONE DETECTED  NONE DETECTED   Tetrahydrocannabinol NONE DETECTED  NONE DETECTED   Barbiturates NONE DETECTED  NONE DETECTED  GLUCOSE, CAPILLARY     Status: Abnormal   Collection Time   07/07/12  6:11 AM      Component Value Range   Glucose-Capillary 115 (*) 70 - 99 mg/dL   Comment 1 Notify RN    LIPID PANEL     Status: Normal   Collection Time   07/07/12  6:40 AM      Component Value Range   Cholesterol 145  0 - 200 mg/dL   Triglycerides 92  <161 mg/dL   HDL 44  >09 mg/dL   Total CHOL/HDL Ratio 3.3     VLDL 18  0 - 40 mg/dL   LDL Cholesterol 83  0 - 99 mg/dL  GLUCOSE, CAPILLARY  Status: Abnormal   Collection Time   07/07/12 11:32 AM      Component Value Range   Glucose-Capillary 130 (*) 70 - 99 mg/dL   Comment 1 Documented in Chart     Comment 2 Notify RN     Medications:    Prior to Admission:  Prescriptions prior to admission  Medication Sig Dispense Refill  . acetaminophen (TYLENOL) 325 MG tablet Take 2 tablets (650 mg total) by mouth every 6 (six) hours as needed for pain or fever (or Fever >/= 101).      Marland Kitchen albuterol (PROVENTIL HFA;VENTOLIN HFA) 108 (90 BASE) MCG/ACT inhaler Inhale 2 puffs into the lungs every 6 (six) hours as needed. For dyspnea  1 Inhaler  11  . amLODipine (NORVASC) 10 MG tablet Take 10 mg by mouth daily.      Marland Kitchen aspirin 81 MG tablet Take 1 tablet (81 mg total) by mouth daily.  30 tablet    . atorvastatin (LIPITOR) 10 MG tablet Take 1 tablet (10 mg total) by mouth at bedtime. For cholesterol.      . doxycycline (VIBRA-TABS) 100 MG tablet Take 1 tablet (100 mg total) by mouth 2 (two) times daily. For 5 days  10 tablet  0  . fluPHENAZine (PROLIXIN) 5 MG tablet Take 10 mg by mouth at bedtime.      . Fluticasone-Salmeterol (ADVAIR DISKUS) 250-50 MCG/DOSE AEPB  Inhale 1 puff into the lungs 2 (two) times daily. For COPD  60 each  11  . gabapentin (NEURONTIN) 300 MG capsule Take 300 mg by mouth 3 (three) times daily. For anxiety and depression.       . hydrALAZINE (APRESOLINE) 50 MG tablet Take 1 tablet (50 mg total) by mouth 3 (three) times daily. For elevated blood pressure.  90 tablet  0  . insulin glargine (LANTUS) 100 UNIT/ML injection Inject 30 Units into the skin at bedtime. For glycemic control.  10 mL    . metFORMIN (GLUCOPHAGE) 500 MG tablet Take 1 tablet (500 mg total) by mouth 2 (two) times daily with a meal. For glycemic control.      Marland Kitchen oxybutynin (DITROPAN-XL) 10 MG 24 hr tablet Take 1 tablet (10 mg total) by mouth at bedtime. For overactive bladder.  30 tablet  0  . pantoprazole (PROTONIX) 40 MG tablet Take 1 tablet (40 mg total) by mouth daily. For Reflux.      . sertraline (ZOLOFT) 100 MG tablet Take 2 tablets (200 mg total) by mouth daily. For depression and anxiety.  60 tablet  0  . traZODone (DESYREL) 100 MG tablet Take 100 mg by mouth at bedtime.      . trihexyphenidyl (ARTANE) 2 MG tablet Take 0.5 tablets (1 mg total) by mouth at bedtime. For EPS.  15 tablet  0   Scheduled:   . albuterol  2.5 mg Nebulization Q4H  . amLODipine  10 mg Oral Daily  . aspirin EC  81 mg Oral Daily  . atorvastatin  10 mg Oral QHS  . budesonide  0.5 mg Nebulization BID  . doxycycline  100 mg Oral BID  . enoxaparin (LOVENOX) injection  40 mg Subcutaneous Q24H  . fluPHENAZine  10 mg Oral QHS  . Fluticasone-Salmeterol  1 puff Inhalation BID  . gabapentin  300 mg Oral TID  . hydrALAZINE  50 mg Oral TID  . insulin aspart  0-9 Units Subcutaneous TID WC  . insulin glargine  30 Units Subcutaneous QHS  . ipratropium  0.5 mg  Nebulization Q4H  . pantoprazole  40 mg Oral Q1200  . sertraline  200 mg Oral Daily  . traZODone  100 mg Oral QHS  . trihexyphenidyl  1 mg Oral QHS  . DISCONTD: sodium chloride   Intravenous STAT  . DISCONTD: fluPHENAZine decanoate  25  mg Intramuscular Q14 Days  . DISCONTD: haloperidol decanoate  50 mg Intramuscular Q28 days  . DISCONTD: hydrALAZINE  50 mg Oral TID    ROS: Unable to obtain due to poor historian  Physical Examination: Blood pressure 168/94, pulse 74, temperature 98.3 F (36.8 C), temperature source Oral, resp. rate 22, height 5\' 3"  (1.6 m), weight 93.2 kg (205 lb 7.5 oz), SpO2 91.00%.  Neurologic Examination:  Mental Status: Alert, oriented X 3.  Speech fluent without evidence of aphasia. Able to follow simple step commands without difficulty. Memory: poor Thought content appropriate Cranial Nerves: II-Visual fields grossly intact. She will blink to threat bilaterally but when asked to count my fingers she seems to be able to be more accurate with fingers on the right visual field than left.  She also has tendency to guess throughout exam.  III/IV/VI-Extraocular movements intact.  Pupils reactive bilaterally. Ptosis not present. V/VII-Smile symmetric VIII-grossly intact IX/X-normal gag XI-bilateral shoulder shrug XII-midline tongue extension Motor: 5/5 left arm and leg with normal tone and bulk. 4/5 right arm and leg with weakness most pronounced on the tricep and knee flexion. Positive drift right arm and leg. Left arm orbits around right. Sensory: Pinprick and light touch intact throughout, bilaterally Deep Tendon Reflexes:  Right: Upper Extremity   Left: Upper extremity   biceps (C-5 to C-6) 2/4   biceps (C-5 to C-6) 2/4 tricep (C7) 2/4    triceps (C7) 2/4 Brachioradialis (C6) 2/4  Brachioradialis (C6) 2/4  Lower Extremity Lower Extremity  quadriceps (L-2 to L-4) 2/4   quadriceps (L-2 to L-4) 2/4 Achilles (S1) 1/4   Achilles (S1) 1/4      Plantars:      Right:  downgoing     Left:  Downgoing Cerebellar: Normal finger-to-nose,  normal heel-to-shin test.      Lab Results  Component Value Date/Time   CHOL 145 07/07/2012  6:40 AM   TSH 1.699 07/06/2012  7:50 AM   Dg Chest 2  View  07/06/2012  *RADIOLOGY REPORT*  Clinical Data: Lethargy, altered mental status, history hypertension, diabetes, asthma  CHEST - 2 VIEW  Comparison: 05/28/2012  Findings: Minimal enlargement of cardiac silhouette. Slight rotation to the right. Mediastinal contours and pulmonary vascularity normal. Lungs clear. No pleural effusion or pneumothorax. No acute osseous findings.  IMPRESSION: Minimal enlargement of cardiac silhouette. No acute abnormalities.   Original Report Authenticated By: Lollie Marrow, M.D.    Ct Head Wo Contrast  07/06/2012  *RADIOLOGY REPORT*  Clinical Data: Near-syncope, history hypertension, diabetes, stroke  CT HEAD WITHOUT CONTRAST  Technique:  Contiguous axial images were obtained from the base of the skull through the vertex without contrast.  Comparison: 05/27/2012  Findings: Scattered streak artifacts from patient motion despite repeating images, limiting exam. Generalized atrophy. Normal ventricular morphology. No midline shift or mass effect. No gross intracranial hemorrhage or evidence of acute infarction identified though exam is limited by motion and intracranial pathology cannot be excluded. No definite evidence of mass or extra-axial fluid collection. No acute bone or sinus abnormalities.  IMPRESSION: Generalized atrophy. No gross acute intracranial abnormalities identified on exam limited by patient motion artifacts as above.   Original Report Authenticated By: Loraine Leriche  A. Tyron Russell, M.D.    Mri Brain Without Contrast  07/07/2012  *RADIOLOGY REPORT*  Clinical Data: 61 year old female with diabetes hypertension. Decreased mental status.  Dizziness recent cellulitis.  MRI HEAD WITHOUT CONTRAST  Technique:  Multiplanar, multiecho pulse sequences of the brain and surrounding structures were obtained according to standard protocol without intravenous contrast.  Comparison: Head CTs 07/06/2012 and earlier.  Brain MRI 09/13/2010.  Findings: Study is intermittently degraded by motion  artifact despite repeated imaging attempts.  11 mm globular area of restricted diffusion in the left basal ganglia tracking to the left corona radiata.  Additional scattered punctate areas of cortical and subcortical white matter restriction in the left MCA territory.  Early T2 and FLAIR hyperintensity.  The no associated mass effect or hemorrhage.  No diffusion abnormality in the right hemisphere or posterior fossa.  The left ICA flow void is now partially loss at the skull base and through the ICA siphon.  Other Major intracranial vascular flow voids are stable. There is abnormal increased FLAIR signal in the left MCA posterior sylvian division on series 6.  No ventriculomegaly. No midline shift, mass effect, or evidence of mass lesion.  No acute intracranial hemorrhage identified. Negative pituitary, cervicomedullary junction and visualized cervical spine. Stable T2 and FLAIR signal in the brain outside of the acute findings.  Visualized orbit soft tissues are within normal limits.  Visualized paranasal sinuses and mastoids are clear.  The negative scalp soft tissues.  IMPRESSION: 1.  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 2.  No associated mass effect or hemorrhage.  Study discussed by telephone with Dr. Dan Humphreys on 07/07/2012 at 1139 hours.   Original Report Authenticated By: Harley Hallmark, M.D.     EEG-showed no epileptiform activity  Assessment:    61 y.o. 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   Stroke Risk Factors - diabetes mellitus and hypertension  PLAN:  1. CTA  Head and neck to further evaluate left ICA stenosis 2. PT consult, OT consult,  3. Echocardiogram 4. Prophylactic therapy-Antiplatelet med: Plavix - dose 75 mg daily 5. Risk factor  modification  Felicie Morn PA-C [facility] [phone]  07/07/2012, 1:01 PM     I have seen this patient and reviewed the above note. L MCA infarct with possible occlusion of her ICA, will need to further evaluate with CTA to ensure occlusion and not tight stenosis.   Ritta Slot, MD [facility] [phone]

## 2012-07-07 NOTE — Progress Notes (Signed)
[facility] PROGRESS NOTE  Sherri Hays:[id number] DOB: [birth year] DOA: 07/05/2012 PCP: Dorrene German, MD  Assessment/Plan: Principal Problem:  *Encephalopathy acute Active Problems:  DIABETES MELLITUS, TYPE II  HYPERTENSION  ASTHMA  COPD  Schizoaffective disorder, depressive type  Abdominal wall cellulitis  Leukocytosis  1. New acute left MCA distribution infarcts and? Occlusion versus stenosis left ICA at the skull base: Neurology consulted. Complete stroke workup including CT 8 of the head and neck to further evaluate left ICA stenosis as per neurology, 2-D echocardiogram. PT and OT consultation. Changed antiplatelet Plavix. 2. Acute encephalopathy:? Secondary to problem #1. Unsure of patient's baseline mental status but currently alert and oriented to person and place and obeys commands. 3. Near syncope: Apparently this was the reason she was brought to the hospital.? Secondary to problem #1. Continue workup as per problem #1. PT and OT evaluation. 4. History of recent abdominal wall cellulitis: Seems to have resolved. Complete doxycycline. 5. COPD: Stable. Continue Advair, bronchodilator nebulizations 6. DM 2: Reasonable blood sugar control inpatient. Continue Lantus and sliding scale insulin. 7. Schizophrenia: Medications have been resumed. 8. Hypertension: Controlled. Continue amlodipine.   Code Status:  Full Family Communication:  Unable to reach family. Disposition Plan:  Home when stable.   Brief narrative: 61 year-old female with history of diabetes mellitus 2, hypertension, hyperlipidemia, schizophrenia who was just recently discharged after being treated for abdominal wall cellulitis, came to the ER because of dizziness. After being initially evaluated patient about to be discharged when the ER physician found patient to be only responsive to sternal rub. CT head did not show any acute findings, ammonia levels, LFTs and urine drug screen were negative. Patient  was admitted for acute change in mental status.    Consultants:   Neurology  Procedures:   EEG  Antibiotics:   Doxycycline: 9/5  HPI/Subjective: Patient denies any complaints. Keeps smiling.  Objective: Filed Vitals:   07/07/12 0426 07/07/12 0545 07/07/12 1129 07/07/12 1408  BP:  102/35 168/94 130/75  Pulse:  74  80  Temp:  98.3 F (36.8 C)  98.7 F (37.1 C)  TempSrc:  Oral  Oral  Resp:  22  20  Height:      Weight:      SpO2: 100% 91%  92%    Intake/Output Summary (Last 24 hours) at 07/07/12 1713 Last data filed at 07/07/12 1235  Gross per 24 hour  Intake    720 ml  Output    400 ml  Net    320 ml   Filed Weights   07/06/12 0700  Weight: 93.2 kg (205 lb 7.5 oz)    Exam:  General exam: Moderately built and obese female patient lying in bed without distress. Respiratory system: Clear to auscultation. No increased work of breathing. Cardiovascular system: S1 and S2 heard, regular rate and rhythm. No murmurs, JVD, gallops. Telemetry shows sinus rhythm. 1+ bilateral pitting edema. Gastrointestinal system: Abdomen is nondistended, soft and nontender. Normal bowel sounds heard. Central system, alert and oriented to person, place and partly to time. No facial asymmetry, evidence of aphasia or other cranial nerve deficits. Extremities: Grade 5/5 power in the left limbs and? 4/5 power in right limbs versus inadequate patient cooperation.   Data Reviewed: Basic Metabolic Panel:  Lab 07/06/12 8119 07/05/12 2310  NA 139 141  K 3.6 3.6  CL 100 100  CO2 29 --  GLUCOSE 105* 128*  BUN 17 21  CREATININE 0.59 1.00  CALCIUM 9.3 --  MG -- --  PHOS -- --   Liver Function Tests:  Lab 07/06/12 0750 07/06/12 0204  AST 13 13  ALT 15 14  ALKPHOS 87 78  BILITOT 0.2* 0.2*  PROT 7.5 7.3  ALBUMIN 3.4* 3.3*   No results found for this basename: LIPASE:5,AMYLASE:5 in the last 168 hours  Lab 07/06/12 0205  AMMONIA 35   CBC:  Lab 07/06/12 0750 07/06/12 0132  07/05/12 2310 07/02/12 0645  WBC 15.2* 14.3* -- 13.3*  NEUTROABS 8.8* 8.9* -- --  HGB 12.5 11.8* 13.9 12.0  HCT 39.5 36.3 41.0 38.1  MCV 83.2 83.4 -- 82.5  PLT 361 334 -- 375   Cardiac Enzymes: No results found for this basename: CKTOTAL:5,CKMB:5,CKMBINDEX:5,TROPONINI:5 in the last 168 hours BNP (last 3 results)  Basename 03/16/12 0700 01/10/12 2353  PROBNP 330.3* 581.7*   CBG:  Lab 07/07/12 1638 07/07/12 1132 07/07/12 0611 07/06/12 2206 07/06/12 1615  GLUCAP 102* 130* 115* 175* 107*    Recent Results (from the past 240 hour(s))  URINE CULTURE     Status: Normal   Collection Time   06/29/12  7:42 PM      Component Value Range Status Comment   Specimen Description URINE, CLEAN CATCH   Final    Special Requests NONE   Final    Culture  Setup Time 06/29/2012 20:54   Final    Colony Count >=100,000 COLONIES/ML   Final    Culture     Final    Value: Multiple bacterial morphotypes present, none predominant. Suggest appropriate recollection if clinically indicated.   Report Status 06/30/2012 FINAL   Final   URINE CULTURE     Status: Normal   Collection Time   07/02/12 10:32 AM      Component Value Range Status Comment   Specimen Description URINE, CLEAN CATCH   Final    Special Requests NONE   Final    Culture  Setup Time 07/02/2012 21:40   Final    Colony Count NO GROWTH   Final    Culture NO GROWTH   Final    Report Status 07/03/2012 FINAL   Final   MRSA PCR SCREENING     Status: Normal   Collection Time   07/06/12  6:56 AM      Component Value Range Status Comment   MRSA by PCR NEGATIVE  NEGATIVE Final      Studies: Dg Chest 2 View  07/06/2012  *RADIOLOGY REPORT*  Clinical Data: Lethargy, altered mental status, history hypertension, diabetes, asthma  CHEST - 2 VIEW  Comparison: 05/28/2012  Findings: Minimal enlargement of cardiac silhouette. Slight rotation to the right. Mediastinal contours and pulmonary vascularity normal. Lungs clear. No pleural effusion or pneumothorax.  No acute osseous findings.  IMPRESSION: Minimal enlargement of cardiac silhouette. No acute abnormalities.   Original Report Authenticated By: Lollie Marrow, M.D.    Ct Head Wo Contrast  07/06/2012  *RADIOLOGY REPORT*  Clinical Data: Near-syncope, history hypertension, diabetes, stroke  CT HEAD WITHOUT CONTRAST  Technique:  Contiguous axial images were obtained from the base of the skull through the vertex without contrast.  Comparison: 05/27/2012  Findings: Scattered streak artifacts from patient motion despite repeating images, limiting exam. Generalized atrophy. Normal ventricular morphology. No midline shift or mass effect. No gross intracranial hemorrhage or evidence of acute infarction identified though exam is limited by motion and intracranial pathology cannot be excluded. No definite evidence of mass or extra-axial fluid collection. No acute bone or sinus abnormalities.  IMPRESSION: Generalized atrophy. No gross acute intracranial abnormalities identified on exam limited by patient motion artifacts as above.   Original Report Authenticated By: Lollie Marrow, M.D.    Mri Brain Without Contrast  07/07/2012  *RADIOLOGY REPORT*  Clinical Data: 61 year old female with diabetes hypertension. Decreased mental status.  Dizziness recent cellulitis.  MRI HEAD WITHOUT CONTRAST  Technique:  Multiplanar, multiecho pulse sequences of the brain and surrounding structures were obtained according to standard protocol without intravenous contrast.  Comparison: Head CTs 07/06/2012 and earlier.  Brain MRI 09/13/2010.  Findings: Study is intermittently degraded by motion artifact despite repeated imaging attempts.  11 mm globular area of restricted diffusion in the left basal ganglia tracking to the left corona radiata.  Additional scattered punctate areas of cortical and subcortical white matter restriction in the left MCA territory.  Early T2 and FLAIR hyperintensity.  The no associated mass effect or hemorrhage.  No  diffusion abnormality in the right hemisphere or posterior fossa.  The left ICA flow void is now partially loss at the skull base and through the ICA siphon.  Other Major intracranial vascular flow voids are stable. There is abnormal increased FLAIR signal in the left MCA posterior sylvian division on series 6.  No ventriculomegaly. No midline shift, mass effect, or evidence of mass lesion.  No acute intracranial hemorrhage identified. Negative pituitary, cervicomedullary junction and visualized cervical spine. Stable T2 and FLAIR signal in the brain outside of the acute findings.  Visualized orbit soft tissues are within normal limits.  Visualized paranasal sinuses and mastoids are clear.  The negative scalp soft tissues.  IMPRESSION: 1.  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 2.  No associated mass effect or hemorrhage.  Study discussed by telephone with Dr. Dan Humphreys on 07/07/2012 at 1139 hours.   Original Report Authenticated By: Ulla Potash III, M.D.     Scheduled Meds:    . albuterol  2.5 mg Nebulization Q4H  . amLODipine  10 mg Oral Daily  . atorvastatin  10 mg Oral QHS  . budesonide  0.5 mg Nebulization BID  . clopidogrel  75 mg Oral Q breakfast  . doxycycline  100 mg Oral BID  . enoxaparin (LOVENOX) injection  40 mg Subcutaneous Q24H  . fluPHENAZine  10 mg Oral QHS  . Fluticasone-Salmeterol  1 puff Inhalation BID  . gabapentin  300 mg Oral TID  . hydrALAZINE  50 mg Oral TID  . insulin aspart  0-9 Units Subcutaneous TID WC  . insulin glargine  30 Units Subcutaneous QHS  . ipratropium  0.5 mg Nebulization Q4H  . pantoprazole  40 mg Oral Q1200  . sertraline  200 mg Oral Daily  . traZODone  100 mg Oral QHS  . trihexyphenidyl  1 mg Oral QHS  . DISCONTD: sodium chloride   Intravenous STAT  . DISCONTD: aspirin EC  81 mg Oral Daily   Continuous  Infusions:   Time spent:  25 minutes    [facility]  [facility] Pager [phone]. If 8PM-8AM, please contact night-coverage at [email or web address], password [facility] 07/07/2012, 5:13 PM  LOS: 2 days

## 2012-07-07 NOTE — ED Provider Notes (Signed)
History/physical exam/procedure(s) were performed by non-physician practitioner and as supervising physician I was immediately available for consultation/collaboration. I have reviewed all notes and am in agreement with care and plan.   Maryln Eastham S Ulanda Tackett, MD 07/07/12 0119 

## 2012-07-08 DIAGNOSIS — I6529 Occlusion and stenosis of unspecified carotid artery: Secondary | ICD-10-CM | POA: Diagnosis present

## 2012-07-08 DIAGNOSIS — I63239 Cerebral infarction due to unspecified occlusion or stenosis of unspecified carotid arteries: Secondary | ICD-10-CM

## 2012-07-08 DIAGNOSIS — R55 Syncope and collapse: Secondary | ICD-10-CM | POA: Diagnosis present

## 2012-07-08 LAB — LIPID PANEL
Cholesterol: 140 mg/dL (ref 0–200)
LDL Cholesterol: 76 mg/dL (ref 0–99)
Total CHOL/HDL Ratio: 3.3 RATIO
VLDL: 22 mg/dL (ref 0–40)

## 2012-07-08 LAB — CBC
HCT: 34.9 % — ABNORMAL LOW (ref 36.0–46.0)
Hemoglobin: 10.9 g/dL — ABNORMAL LOW (ref 12.0–15.0)
MCH: 26.1 pg (ref 26.0–34.0)
MCHC: 31.2 g/dL (ref 30.0–36.0)
MCV: 83.5 fL (ref 78.0–100.0)

## 2012-07-08 LAB — HEMOGLOBIN A1C
Hgb A1c MFr Bld: 7.1 % — ABNORMAL HIGH (ref <5.7)
Mean Plasma Glucose: 157 mg/dL — ABNORMAL HIGH (ref <117)

## 2012-07-08 MED ORDER — ALBUTEROL SULFATE (5 MG/ML) 0.5% IN NEBU
2.5000 mg | INHALATION_SOLUTION | Freq: Two times a day (BID) | RESPIRATORY_TRACT | Status: DC
  Administered 2012-07-09: 2.5 mg via RESPIRATORY_TRACT
  Filled 2012-07-08: qty 0.5

## 2012-07-08 MED ORDER — ALBUTEROL SULFATE (5 MG/ML) 0.5% IN NEBU: 2.5000 mg | INHALATION_SOLUTION | Freq: Two times a day (BID) | RESPIRATORY_TRACT | Status: DC

## 2012-07-08 MED ORDER — ASPIRIN 81 MG PO CHEW
81.0000 mg | CHEWABLE_TABLET | Freq: Every day | ORAL | Status: DC
  Administered 2012-07-08 – 2012-07-09 (×2): 81 mg via ORAL
  Filled 2012-07-08: qty 1

## 2012-07-08 MED ORDER — IPRATROPIUM BROMIDE 0.02 % IN SOLN
0.5000 mg | Freq: Two times a day (BID) | RESPIRATORY_TRACT | Status: DC
  Administered 2012-07-09: 0.5 mg via RESPIRATORY_TRACT
  Filled 2012-07-08: qty 2.5

## 2012-07-08 NOTE — Plan of Care (Signed)
Problem: Phase III Progression Outcomes Goal: Pain controlled on oral analgesia Outcome: Completed/Met Date Met:  07/08/12 Pain well controled with meds. Pain at patients tolerable level. Will continue to monitor and intervene if necessary. Denies current pain.    Goal: Activity at appropriate level-compared to baseline (UP IN CHAIR FOR HEMODIALYSIS)  Outcome: Not Progressing Pt unstable on her feet. Will evaluate need for PT and OT consult. Goal: Voiding independently Outcome: Completed/Met Date Met:  07/08/12 Voiding without difficulty. Denies any burning or difficulty voiding. Goal: IV/normal saline lock discontinued Outcome: Completed/Met Date Met:  07/08/12 Saline locked. Drinking and voiding adequately.

## 2012-07-08 NOTE — Progress Notes (Signed)
[facility] PROGRESS NOTE  Sherri Hays ZOX:[id number] DOB: 09/27/1951 DOA: 07/05/2012 PCP: Dorrene German, MD  Assessment/Plan: Principal Problem:  *CVA (cerebral infarction) Active Problems:  DIABETES MELLITUS, TYPE II  HYPERTENSION  ASTHMA  COPD  Schizoaffective disorder, depressive type  Abdominal wall cellulitis  Encephalopathy acute  Leukocytosis  1. 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 They recommend aspirin and add Plavix daily for 90 days for secondary stroke prevention. PT has seen and recommend a rolling walker with 5 inch wheels. 2. 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  3. Acute encephalopathy:? Secondary to problem #1. Unsure of patient's baseline mental status but has significantly improved since admission was probably at baseline.  4. Near syncope: Apparently this was the reason she was brought to the hospital.? Secondary to problem #1. Workup complete. Home on discharge. 5. History of recent abdominal wall cellulitis: Seems to have resolved. Complete doxycycline. 6. COPD: Stable. Continue Advair, bronchodilator nebulizations 7. DM 2: Reasonable blood sugar control inpatient. Continue Lantus and sliding scale insulin. Hemoglobin A1c: 6.8-7.1-will need tighter control. 8. Schizophrenia: Medications have been resumed. 9. Hypertension: Controlled. Continue amlodipine. 10. Chronic ununited C1 ring fracture. Discussed with Dr. Jeral Fruit, neurosurgeon on call who reviewed patient's CT scans and opined that she has had this chronic fracture at least since November of 2011 and did not recommend any interventions at this time. 11. Anemia: Follow CBCs tomorrow.    Code Status:  Full Family Communication:  Unable to reach family- tried 9/7. Disposition Plan:  Home  possibly 9/8.    Brief narrative: 61 year-old female with history of diabetes mellitus 2, hypertension, hyperlipidemia, schizophrenia who was just recently discharged after being treated for abdominal wall cellulitis, came to the ER because of dizziness. After being initially evaluated patient about to be discharged when the ER physician found patient to be only responsive to sternal rub. CT head did not show any acute findings, ammonia levels, LFTs and urine drug screen were negative. Patient was admitted for acute change in mental status.    Consultants:   Neurology  Vascular surgery.  Procedures:   EEG  Antibiotics:   Doxycycline: 9/5  HPI/Subjective: Patient denies complaints. She denies headache or neck pain. She indicates that she has known about fracture in her neck bone for a long time but cannot say if she seen anyone for it.  Objective: Filed Vitals:   07/07/12 2021 07/08/12 0502 07/08/12 0754 07/08/12 1226  BP: 153/72 164/58    Pulse: 78 73    Temp: 98.9 F (37.2 C) 98.6 F (37 C)    TempSrc: Oral Oral    Resp: 20 18    Height:      Weight:      SpO2: 99% 97% 97% 97%    Intake/Output Summary (Last 24 hours) at 07/08/12 1413 Last data filed at 07/08/12 1223  Gross per 24 hour  Intake    960 ml  Output   1300 ml  Net   -340 ml   Filed Weights   07/06/12 0700  Weight: 93.2 kg (205 lb 7.5 oz)    Exam:  General exam: Moderately built and obese female sitting up in chair eating lunch, pleasant and comfortable and in no obvious distress. Respiratory system: Clear to auscultation. No increased work of breathing. Cardiovascular system: S1 and S2 heard, regular rate and  rhythm. No murmurs, JVD, gallops. Telemetry shows sinus rhythm. 1+ bilateral pitting edema. Gastrointestinal system: Abdomen is nondistended, soft and nontender. Normal bowel sounds heard. Central system, alert and oriented to person, place and partly to time. No facial asymmetry, evidence of  aphasia or other cranial nerve deficits. Extremities: Grade 5/5 power in the left limbs and? 4/5 power in right limbs versus inadequate patient cooperation.   Data Reviewed: Basic Metabolic Panel:  Lab 07/06/12 1610 07/05/12 2310  NA 139 141  K 3.6 3.6  CL 100 100  CO2 29 --  GLUCOSE 105* 128*  BUN 17 21  CREATININE 0.59 1.00  CALCIUM 9.3 --  MG -- --  PHOS -- --   Liver Function Tests:  Lab 07/06/12 0750 07/06/12 0204  AST 13 13  ALT 15 14  ALKPHOS 87 78  BILITOT 0.2* 0.2*  PROT 7.5 7.3  ALBUMIN 3.4* 3.3*   No results found for this basename: LIPASE:5,AMYLASE:5 in the last 168 hours  Lab 07/06/12 0205  AMMONIA 35   CBC:  Lab 07/08/12 0635 07/06/12 0750 07/06/12 0132 07/05/12 2310 07/02/12 0645  WBC 11.1* 15.2* 14.3* -- 13.3*  NEUTROABS -- 8.8* 8.9* -- --  HGB 10.9* 12.5 11.8* 13.9 12.0  HCT 34.9* 39.5 36.3 41.0 38.1  MCV 83.5 83.2 83.4 -- 82.5  PLT 351 361 334 -- 375   Cardiac Enzymes: No results found for this basename: CKTOTAL:5,CKMB:5,CKMBINDEX:5,TROPONINI:5 in the last 168 hours BNP (last 3 results)  Basename 03/16/12 0700 01/10/12 2353  PROBNP 330.3* 581.7*   CBG:  Lab 07/08/12 1118 07/08/12 0635 07/07/12 2101 07/07/12 1638 07/07/12 1132  GLUCAP 177* 113* 122* 102* 130*    Recent Results (from the past 240 hour(s))  URINE CULTURE     Status: Normal   Collection Time   06/29/12  7:42 PM      Component Value Range Status Comment   Specimen Description URINE, CLEAN CATCH   Final    Special Requests NONE   Final    Culture  Setup Time 06/29/2012 20:54   Final    Colony Count >=100,000 COLONIES/ML   Final    Culture     Final    Value: Multiple bacterial morphotypes present, none predominant. Suggest appropriate recollection if clinically indicated.   Report Status 06/30/2012 FINAL   Final   URINE CULTURE     Status: Normal   Collection Time   07/02/12 10:32 AM      Component Value Range Status Comment   Specimen Description URINE, CLEAN CATCH    Final    Special Requests NONE   Final    Culture  Setup Time 07/02/2012 21:40   Final    Colony Count NO GROWTH   Final    Culture NO GROWTH   Final    Report Status 07/03/2012 FINAL   Final   MRSA PCR SCREENING     Status: Normal   Collection Time   07/06/12  6:56 AM      Component Value Range Status Comment   MRSA by PCR NEGATIVE  NEGATIVE Final   URINE CULTURE     Status: Normal (Preliminary result)   Collection Time   07/07/12  5:54 AM      Component Value Range Status Comment   Specimen Description URINE, CLEAN CATCH   Final    Special Requests NONE   Final    Culture  Setup Time 07/07/2012 08:49   Final    Colony Count >=100,000 COLONIES/ML  Final    Culture GRAM NEGATIVE RODS   Final    Report Status PENDING   Incomplete      Studies: Dg Chest 2 View  07/06/2012  *RADIOLOGY REPORT*  Clinical Data: Lethargy, altered mental status, history hypertension, diabetes, asthma  CHEST - 2 VIEW  Comparison: 05/28/2012  Findings: Minimal enlargement of cardiac silhouette. Slight rotation to the right. Mediastinal contours and pulmonary vascularity normal. Lungs clear. No pleural effusion or pneumothorax. No acute osseous findings.  IMPRESSION: Minimal enlargement of cardiac silhouette. No acute abnormalities.   Original Report Authenticated By: Lollie Marrow, M.D.    Ct Head Wo Contrast  07/06/2012  *RADIOLOGY REPORT*  Clinical Data: Near-syncope, history hypertension, diabetes, stroke  CT HEAD WITHOUT CONTRAST  Technique:  Contiguous axial images were obtained from the base of the skull through the vertex without contrast.  Comparison: 05/27/2012  Findings: Scattered streak artifacts from patient motion despite repeating images, limiting exam. Generalized atrophy. Normal ventricular morphology. No midline shift or mass effect. No gross intracranial hemorrhage or evidence of acute infarction identified though exam is limited by motion and intracranial pathology cannot be excluded. No definite  evidence of mass or extra-axial fluid collection. No acute bone or sinus abnormalities.  IMPRESSION: Generalized atrophy. No gross acute intracranial abnormalities identified on exam limited by patient motion artifacts as above.   Original Report Authenticated By: Lollie Marrow, M.D.    Mri Brain Without Contrast  07/07/2012  *RADIOLOGY REPORT*  Clinical Data: 61 year old female with diabetes hypertension. Decreased mental status.  Dizziness recent cellulitis.  MRI HEAD WITHOUT CONTRAST  Technique:  Multiplanar, multiecho pulse sequences of the brain and surrounding structures were obtained according to standard protocol without intravenous contrast.  Comparison: Head CTs 07/06/2012 and earlier.  Brain MRI 09/13/2010.  Findings: Study is intermittently degraded by motion artifact despite repeated imaging attempts.  11 mm globular area of restricted diffusion in the left basal ganglia tracking to the left corona radiata.  Additional scattered punctate areas of cortical and subcortical white matter restriction in the left MCA territory.  Early T2 and FLAIR hyperintensity.  The no associated mass effect or hemorrhage.  No diffusion abnormality in the right hemisphere or posterior fossa.  The left ICA flow void is now partially loss at the skull base and through the ICA siphon.  Other Major intracranial vascular flow voids are stable. There is abnormal increased FLAIR signal in the left MCA posterior sylvian division on series 6.  No ventriculomegaly. No midline shift, mass effect, or evidence of mass lesion.  No acute intracranial hemorrhage identified. Negative pituitary, cervicomedullary junction and visualized cervical spine. Stable T2 and FLAIR signal in the brain outside of the acute findings.  Visualized orbit soft tissues are within normal limits.  Visualized paranasal sinuses and mastoids are clear.  The negative scalp soft tissues.  IMPRESSION: 1.  Evidence of new poor flow or occlusion of the left ICA at  the skull base with associated scattered small acute left MCA territory infarcts, most confluent at the left basal ganglia. 2.  No associated mass effect or hemorrhage.  Study discussed by telephone with Dr. Dan Humphreys on 07/07/2012 at 1139 hours.   Original Report Authenticated By: Harley Hallmark, M.D.    CTA neck  IMPRESSION:  1. Left ICA occluded at its origin with no reconstituted flow in  the neck.  2. High-grade stenosis of the right ICA at the level of the distal  bulb, 75% stenosis with respect the distal vessel.  3. High-grade stenosis of the proximal right common carotid artery  estimated at 65-70%.  4. Dominant left vertebral artery with mild if any stenosis at its  origin. Diminutive right vertebral artery throughout the neck.  5. See intracranial findings below.  6. Chronic ununited C1 ring fracture.  CT head  IMPRESSION:  1. Thrombosed left ICA siphon. Some reconstituted flow in the  left supraclinoid ICA, such that the left ICA terminus, left MCA,  and left ACA origins remain patent.  2. Patent but attenuated left MCA major branches compared to those  on the right.  3. Symmetric ACA flow.  4. Distal ACA stenosis. Right MCA branches within normal limits.  5. Posterior circulation mild atherosclerosis with no major branch  occlusion.  6. Acute left MCA infarcts seen earlier are not well visualized by  CT. No mass effect or hemorrhage.  2-D echo  Study Conclusions  - Left ventricle: The cavity size was normal. Wall thickness was increased in a pattern of mild LVH. Systolic function was normal. The estimated ejection fraction was in the range of 60% to 65%. Wall motion was normal; there were no regional wall motion abnormalities. Doppler parameters are consistent with abnormal left ventricular relaxation (grade 1 diastolic dysfunction). - Pericardium, extracardiac: A trivial pericardial effusion was identified.    Scheduled Meds:    . albuterol  2.5 mg  Nebulization QID  . amLODipine  10 mg Oral Daily  . aspirin  81 mg Oral Daily  . atorvastatin  10 mg Oral QHS  . budesonide  0.5 mg Nebulization BID  . clopidogrel  75 mg Oral Q breakfast  . doxycycline  100 mg Oral BID  . enoxaparin (LOVENOX) injection  40 mg Subcutaneous Q24H  . fluPHENAZine  10 mg Oral QHS  . Fluticasone-Salmeterol  1 puff Inhalation BID  . gabapentin  300 mg Oral TID  . hydrALAZINE  50 mg Oral TID  . insulin aspart  0-9 Units Subcutaneous TID WC  . insulin glargine  30 Units Subcutaneous QHS  . ipratropium  0.5 mg Nebulization QID  . pantoprazole  40 mg Oral Q1200  . sertraline  200 mg Oral Daily  . traZODone  100 mg Oral QHS  . trihexyphenidyl  1 mg Oral QHS  . DISCONTD: albuterol  2.5 mg Nebulization Q4H  . DISCONTD: ipratropium  0.5 mg Nebulization Q4H   Continuous Infusions:   Time spent:  25 minutes    [facility]  [facility] Pager [phone]. If 8PM-8AM, please contact night-coverage at [email or web address], password [facility] 07/08/2012, 2:13 PM  LOS: 3 days

## 2012-07-08 NOTE — Evaluation (Signed)
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 Pt currently at baseline functional status without any new strength or sensation deficits. Pt states she has had trouble with her right hip for over a month and that her boyfriend has been assisting with ADLS for a long time. Pt educated for rolling walker use and agrees that she is functioning at her normal and doesn't need further therapy. Will sign off.     PT Assessment  Patent does not need any further PT services    Follow Up Recommendations  No PT follow up    Barriers to Discharge        Equipment Recommendations  Rolling walker with 5" wheels    Recommendations for Other Services     Frequency      Precautions / Restrictions Precautions Precautions: Fall Precaution Comments: pt reports 2-3 in last year   Pertinent Vitals/Pain No pain      Mobility  Bed Mobility Bed Mobility: Supine to Sit;Sit to Supine Supine to Sit: 6: Modified independent (Device/Increase time);HOB flat Sit to Supine: 6: Modified independent (Device/Increase time) Details for Bed Mobility Assistance: increased time to complete Transfers Transfers: Sit to Stand;Stand to Sit Sit to Stand: 6: Modified independent (Device/Increase time);From bed Stand to Sit: 5: Supervision;To chair/3-in-1 Details for Transfer Assistance: cueing to control descent to surface Ambulation/Gait Ambulation/Gait Assistance: 5: Supervision Ambulation Distance (Feet): 300 Feet Assistive device: Rolling walker Ambulation/Gait Assistance Details: cueing for RW use, pt tends to angle RW so it doesnt  roll straight on wheels  Gait Pattern: Step-through pattern Gait velocity: decreased Stairs: Yes Stairs Assistance: 5: Supervision Stair  Management Technique: One rail Left;Step to pattern Number of Stairs: 2     Exercises     PT Diagnosis:    PT Problem List:   PT Treatment Interventions:     PT Goals    Visit Information  Last PT Received On: 07/08/12 Assistance Needed: +1    Subjective Data  Subjective: the doctor said I had a stroke Patient Stated Goal: go home with my boyfriend   Prior Functioning  Home Living Lives With: Other (Comment) (boyfriend) Available Help at Discharge: Friend(s);Available 24 hours/day Type of Home: Apartment Home Access: Stairs to enter Entrance Stairs-Number of Steps: 2 Entrance Stairs-Rails: Left Home Layout: One level Bathroom Shower/Tub: Health visitor: Standard Home Adaptive Equipment: None Prior Function Level of Independence: Needs assistance Needs Assistance: Light Housekeeping;Meal Prep;Bathing;Dressing Bath: Minimal Dressing: Minimal Meal Prep: Total Light Housekeeping: Total Able to Take Stairs?: Yes Driving: No Vocation: On disability Comments: boyfriend helps with dressing and sponge bathing Communication Communication: No difficulties Dominant Hand: Left    Cognition  Overall Cognitive Status: History of cognitive impairments - at baseline Arousal/Alertness: Awake/alert Orientation Level: Appears intact for tasks assessed Behavior During Session: [facility] for tasks performed    Extremity/Trunk Assessment Right Upper Extremity Assessment RUE ROM/Strength/Tone: Within functional levels Left Upper Extremity Assessment LUE ROM/Strength/Tone: Within functional levels LUE Sensation: WFL - Light Touch LUE Coordination: WFL - gross/fine motor Right Lower Extremity Assessment RLE ROM/Strength/Tone: Deficits RLE ROM/Strength/Tone Deficits: R hip flexion 3/5 knee flexion/ extension 4/5 RLE Sensation: WFL - Light Touch RLE Coordination: WFL - gross/fine motor Left Lower Extremity Assessment LLE ROM/Strength/Tone: Within functional levels (4/5  throughout) LLE Sensation: WFL -  Light Touch LLE Coordination: WFL - gross/fine motor Trunk Assessment Trunk Assessment: Normal   Balance Balance Balance Assessed:  (not formally assessed due to pt reporting falls and need for)  End of Session PT - End of Session Equipment Utilized During Treatment: Gait belt Activity Tolerance: Patient tolerated treatment well Patient left: in chair;with call bell/phone within reach Nurse Communication: Mobility status  GP     Delorse Lek 07/08/2012, 11:10 AM  Delaney Meigs, PT [phone]

## 2012-07-08 NOTE — Consult Note (Signed)
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  HPI: Sherri Hays is a 61 y.o. female who presents for evaluation of left brain CVA. Patient was admitted for nights ago after coming to the emergency department with abdominal pain having been treated for cellulitis on the abdominal wall. She had a syncopal episode and was admitted to the hospital. Evaluation revealed by MRI scanning evidence of a left middle cerebral artery distribution stroke. CT angiogram was performed which I have reviewed. This revealed left ICA occlusion and moderately severe right ICA stenosis. She denies any previous history of specific left brain or right brain TIAs. She does say she had a stroke many years ago but is not specific about this. She was ambulating prior to her admission to the hospital she states.   Past Medical History  Diagnosis Date  . Hypertension   . Dysphagia   . TIA (transient ischemic attack)   . Genital herpes   . DM neuropathies   . Anemia   . Arthritis   . Asthma   . Cancer     lt breast  . Schizophrenia, simple   . Diabetes mellitus   . Stroke    Past Surgical History  Procedure Date  . Cholecystectomy   . Cesarean section   . Tonsillectomy    History   Social History  . Marital Status: Single    Spouse Name: N/A    Number of Children: N/A  . Years of Education: N/A   Social History Main Topics  . Smoking status: Current Everyday Smoker -- 0.5 packs/day    Types: Cigarettes  . Smokeless tobacco: Never Used  . Alcohol Use: No  . Drug Use: No  . Sexually Active: No   Other Topics Concern  . None   Social History Narrative  . None   History reviewed. No pertinent family history. Allergies  Allergen Reactions  . Penicillins Other (See Comments)    unknown   Prior to Admission medications   Medication Sig Start Date End Date Taking? Authorizing Provider    albuterol (PROVENTIL HFA;VENTOLIN HFA) 108 (90 BASE) MCG/ACT inhaler Inhale 2 puffs into the lungs every 6 (six) hours as needed. For dyspnea 03/20/12  Yes Alison Murray, MD  amLODipine (NORVASC) 10 MG tablet Take 10 mg by mouth daily.   Yes Historical Provider, MD  aspirin 81 MG tablet Take 1 tablet (81 mg total) by mouth daily. 07/02/12 07/02/13 Yes Zannie Cove, MD  atorvastatin (LIPITOR) 10 MG tablet Take 1 tablet (10 mg total) by mouth at bedtime. For cholesterol. 05/22/12  Yes Verne Spurr, PA-C  diclofenac (VOLTAREN) 75 MG EC tablet Take 75 mg by mouth 2 (two) times daily.   Yes Historical Provider, MD  doxycycline (VIBRA-TABS) 100 MG tablet Take 1 tablet (100 mg total) by mouth 2 (two) times daily. For 5 days 07/02/12 07/12/12 Yes Zannie Cove, MD  fluPHENAZine (PROLIXIN) 5 MG tablet Take 10 mg by mouth at bedtime.   Yes Historical Provider, MD  Fluticasone-Salmeterol (ADVAIR DISKUS) 250-50 MCG/DOSE AEPB Inhale 1 puff into the lungs 2 (two) times daily. For COPD 05/22/12  Yes Verne Spurr, PA-C  gabapentin (NEURONTIN) 300 MG capsule Take 300 mg by mouth 3 (three) times daily. For anxiety and depression.  05/22/12  Yes Verne Spurr, PA-C  hydrALAZINE (APRESOLINE) 50 MG tablet Take 1 tablet (50 mg total) by mouth 3 (three) times daily. For elevated blood pressure. 05/22/12 05/22/13  Yes Verne Spurr, PA-C  insulin glargine (LANTUS) 100 UNIT/ML injection Inject 30 Units into the skin at bedtime. For glycemic control. 05/22/12  Yes Verne Spurr, PA-C  metFORMIN (GLUCOPHAGE) 500 MG tablet Take 1 tablet (500 mg total) by mouth 2 (two) times daily with a meal. For glycemic control. 05/22/12  Yes Verne Spurr, PA-C  oxybutynin (DITROPAN-XL) 10 MG 24 hr tablet Take 1 tablet (10 mg total) by mouth at bedtime. For overactive bladder. 05/22/12 05/22/13 Yes Neil Mashburn, PA-C  sertraline (ZOLOFT) 100 MG tablet Take 2 tablets (200 mg total) by mouth daily. For depression and anxiety. 05/22/12 05/22/13 Yes Verne Spurr, PA-C  traZODone (DESYREL) 100 MG tablet Take 100 mg by mouth at bedtime.   Yes Historical Provider, MD  trihexyphenidyl (ARTANE) 2 MG tablet Take 0.5 tablets (1 mg total) by mouth at bedtime. For EPS. 05/22/12 08/20/12 Yes Verne Spurr, PA-C  acetaminophen (TYLENOL) 325 MG tablet Take 2 tablets (650 mg total) by mouth every 6 (six) hours as needed for pain or fever (or Fever >/= 101). 07/02/12 07/02/13  Zannie Cove, MD     Positive ROS: Patient states she does have occasional chest discomfort. States she had a heart attack in [place] about 5 years ago but this is not documented in the history and physical. Denies history of nonhealing ulcers in lower extremities.  All other systems have been reviewed and were otherwise negative with the exception of those mentioned in the HPI and as above.  Physical Exam: Filed Vitals:   07/08/12 0502  BP: 164/58  Pulse: 73  Temp: 98.6 F (37 C)  Resp: 18    General: Alert, no acute distress HEENT: Normal for age Cardiovascular: Regular rate and rhythm. Carotid pulses 2+, no bruits audible Respiratory: Clear to auscultation. No cyanosis, no use of accessory musculature GI: No organomegaly, abdomen is soft and non-tender-abdomen obese-no evidence of cellulitis at present time Skin: No lesions in the area of chief complaint Neurologic: Sensation intact distally Psychiatric: Patient is competent for consent with normal mood and affect Musculoskeletal: No obvious deformities Extremities: 3+ femoral pulses bilaterally. Both feet well perfused. 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   Assessment/Plan:  61 year old female with multiple medical problems with recent left middle cerebral artery distribution stroke and now with left ICA occlusion. Unclear when occlusion occurred but could have been precipitating factor for acute  left brain stroke. Patient's neurologic symptoms are minimal at most at the present time essentially back to baseline.  The patient will need right carotid endarterectomy in approximately 4 weeks to allow time for decrease intracerebral edema from acute left brain stroke-particularly since right ICA stenosis is not severe and is asymptomatic  I believe patient should have Myoview to rule out silent coronary artery disease since it is unclear whether she had previous cardiac event. This could be done as an outpatient. I will see patient tomorrow and arrange for surgery in approximately 4 weeks if Myoview could be obtained in the interim.   [name], MD 07/08/2012 3:05 PM

## 2012-07-08 NOTE — Progress Notes (Signed)
Stroke Team Progress Note  HISTORY Sherri Hays is an 61 y.o. female who was initially seen in the ED for dizziness and when about to be discharged home she was found to be unresponsive in the ED. Patient was admitted to hospital. EEG was obtained that shoed no epileptiform activity. 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 Patient recalls having episode in ED where she could not find the words to talk to the MD but otherwise cannot give me a good history.   SUBJECTIVE No family is at the bedside. Overall she feels her condition is improved. The patient feels as if she is at baseline.  OBJECTIVE Most recent Vital Signs: Temp: 98.6 F (37 C) (09/07 0502) Temp src: Oral (09/07 0502) BP: 164/58 mmHg (09/07 0502) Pulse Rate: 73  (09/07 0502) Respiratory Rate: 18 O2 Saturation: 97%  CBG (last 3)  Basename 07/08/12 0635 07/07/12 2101 07/07/12 1638  GLUCAP 113* 122* 102*   Intake/Output from previous day: 09/06 0701 - 09/07 0700 In: 1080 [P.O.:1080] Out: 1300 [Urine:1300]  IV Fluid Intake:    Medications    . albuterol  2.5 mg Nebulization QID  . amLODipine  10 mg Oral Daily  . atorvastatin  10 mg Oral QHS  . budesonide  0.5 mg Nebulization BID  . clopidogrel  75 mg Oral Q breakfast  . doxycycline  100 mg Oral BID  . enoxaparin (LOVENOX) injection  40 mg Subcutaneous Q24H  . fluPHENAZine  10 mg Oral QHS  . Fluticasone-Salmeterol  1 puff Inhalation BID  . gabapentin  300 mg Oral TID  . hydrALAZINE  50 mg Oral TID  . insulin aspart  0-9 Units Subcutaneous TID WC  . insulin glargine  30 Units Subcutaneous QHS  . ipratropium  0.5 mg Nebulization QID  . pantoprazole  40 mg Oral Q1200  . sertraline  200 mg Oral Daily  . traZODone  100 mg Oral QHS  . trihexyphenidyl  1 mg Oral QHS  . DISCONTD: albuterol  2.5 mg Nebulization Q4H  . DISCONTD: aspirin  EC  81 mg Oral Daily  . DISCONTD: ipratropium  0.5 mg Nebulization Q4H  PRN albuterol, iohexol  Diet:  Carb Control thin liquids Activity:  Up with assistance DVT Prophylaxis:  lovenox  Significant Diagnostic Studies: CBC    Component Value Date/Time   WBC 11.1* 07/08/2012 0635   RBC 4.18 07/08/2012 0635   HGB 10.9* 07/08/2012 0635   HCT 34.9* 07/08/2012 0635   PLT 351 07/08/2012 0635   MCV 83.5 07/08/2012 0635   MCH 26.1 07/08/2012 0635   MCHC 31.2 07/08/2012 0635   RDW 17.5* 07/08/2012 0635   LYMPHSABS 4.5* 07/06/2012 0750   MONOABS 1.4* 07/06/2012 0750   EOSABS 0.4 07/06/2012 0750   BASOSABS 0.1 07/06/2012 0750   CMP    Component Value Date/Time   NA 139 07/06/2012 0750   K 3.6 07/06/2012 0750   CL 100 07/06/2012 0750   CO2 29 07/06/2012 0750   GLUCOSE 105* 07/06/2012 0750   BUN 17 07/06/2012 0750   CREATININE 0.59 07/06/2012 0750   CALCIUM 9.3 07/06/2012 0750   PROT 7.5 07/06/2012 0750   ALBUMIN 3.4* 07/06/2012 0750   AST 13 07/06/2012 0750   ALT 15 07/06/2012 0750   ALKPHOS 87 07/06/2012 0750   BILITOT 0.2* 07/06/2012 0750   GFRNONAA >90 07/06/2012 0750  GFRAA >90 07/06/2012 0750   COAGS Lab Results  Component Value Date   INR 0.91 05/28/2012   INR 0.94 01/23/2012   INR 0.99 09/12/2010   Lipid Panel    Component Value Date/Time   CHOL 140 07/08/2012 0635   TRIG 109 07/08/2012 0635   HDL 42 07/08/2012 0635   CHOLHDL 3.3 07/08/2012 0635   VLDL 22 07/08/2012 0635   LDLCALC 76 07/08/2012 0635   HgbA1C  Lab Results  Component Value Date   HGBA1C 6.8* 07/06/2012   Urine Drug Screen    Component Value Date/Time   LABOPIA NONE DETECTED 07/07/2012 0554   COCAINSCRNUR NONE DETECTED 07/07/2012 0554   LABBENZ NONE DETECTED 07/07/2012 0554   AMPHETMU NONE DETECTED 07/07/2012 0554   THCU NONE DETECTED 07/07/2012 0554   LABBARB NONE DETECTED 07/07/2012 0554    Alcohol Level    Component Value Date/Time   ETH <11 05/11/2012 1637     Results for orders placed during the hospital encounter of 07/05/12 (from the past 24 hour(s))    GLUCOSE, CAPILLARY     Status: Abnormal   Collection Time   07/07/12 11:32 AM      Component Value Range   Glucose-Capillary 130 (*) 70 - 99 mg/dL   Comment 1 Documented in Chart     Comment 2 Notify RN    GLUCOSE, CAPILLARY     Status: Abnormal   Collection Time   07/07/12  4:38 PM      Component Value Range   Glucose-Capillary 102 (*) 70 - 99 mg/dL   Comment 1 Documented in Chart     Comment 2 Notify RN    GLUCOSE, CAPILLARY     Status: Abnormal   Collection Time   07/07/12  9:01 PM      Component Value Range   Glucose-Capillary 122 (*) 70 - 99 mg/dL  CBC     Status: Abnormal   Collection Time   07/08/12  6:35 AM      Component Value Range   WBC 11.1 (*) 4.0 - 10.5 K/uL   RBC 4.18  3.87 - 5.11 MIL/uL   Hemoglobin 10.9 (*) 12.0 - 15.0 g/dL   HCT 40.9 (*) 81.1 - 91.4 %   MCV 83.5  78.0 - 100.0 fL   MCH 26.1  26.0 - 34.0 pg   MCHC 31.2  30.0 - 36.0 g/dL   RDW 78.2 (*) 95.6 - 21.3 %   Platelets 351  150 - 400 K/uL  LIPID PANEL     Status: Normal   Collection Time   07/08/12  6:35 AM      Component Value Range   Cholesterol 140  0 - 200 mg/dL   Triglycerides 086  <578 mg/dL   HDL 42  >46 mg/dL   Total CHOL/HDL Ratio 3.3     VLDL 22  0 - 40 mg/dL   LDL Cholesterol 76  0 - 99 mg/dL  GLUCOSE, CAPILLARY     Status: Abnormal   Collection Time   07/08/12  6:35 AM      Component Value Range   Glucose-Capillary 113 (*) 70 - 99 mg/dL    Ct Angio Head W/cm &/or Wo Cm  07/07/2012  *RADIOLOGY REPORT*  Clinical Data:  61 year old female with evidence of poor flow or occlusion of the left ICA at the skull base on recent MRI with scattered small acute left MCA territory infarcts.  CT ANGIOGRAPHY HEAD AND NECK  Technique:  Multidetector CT imaging  of the head and neck was performed using the standard protocol during bolus administration of intravenous contrast.  Multiplanar CT image reconstructions including MIPs were obtained to evaluate the vascular anatomy. Carotid stenosis measurements (when  applicable) are obtained utilizing NASCET criteria, using the distal internal carotid diameter as the denominator.  Contrast: 50mL OMNIPAQUE IOHEXOL 350 MG/ML SOLN  Comparison:  Brain MRI from earlier the same day.  CTA NECK  Findings:  Mild pulmonary atelectasis.  Atelectatic changes to the trachea.  No superior mediastinal lymphadenopathy.  Negative thyroid, larynx, pharynx, parapharyngeal spaces, retropharyngeal space, sublingual space, submandibular glands and parotid glands. Visualized orbit soft tissues are within normal limits.  Visualized paranasal sinuses and mastoids are clear.  Chronic appearing C1 multi part fracture, ununited.  No definite acute osseous abnormality.  No cervical lymphadenopathy.  Vascular Findings: Moderate arch atherosclerosis.  Bovine arch configuration.  Great vessel origins are patent.  Soft plaque just beyond the right common carotid artery origin resulting in stenosis of 65-70 % with respect to the distal vessel. Tortuous right common carotid artery. Soft and calcified plaque at the right carotid bifurcation.  High- grade stenosis at the level of the distal bulb measuring 75 % with respect to the distal vessel.    Tortuous more distal cervical right ICA.  Calcified plaque at the skull base, but no additional hemodynamically significant stenosis, see intracranial findings below.  Nondominant right vertebral artery is patent.  Its origin is partially obscured by streak from large body habitus and venous contrast.  No definite right vertebral artery stenosis in the neck.  Left vertebral artery origin is patent with mild if any stenosis. The left vertebral artery is dominant and patent throughout the neck with no other abnormality.  The proximal left common carotid artery is mildly irregular, less than that on the right and without definite hemodynamically significant stenosis.  There is calcified and low density plaque at the left ICA origin which is occluded.  There is no  reconstituted flow in the cervical left ICA or proximal left ICA siphon. Intracranial findings below.   Review of the MIP images confirms the above findings.  IMPRESSION: 1.  Left ICA occluded at its origin with no reconstituted flow in the neck. 2.  High-grade stenosis of the right ICA at the level of the distal bulb, 75% stenosis with respect the distal vessel. 3.  High-grade stenosis of the proximal right common carotid artery estimated at 65-70%. 4.  Dominant left vertebral artery with mild if any stenosis at its origin.  Diminutive right vertebral artery throughout the neck. 5.  See intracranial findings below. 6.  Chronic ununited C1 ring fracture.  CTA HEAD  Findings:  Left MCA territory infarcts seen earlier today are not well visualized by CT. No acute intracranial hemorrhage identified. No midline shift, mass effect, or evidence of mass lesion.  No ventriculomegaly. No abnormal enhancement identified.  No acute osseous abnormality identified.  Vascular Findings: Dominant distal left vertebral artery is patent with mild atherosclerosis.  Diminutive distal right vertebral artery.  Both AICA appeared dominant.  The vertebrobasilar junction is patent.  The basilar artery is irregular but without hemodynamically significant stenosis.  SCA and PCA origins are patent.  Mild irregularity of the bilateral PCA branches. Posterior communicating arteries are diminutive or absent.  Right ICA siphon is patent.  There is severe calcified plaque in the right cavernous segment encasing the vessel such that the lumen is only faintly visible (axial image 193 sagittal image 64). Radiographic string  sign results. Calcified plaque abate in the supraclinoid segment.  Right ophthalmic artery origin not visualized.  Right ICA terminus is patent. Probable infundibulum at the right anterior choroidal artery.  Right MCA and ACA origins are patent.  Right MCA branches are within normal limits. Anterior communicating artery is present.   Bilateral ACA branches are normal except for stenosis in a distal pericallosal vessel.  No enhancement of the proximal left ICA siphon.  No enhancement of the cavernous segment.  Reconstituted flow first seen in the anterior genu and supraclinoid ICA segment.  Diminutive but patent left ICA terminus.  Diminutive but patent left MCA and ACA origins. Left MCA M1 segment is patent.  Major left MCA branches are patent but diminutive compared to those on the right.  No major left MCA branch occlusion.   Review of the MIP images confirms the above findings.  IMPRESSION: 1.  Thrombosed left ICA siphon.  Some reconstituted flow in the left supraclinoid ICA, such that the left ICA terminus, left MCA, and left ACA origins remain patent. 2.  Patent but attenuated left MCA major branches compared to those on the right. 3.  Symmetric ACA flow. 4.  Distal ACA stenosis.  Right MCA branches within normal limits. 5.  Posterior circulation mild atherosclerosis with no major branch occlusion. 6.  Acute left MCA infarcts seen earlier are not well visualized by CT.  No mass effect or hemorrhage.   Original Report Authenticated By: Harley Hallmark, M.D.    Ct Angio Neck W/cm &/or Wo/cm  07/07/2012  *RADIOLOGY REPORT*  Clinical Data:  61 year old female with evidence of poor flow or occlusion of the left ICA at the skull base on recent MRI with scattered small acute left MCA territory infarcts.  CT ANGIOGRAPHY HEAD AND NECK  Technique:  Multidetector CT imaging of the head and neck was performed using the standard protocol during bolus administration of intravenous contrast.  Multiplanar CT image reconstructions including MIPs were obtained to evaluate the vascular anatomy. Carotid stenosis measurements (when applicable) are obtained utilizing NASCET criteria, using the distal internal carotid diameter as the denominator.  Contrast: 50mL OMNIPAQUE IOHEXOL 350 MG/ML SOLN  Comparison:  Brain MRI from earlier the same day.  CTA NECK   Findings:  Mild pulmonary atelectasis.  Atelectatic changes to the trachea.  No superior mediastinal lymphadenopathy.  Negative thyroid, larynx, pharynx, parapharyngeal spaces, retropharyngeal space, sublingual space, submandibular glands and parotid glands. Visualized orbit soft tissues are within normal limits.  Visualized paranasal sinuses and mastoids are clear.  Chronic appearing C1 multi part fracture, ununited.  No definite acute osseous abnormality.  No cervical lymphadenopathy.  Vascular Findings: Moderate arch atherosclerosis.  Bovine arch configuration.  Great vessel origins are patent.  Soft plaque just beyond the right common carotid artery origin resulting in stenosis of 65-70 % with respect to the distal vessel. Tortuous right common carotid artery. Soft and calcified plaque at the right carotid bifurcation.  High- grade stenosis at the level of the distal bulb measuring 75 % with respect to the distal vessel.    Tortuous more distal cervical right ICA.  Calcified plaque at the skull base, but no additional hemodynamically significant stenosis, see intracranial findings below.  Nondominant right vertebral artery is patent.  Its origin is partially obscured by streak from large body habitus and venous contrast.  No definite right vertebral artery stenosis in the neck.  Left vertebral artery origin is patent with mild if any stenosis. The left vertebral artery is dominant  and patent throughout the neck with no other abnormality.  The proximal left common carotid artery is mildly irregular, less than that on the right and without definite hemodynamically significant stenosis.  There is calcified and low density plaque at the left ICA origin which is occluded.  There is no reconstituted flow in the cervical left ICA or proximal left ICA siphon. Intracranial findings below.   Review of the MIP images confirms the above findings.  IMPRESSION: 1.  Left ICA occluded at its origin with no reconstituted flow in  the neck. 2.  High-grade stenosis of the right ICA at the level of the distal bulb, 75% stenosis with respect the distal vessel. 3.  High-grade stenosis of the proximal right common carotid artery estimated at 65-70%. 4.  Dominant left vertebral artery with mild if any stenosis at its origin.  Diminutive right vertebral artery throughout the neck. 5.  See intracranial findings below. 6.  Chronic ununited C1 ring fracture.  CTA HEAD  Findings:  Left MCA territory infarcts seen earlier today are not well visualized by CT. No acute intracranial hemorrhage identified. No midline shift, mass effect, or evidence of mass lesion.  No ventriculomegaly. No abnormal enhancement identified.  No acute osseous abnormality identified.  Vascular Findings: Dominant distal left vertebral artery is patent with mild atherosclerosis.  Diminutive distal right vertebral artery.  Both AICA appeared dominant.  The vertebrobasilar junction is patent.  The basilar artery is irregular but without hemodynamically significant stenosis.  SCA and PCA origins are patent.  Mild irregularity of the bilateral PCA branches. Posterior communicating arteries are diminutive or absent.  Right ICA siphon is patent.  There is severe calcified plaque in the right cavernous segment encasing the vessel such that the lumen is only faintly visible (axial image 193 sagittal image 64). Radiographic string sign results. Calcified plaque abate in the supraclinoid segment.  Right ophthalmic artery origin not visualized.  Right ICA terminus is patent. Probable infundibulum at the right anterior choroidal artery.  Right MCA and ACA origins are patent.  Right MCA branches are within normal limits. Anterior communicating artery is present.  Bilateral ACA branches are normal except for stenosis in a distal pericallosal vessel.  No enhancement of the proximal left ICA siphon.  No enhancement of the cavernous segment.  Reconstituted flow first seen in the anterior genu and  supraclinoid ICA segment.  Diminutive but patent left ICA terminus.  Diminutive but patent left MCA and ACA origins. Left MCA M1 segment is patent.  Major left MCA branches are patent but diminutive compared to those on the right.  No major left MCA branch occlusion.   Review of the MIP images confirms the above findings.  IMPRESSION: 1.  Thrombosed left ICA siphon.  Some reconstituted flow in the left supraclinoid ICA, such that the left ICA terminus, left MCA, and left ACA origins remain patent. 2.  Patent but attenuated left MCA major branches compared to those on the right. 3.  Symmetric ACA flow. 4.  Distal ACA stenosis.  Right MCA branches within normal limits. 5.  Posterior circulation mild atherosclerosis with no major branch occlusion. 6.  Acute left MCA infarcts seen earlier are not well visualized by CT.  No mass effect or hemorrhage.   Original Report Authenticated By: Harley Hallmark, M.D.    Mri Brain Without Contrast  07/07/2012  *RADIOLOGY REPORT*  Clinical Data: 61 year old female with diabetes hypertension. Decreased mental status.  Dizziness recent cellulitis.  MRI HEAD WITHOUT CONTRAST  Technique:  Multiplanar, multiecho pulse sequences of the brain and surrounding structures were obtained according to standard protocol without intravenous contrast.  Comparison: Head CTs 07/06/2012 and earlier.  Brain MRI 09/13/2010.  Findings: Study is intermittently degraded by motion artifact despite repeated imaging attempts.  11 mm globular area of restricted diffusion in the left basal ganglia tracking to the left corona radiata.  Additional scattered punctate areas of cortical and subcortical white matter restriction in the left MCA territory.  Early T2 and FLAIR hyperintensity.  The no associated mass effect or hemorrhage.  No diffusion abnormality in the right hemisphere or posterior fossa.  The left ICA flow void is now partially loss at the skull base and through the ICA siphon.  Other Major  intracranial vascular flow voids are stable. There is abnormal increased FLAIR signal in the left MCA posterior sylvian division on series 6.  No ventriculomegaly. No midline shift, mass effect, or evidence of mass lesion.  No acute intracranial hemorrhage identified. Negative pituitary, cervicomedullary junction and visualized cervical spine. Stable T2 and FLAIR signal in the brain outside of the acute findings.  Visualized orbit soft tissues are within normal limits.  Visualized paranasal sinuses and mastoids are clear.  The negative scalp soft tissues.  IMPRESSION: 1.  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 2.  No associated mass effect or hemorrhage.  Study discussed by telephone with Dr. Dan Humphreys on 07/07/2012 at 1139 hours.   Original Report Authenticated By: Harley Hallmark, M.D.     CT of the brain   IMPRESSION:  Generalized atrophy.  No gross acute intracranial abnormalities identified on exam   CT angio   IMPRESSION:  1. Left ICA occluded at its origin with no reconstituted flow in  the neck.  2. High-grade stenosis of the right ICA at the level of the distal  bulb, 75% stenosis with respect the distal vessel.  3. High-grade stenosis of the proximal right common carotid artery  estimated at 65-70%.  4. Dominant left vertebral artery with mild if any stenosis at its  origin. Diminutive right vertebral artery throughout the neck.  5. See intracranial findings below.  6. Chronic ununited C1 ring fracture.  IMPRESSION:  1. Thrombosed left ICA siphon. Some reconstituted flow in the  left supraclinoid ICA, such that the left ICA terminus, left MCA,  and left ACA origins remain patent.  2. Patent but attenuated left MCA major branches compared to those  on the right.  3. Symmetric ACA flow.  4. Distal ACA stenosis. Right MCA branches within  normal limits.  5. Posterior circulation mild atherosclerosis with no major branch  occlusion.  6. Acute left MCA infarcts seen earlier are not well visualized by  CT. No mass effect or hemorrhage.   MRI of the brain   IMPRESSION:  1. Evidence of new poor flow or occlusion of the left ICA at the  skull base with associated scattered small acute left MCA territory  infarcts, most confluent at the left basal ganglia.  2. No associated mass effect or hemorrhage.   MRA of the brain  Not ordered  2D Echocardiogram   Study Conclusions  - Left ventricle: The cavity size was normal. Wall thickness was increased in a pattern of mild LVH. Systolic function was normal. The estimated ejection fraction was in the range of 60% to 65%. Wall motion was  normal; there were no regional wall motion abnormalities. Doppler parameters are consistent with abnormal left ventricular relaxation (grade 1 diastolic dysfunction). - Pericardium, extracardiac: A trivial pericardial effusion was identified.    Carotid Doppler  Not done.  CXR   IMPRESSION:  Minimal enlargement of cardiac silhouette.  No acute abnormalities.   EKG  NSR  Physical Exam   The patient is alert and cooperative.  Neurologic exam reveals full extraocular movements, speech is normal. Visual fields are full.  Motor testing reveals good strength of all four extremities.  The patient has good finger-nose-finger and heel-to-shin bilaterally. Gait was not tested.  Deep tendon reflexes are symmetric and normal. Toes are down going bilaterally.    ASSESSMENT Ms. Sherri Hays is a 61 y.o. female with a left brain stroke, secondary to left ICA occlusion. On aspirin 81 mg orally every day for secondary stroke prevention.  Stroke risk factors:  carotid stenosis, diabetes mellitus and hypertension  Hospital day # 3  TREATMENT/PLAN Continue aspirin 81 mg orally every day and clopidogrel 75 mg orally every day for secondary  stroke prevention.    1. Left internal carotid artery occlusion  2. 75% stenosis right internal carotid artery  3. 65-70% stenosis of the right common carotid artery  4. Diabetes  5. Hypertension  This patient has multiple risk factors for stroke. The patient did sustain a deep left brain stroke secondary to the left carotid artery occlusion. The patient has high-grade stenosis of the right internal carotid artery as well, and this patient should be considered for a possible right carotid endarterectomy. At this point, stroke workup is essentially complete. Physical therapy believes that the patient is at her baseline, walking with a walker.  -Add Plavix to therapy for 90 days -Consult vascular surgery, Dr. Hart Rochester to see -Followup with [facility] in 6-8 weeks   Lesly Dukes

## 2012-07-09 LAB — GLUCOSE, CAPILLARY: Glucose-Capillary: 111 mg/dL — ABNORMAL HIGH (ref 70–99)

## 2012-07-09 LAB — CBC
HCT: 35.3 % — ABNORMAL LOW (ref 36.0–46.0)
Hemoglobin: 11.2 g/dL — ABNORMAL LOW (ref 12.0–15.0)
MCH: 26.5 pg (ref 26.0–34.0)
MCHC: 31.7 g/dL (ref 30.0–36.0)
MCV: 83.6 fL (ref 78.0–100.0)
RDW: 17.5 % — ABNORMAL HIGH (ref 11.5–15.5)

## 2012-07-09 LAB — URINE CULTURE: Colony Count: >=100000

## 2012-07-09 MED ORDER — CLOPIDOGREL BISULFATE 75 MG PO TABS: 75.0000 mg | ORAL_TABLET | Freq: Every day | ORAL | Status: DC

## 2012-07-09 MED ORDER — CLOPIDOGREL BISULFATE 75 MG PO TABS: 75.0000 mg | ORAL_TABLET | Freq: Every day | ORAL | Status: AC

## 2012-07-09 NOTE — Progress Notes (Signed)
Spoke with patien'st boyfriend, he will have a ride available to pick up patient around 5 this afternoon.

## 2012-07-09 NOTE — Progress Notes (Signed)
Patient ID: Sherri Hays, female   DOB: Sep 11, 1951, 61 y.o.   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 patient very lethargic today-is arousable but tends to want to go back to sleep Neurologic exam unchanged   Labs:  Lab 07/06/12 0750 07/05/12 2310  CREATININE 0.59 1.00    Lab 07/06/12 0750 07/05/12 2310  NA 139 141  K 3.6 3.6  CL 100 100  CO2 29 --  BUN 17 21  CREATININE 0.59 1.00  LABGLOM -- --  GLUCOSE 105* --  CALCIUM 9.3 --    Lab 07/09/12 0730 07/08/12 0635 07/06/12 0750  WBC 11.9* 11.1* 15.2*  HGB 11.2* 10.9* 12.5  HCT 35.3* 34.9* 39.5  PLT 372 351 361   No results found for this basename: INR:3 in the last 168 hours  I/O last 3 completed shifts: In: 600 [P.O.:600] Out: 950 [Urine:950]  Imaging: Ct Angio Head W/cm &/or Wo Cm  07/07/2012  *RADIOLOGY REPORT*  Clinical Data:  61 year old female with evidence of poor flow or occlusion of the left ICA at the skull base on recent MRI with scattered small acute left MCA territory infarcts.  CT ANGIOGRAPHY HEAD AND NECK  Technique:  Multidetector CT imaging of the head and neck was performed using the standard protocol during bolus administration of intravenous contrast.  Multiplanar CT image reconstructions including MIPs were obtained to evaluate the vascular anatomy. Carotid stenosis measurements (when applicable) are obtained utilizing NASCET criteria, using the distal internal carotid diameter as the denominator.  Contrast: 50mL OMNIPAQUE IOHEXOL 350 MG/ML SOLN  Comparison:  Brain MRI from earlier the same day.  CTA NECK  Findings:  Mild pulmonary atelectasis.  Atelectatic changes to the trachea.  No superior mediastinal lymphadenopathy.  Negative thyroid, larynx, pharynx,  parapharyngeal spaces, retropharyngeal space, sublingual space, submandibular glands and parotid glands. Visualized orbit soft tissues are within normal limits.  Visualized paranasal sinuses and mastoids are clear.  Chronic appearing C1 multi part fracture, ununited.  No definite acute osseous abnormality.  No cervical lymphadenopathy.  Vascular Findings: Moderate arch atherosclerosis.  Bovine arch configuration.  Great vessel origins are patent.  Soft plaque just beyond the right common carotid artery origin resulting in stenosis of 65-70 % with respect to the distal vessel. Tortuous right common carotid artery. Soft and calcified plaque at the right carotid bifurcation.  High- grade stenosis at the level of the distal bulb measuring 75 % with respect to the distal vessel.    Tortuous more distal cervical right ICA.  Calcified plaque at the skull base, but no additional hemodynamically significant stenosis, see intracranial findings below.  Nondominant right vertebral artery is patent.  Its origin is partially obscured by streak from large body habitus and venous contrast.  No definite right vertebral artery stenosis in the neck.  Left vertebral artery origin is patent with mild if any stenosis. The left vertebral artery is dominant and patent throughout the neck with no other abnormality.  The proximal left common carotid artery is mildly irregular, less than that on the right and without definite hemodynamically significant stenosis.  There is calcified and low density plaque at the left ICA origin which is occluded.  There is no reconstituted flow in the cervical left ICA  or proximal left ICA siphon. Intracranial findings below.   Review of the MIP images confirms the above findings.  IMPRESSION: 1.  Left ICA occluded at its origin with no reconstituted flow in the neck. 2.  High-grade stenosis of the right ICA at the level of the distal bulb, 75% stenosis with respect the distal vessel. 3.  High-grade stenosis  of the proximal right common carotid artery estimated at 65-70%. 4.  Dominant left vertebral artery with mild if any stenosis at its origin.  Diminutive right vertebral artery throughout the neck. 5.  See intracranial findings below. 6.  Chronic ununited C1 ring fracture.  CTA HEAD  Findings:  Left MCA territory infarcts seen earlier today are not well visualized by CT. No acute intracranial hemorrhage identified. No midline shift, mass effect, or evidence of mass lesion.  No ventriculomegaly. No abnormal enhancement identified.  No acute osseous abnormality identified.  Vascular Findings: Dominant distal left vertebral artery is patent with mild atherosclerosis.  Diminutive distal right vertebral artery.  Both AICA appeared dominant.  The vertebrobasilar junction is patent.  The basilar artery is irregular but without hemodynamically significant stenosis.  SCA and PCA origins are patent.  Mild irregularity of the bilateral PCA branches. Posterior communicating arteries are diminutive or absent.  Right ICA siphon is patent.  There is severe calcified plaque in the right cavernous segment encasing the vessel such that the lumen is only faintly visible (axial image 193 sagittal image 64). Radiographic string sign results. Calcified plaque abate in the supraclinoid segment.  Right ophthalmic artery origin not visualized.  Right ICA terminus is patent. Probable infundibulum at the right anterior choroidal artery.  Right MCA and ACA origins are patent.  Right MCA branches are within normal limits. Anterior communicating artery is present.  Bilateral ACA branches are normal except for stenosis in a distal pericallosal vessel.  No enhancement of the proximal left ICA siphon.  No enhancement of the cavernous segment.  Reconstituted flow first seen in the anterior genu and supraclinoid ICA segment.  Diminutive but patent left ICA terminus.  Diminutive but patent left MCA and ACA origins. Left MCA M1 segment is patent.  Major  left MCA branches are patent but diminutive compared to those on the right.  No major left MCA branch occlusion.   Review of the MIP images confirms the above findings.  IMPRESSION: 1.  Thrombosed left ICA siphon.  Some reconstituted flow in the left supraclinoid ICA, such that the left ICA terminus, left MCA, and left ACA origins remain patent. 2.  Patent but attenuated left MCA major branches compared to those on the right. 3.  Symmetric ACA flow. 4.  Distal ACA stenosis.  Right MCA branches within normal limits. 5.  Posterior circulation mild atherosclerosis with no major branch occlusion. 6.  Acute left MCA infarcts seen earlier are not well visualized by CT.  No mass effect or hemorrhage.   Original Report Authenticated By: Harley Hallmark, M.D.    Ct Angio Neck W/cm &/or Wo/cm  07/07/2012  *RADIOLOGY REPORT*  Clinical Data:  60 year old female with evidence of poor flow or occlusion of the left ICA at the skull base on recent MRI with scattered small acute left MCA territory infarcts.  CT ANGIOGRAPHY HEAD AND NECK  Technique:  Multidetector CT imaging of the head and neck was performed using the standard protocol during bolus administration of intravenous contrast.  Multiplanar CT image reconstructions including MIPs were obtained to evaluate the vascular anatomy. Carotid stenosis measurements (  when applicable) are obtained utilizing NASCET criteria, using the distal internal carotid diameter as the denominator.  Contrast: 50mL OMNIPAQUE IOHEXOL 350 MG/ML SOLN  Comparison:  Brain MRI from earlier the same day.  CTA NECK  Findings:  Mild pulmonary atelectasis.  Atelectatic changes to the trachea.  No superior mediastinal lymphadenopathy.  Negative thyroid, larynx, pharynx, parapharyngeal spaces, retropharyngeal space, sublingual space, submandibular glands and parotid glands. Visualized orbit soft tissues are within normal limits.  Visualized paranasal sinuses and mastoids are clear.  Chronic appearing C1  multi part fracture, ununited.  No definite acute osseous abnormality.  No cervical lymphadenopathy.  Vascular Findings: Moderate arch atherosclerosis.  Bovine arch configuration.  Great vessel origins are patent.  Soft plaque just beyond the right common carotid artery origin resulting in stenosis of 65-70 % with respect to the distal vessel. Tortuous right common carotid artery. Soft and calcified plaque at the right carotid bifurcation.  High- grade stenosis at the level of the distal bulb measuring 75 % with respect to the distal vessel.    Tortuous more distal cervical right ICA.  Calcified plaque at the skull base, but no additional hemodynamically significant stenosis, see intracranial findings below.  Nondominant right vertebral artery is patent.  Its origin is partially obscured by streak from large body habitus and venous contrast.  No definite right vertebral artery stenosis in the neck.  Left vertebral artery origin is patent with mild if any stenosis. The left vertebral artery is dominant and patent throughout the neck with no other abnormality.  The proximal left common carotid artery is mildly irregular, less than that on the right and without definite hemodynamically significant stenosis.  There is calcified and low density plaque at the left ICA origin which is occluded.  There is no reconstituted flow in the cervical left ICA or proximal left ICA siphon. Intracranial findings below.   Review of the MIP images confirms the above findings.  IMPRESSION: 1.  Left ICA occluded at its origin with no reconstituted flow in the neck. 2.  High-grade stenosis of the right ICA at the level of the distal bulb, 75% stenosis with respect the distal vessel. 3.  High-grade stenosis of the proximal right common carotid artery estimated at 65-70%. 4.  Dominant left vertebral artery with mild if any stenosis at its origin.  Diminutive right vertebral artery throughout the neck. 5.  See intracranial findings below. 6.   Chronic ununited C1 ring fracture.  CTA HEAD  Findings:  Left MCA territory infarcts seen earlier today are not well visualized by CT. No acute intracranial hemorrhage identified. No midline shift, mass effect, or evidence of mass lesion.  No ventriculomegaly. No abnormal enhancement identified.  No acute osseous abnormality identified.  Vascular Findings: Dominant distal left vertebral artery is patent with mild atherosclerosis.  Diminutive distal right vertebral artery.  Both AICA appeared dominant.  The vertebrobasilar junction is patent.  The basilar artery is irregular but without hemodynamically significant stenosis.  SCA and PCA origins are patent.  Mild irregularity of the bilateral PCA branches. Posterior communicating arteries are diminutive or absent.  Right ICA siphon is patent.  There is severe calcified plaque in the right cavernous segment encasing the vessel such that the lumen is only faintly visible (axial image 193 sagittal image 64). Radiographic string sign results. Calcified plaque abate in the supraclinoid segment.  Right ophthalmic artery origin not visualized.  Right ICA terminus is patent. Probable infundibulum at the right anterior choroidal artery.  Right MCA and  ACA origins are patent.  Right MCA branches are within normal limits. Anterior communicating artery is present.  Bilateral ACA branches are normal except for stenosis in a distal pericallosal vessel.  No enhancement of the proximal left ICA siphon.  No enhancement of the cavernous segment.  Reconstituted flow first seen in the anterior genu and supraclinoid ICA segment.  Diminutive but patent left ICA terminus.  Diminutive but patent left MCA and ACA origins. Left MCA M1 segment is patent.  Major left MCA branches are patent but diminutive compared to those on the right.  No major left MCA branch occlusion.   Review of the MIP images confirms the above findings.  IMPRESSION: 1.  Thrombosed left ICA siphon.  Some reconstituted  flow in the left supraclinoid ICA, such that the left ICA terminus, left MCA, and left ACA origins remain patent. 2.  Patent but attenuated left MCA major branches compared to those on the right. 3.  Symmetric ACA flow. 4.  Distal ACA stenosis.  Right MCA branches within normal limits. 5.  Posterior circulation mild atherosclerosis with no major branch occlusion. 6.  Acute left MCA infarcts seen earlier are not well visualized by CT.  No mass effect or hemorrhage.   Original Report Authenticated By: Harley Hallmark, M.D.    Mri Brain Without Contrast  07/07/2012  *RADIOLOGY REPORT*  Clinical Data: 61 year old female with diabetes hypertension. Decreased mental status.  Dizziness recent cellulitis.  MRI HEAD WITHOUT CONTRAST  Technique:  Multiplanar, multiecho pulse sequences of the brain and surrounding structures were obtained according to standard protocol without intravenous contrast.  Comparison: Head CTs 07/06/2012 and earlier.  Brain MRI 09/13/2010.  Findings: Study is intermittently degraded by motion artifact despite repeated imaging attempts.  11 mm globular area of restricted diffusion in the left basal ganglia tracking to the left corona radiata.  Additional scattered punctate areas of cortical and subcortical white matter restriction in the left MCA territory.  Early T2 and FLAIR hyperintensity.  The no associated mass effect or hemorrhage.  No diffusion abnormality in the right hemisphere or posterior fossa.  The left ICA flow void is now partially loss at the skull base and through the ICA siphon.  Other Major intracranial vascular flow voids are stable. There is abnormal increased FLAIR signal in the left MCA posterior sylvian division on series 6.  No ventriculomegaly. No midline shift, mass effect, or evidence of mass lesion.  No acute intracranial hemorrhage identified. Negative pituitary, cervicomedullary junction and visualized cervical spine. Stable T2 and FLAIR signal in the brain outside of  the acute findings.  Visualized orbit soft tissues are within normal limits.  Visualized paranasal sinuses and mastoids are clear.  The negative scalp soft tissues.  IMPRESSION: 1.  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 2.  No associated mass effect or hemorrhage.  Study discussed by telephone with Dr. Dan Humphreys on 07/07/2012 at 1139 hours.   Original Report Authenticated By: Harley Hallmark, M.D.     Assessment/Plan:   LOS: 4 days  s/p   As noted in consult note-patient will need right carotid endarterectomy in 4-5 weeks. Moderate right internal carotid stenosis is asymptomatic but in view of left ICA occlusion believe she should have right CE Patient does need nuclear medicine cardiac study preoperatively Okay to DC patient home when felt appropriate and I will arrange  for her surgery in 4-5 weeks   [name], MD 07/09/2012 9:35 AM

## 2012-07-09 NOTE — Plan of Care (Signed)
Problem: Phase III Progression Outcomes Goal: Activity at appropriate level-compared to baseline (UP IN CHAIR FOR HEMODIALYSIS)  Outcome: Progressing Pt continues to need stand by assist with BSC. Will encourage to ambulate within room and increase activity to establish baseline.  Goal: Discharge plan remains appropriate-arrangements made Outcome: Progressing Pt will probably need some rehab after hospitalization. Will coordinate with PT and OT to establish level of activity. Goal: Other Phase III Outcomes/Goals Outcome: Progressing Pt's speech continues to show improvements. Pt stating she feels much better now than when she came through the ED.

## 2012-07-09 NOTE — Progress Notes (Signed)
Patient discharge instructions reviewed with patient and family friend, all questions answered, prescription for plavix given. Patient discharged home with rolling walker.

## 2012-07-09 NOTE — Progress Notes (Signed)
Stroke Team Progress Note  HISTORY Sherri Hays is an 60 y.o. female who was initially seen in the ED for dizziness and when about to be discharged home she was found to be unresponsive in the ED. Patient was admitted to hospital. EEG was obtained that shoed no epileptiform activity. 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 Patient recalls having episode in ED where she could not find the words to talk to the MD but otherwise cannot give me a good history.   SUBJECTIVE No family is at the bedside. Overall she feels her condition is improved. The patient feels as if she is at baseline.  OBJECTIVE Most recent Vital Signs: Temp: 98.6 F (37 C) (09/08 0430) Temp src: Oral (09/08 0430) BP: 148/63 mmHg (09/08 0430) Pulse Rate: 73  (09/08 0430) Respiratory Rate: 18 O2 Saturation: 90%  CBG (last 3)   Basename 07/09/12 0616 07/08/12 2114 07/08/12 1632  GLUCAP 111* 113* 105*   Intake/Output from previous day: 09/07 0701 - 09/08 0700 In: 600 [P.O.:600] Out: 350 [Urine:350]  IV Fluid Intake:    Medications     . albuterol  2.5 mg Nebulization BID  . amLODipine  10 mg Oral Daily  . aspirin  81 mg Oral Daily  . atorvastatin  10 mg Oral QHS  . budesonide  0.5 mg Nebulization BID  . clopidogrel  75 mg Oral Q breakfast  . doxycycline  100 mg Oral BID  . enoxaparin (LOVENOX) injection  40 mg Subcutaneous Q24H  . fluPHENAZine  10 mg Oral QHS  . Fluticasone-Salmeterol  1 puff Inhalation BID  . gabapentin  300 mg Oral TID  . hydrALAZINE  50 mg Oral TID  . insulin aspart  0-9 Units Subcutaneous TID WC  . insulin glargine  30 Units Subcutaneous QHS  . ipratropium  0.5 mg Nebulization BID  . pantoprazole  40 mg Oral Q1200  . sertraline  200 mg Oral Daily  . traZODone  100 mg Oral QHS  . trihexyphenidyl  1 mg Oral QHS  . DISCONTD: albuterol  2.5 mg  Nebulization QID  . DISCONTD: albuterol  2.5 mg Nebulization BID  . DISCONTD: ipratropium  0.5 mg Nebulization QID  PRN albuterol  Diet:  Carb Control thin liquids Activity:  Up with assistance DVT Prophylaxis:  lovenox  Significant Diagnostic Studies: CBC    Component Value Date/Time   WBC 11.9* 07/09/2012 0730   RBC 4.22 07/09/2012 0730   HGB 11.2* 07/09/2012 0730   HCT 35.3* 07/09/2012 0730   PLT 372 07/09/2012 0730   MCV 83.6 07/09/2012 0730   MCH 26.5 07/09/2012 0730   MCHC 31.7 07/09/2012 0730   RDW 17.5* 07/09/2012 0730   LYMPHSABS 4.5* 07/06/2012 0750   MONOABS 1.4* 07/06/2012 0750   EOSABS 0.4 07/06/2012 0750   BASOSABS 0.1 07/06/2012 0750   CMP    Component Value Date/Time   NA 139 07/06/2012 0750   K 3.6 07/06/2012 0750   CL 100 07/06/2012 0750   CO2 29 07/06/2012 0750   GLUCOSE 105* 07/06/2012 0750   BUN 17 07/06/2012 0750   CREATININE 0.59 07/06/2012 0750   CALCIUM 9.3 07/06/2012 0750   PROT 7.5 07/06/2012 0750   ALBUMIN 3.4* 07/06/2012 0750   AST 13 07/06/2012 0750   ALT 15 07/06/2012 0750   ALKPHOS 87 07/06/2012 0750   BILITOT 0.2*  07/06/2012 0750   GFRNONAA >90 07/06/2012 0750   GFRAA >90 07/06/2012 0750   COAGS Lab Results  Component Value Date   INR 0.91 05/28/2012   INR 0.94 01/23/2012   INR 0.99 09/12/2010   Lipid Panel    Component Value Date/Time   CHOL 140 07/08/2012 0635   TRIG 109 07/08/2012 0635   HDL 42 07/08/2012 0635   CHOLHDL 3.3 07/08/2012 0635   VLDL 22 07/08/2012 0635   LDLCALC 76 07/08/2012 0635   HgbA1C  Lab Results  Component Value Date   HGBA1C 7.1* 07/07/2012   Urine Drug Screen    Component Value Date/Time   LABOPIA NONE DETECTED 07/07/2012 0554   COCAINSCRNUR NONE DETECTED 07/07/2012 0554   LABBENZ NONE DETECTED 07/07/2012 0554   AMPHETMU NONE DETECTED 07/07/2012 0554   THCU NONE DETECTED 07/07/2012 0554   LABBARB NONE DETECTED 07/07/2012 0554    Alcohol Level    Component Value Date/Time   ETH <11 05/11/2012 1637     Results for orders placed during the hospital encounter of  07/05/12 (from the past 24 hour(s))  GLUCOSE, CAPILLARY     Status: Abnormal   Collection Time   07/08/12 11:18 AM      Component Value Range   Glucose-Capillary 177 (*) 70 - 99 mg/dL  GLUCOSE, CAPILLARY     Status: Abnormal   Collection Time   07/08/12  4:32 PM      Component Value Range   Glucose-Capillary 105 (*) 70 - 99 mg/dL  GLUCOSE, CAPILLARY     Status: Abnormal   Collection Time   07/08/12  9:14 PM      Component Value Range   Glucose-Capillary 113 (*) 70 - 99 mg/dL  GLUCOSE, CAPILLARY     Status: Abnormal   Collection Time   07/09/12  6:16 AM      Component Value Range   Glucose-Capillary 111 (*) 70 - 99 mg/dL  CBC     Status: Abnormal   Collection Time   07/09/12  7:30 AM      Component Value Range   WBC 11.9 (*) 4.0 - 10.5 K/uL   RBC 4.22  3.87 - 5.11 MIL/uL   Hemoglobin 11.2 (*) 12.0 - 15.0 g/dL   HCT 16.1 (*) 09.6 - 04.5 %   MCV 83.6  78.0 - 100.0 fL   MCH 26.5  26.0 - 34.0 pg   MCHC 31.7  30.0 - 36.0 g/dL   RDW 40.9 (*) 81.1 - 91.4 %   Platelets 372  150 - 400 K/uL    Ct Angio Head W/cm &/or Wo Cm  07/07/2012  *RADIOLOGY REPORT*  Clinical Data:  61 year old female with evidence of poor flow or occlusion of the left ICA at the skull base on recent MRI with scattered small acute left MCA territory infarcts.  CT ANGIOGRAPHY HEAD AND NECK  Technique:  Multidetector CT imaging of the head and neck was performed using the standard protocol during bolus administration of intravenous contrast.  Multiplanar CT image reconstructions including MIPs were obtained to evaluate the vascular anatomy. Carotid stenosis measurements (when applicable) are obtained utilizing NASCET criteria, using the distal internal carotid diameter as the denominator.  Contrast: 50mL OMNIPAQUE IOHEXOL 350 MG/ML SOLN  Comparison:  Brain MRI from earlier the same day.  CTA NECK  Findings:  Mild pulmonary atelectasis.  Atelectatic changes to the trachea.  No superior mediastinal lymphadenopathy.  Negative thyroid,  larynx, pharynx, parapharyngeal spaces, retropharyngeal space, sublingual space, submandibular glands  and parotid glands. Visualized orbit soft tissues are within normal limits.  Visualized paranasal sinuses and mastoids are clear.  Chronic appearing C1 multi part fracture, ununited.  No definite acute osseous abnormality.  No cervical lymphadenopathy.  Vascular Findings: Moderate arch atherosclerosis.  Bovine arch configuration.  Great vessel origins are patent.  Soft plaque just beyond the right common carotid artery origin resulting in stenosis of 65-70 % with respect to the distal vessel. Tortuous right common carotid artery. Soft and calcified plaque at the right carotid bifurcation.  High- grade stenosis at the level of the distal bulb measuring 75 % with respect to the distal vessel.    Tortuous more distal cervical right ICA.  Calcified plaque at the skull base, but no additional hemodynamically significant stenosis, see intracranial findings below.  Nondominant right vertebral artery is patent.  Its origin is partially obscured by streak from large body habitus and venous contrast.  No definite right vertebral artery stenosis in the neck.  Left vertebral artery origin is patent with mild if any stenosis. The left vertebral artery is dominant and patent throughout the neck with no other abnormality.  The proximal left common carotid artery is mildly irregular, less than that on the right and without definite hemodynamically significant stenosis.  There is calcified and low density plaque at the left ICA origin which is occluded.  There is no reconstituted flow in the cervical left ICA or proximal left ICA siphon. Intracranial findings below.   Review of the MIP images confirms the above findings.  IMPRESSION: 1.  Left ICA occluded at its origin with no reconstituted flow in the neck. 2.  High-grade stenosis of the right ICA at the level of the distal bulb, 75% stenosis with respect the distal vessel. 3.   High-grade stenosis of the proximal right common carotid artery estimated at 65-70%. 4.  Dominant left vertebral artery with mild if any stenosis at its origin.  Diminutive right vertebral artery throughout the neck. 5.  See intracranial findings below. 6.  Chronic ununited C1 ring fracture.  CTA HEAD  Findings:  Left MCA territory infarcts seen earlier today are not well visualized by CT. No acute intracranial hemorrhage identified. No midline shift, mass effect, or evidence of mass lesion.  No ventriculomegaly. No abnormal enhancement identified.  No acute osseous abnormality identified.  Vascular Findings: Dominant distal left vertebral artery is patent with mild atherosclerosis.  Diminutive distal right vertebral artery.  Both AICA appeared dominant.  The vertebrobasilar junction is patent.  The basilar artery is irregular but without hemodynamically significant stenosis.  SCA and PCA origins are patent.  Mild irregularity of the bilateral PCA branches. Posterior communicating arteries are diminutive or absent.  Right ICA siphon is patent.  There is severe calcified plaque in the right cavernous segment encasing the vessel such that the lumen is only faintly visible (axial image 193 sagittal image 64). Radiographic string sign results. Calcified plaque abate in the supraclinoid segment.  Right ophthalmic artery origin not visualized.  Right ICA terminus is patent. Probable infundibulum at the right anterior choroidal artery.  Right MCA and ACA origins are patent.  Right MCA branches are within normal limits. Anterior communicating artery is present.  Bilateral ACA branches are normal except for stenosis in a distal pericallosal vessel.  No enhancement of the proximal left ICA siphon.  No enhancement of the cavernous segment.  Reconstituted flow first seen in the anterior genu and supraclinoid ICA segment.  Diminutive but patent left ICA terminus.  Diminutive but patent left MCA and ACA origins. Left MCA M1  segment is patent.  Major left MCA branches are patent but diminutive compared to those on the right.  No major left MCA branch occlusion.   Review of the MIP images confirms the above findings.  IMPRESSION: 1.  Thrombosed left ICA siphon.  Some reconstituted flow in the left supraclinoid ICA, such that the left ICA terminus, left MCA, and left ACA origins remain patent. 2.  Patent but attenuated left MCA major branches compared to those on the right. 3.  Symmetric ACA flow. 4.  Distal ACA stenosis.  Right MCA branches within normal limits. 5.  Posterior circulation mild atherosclerosis with no major branch occlusion. 6.  Acute left MCA infarcts seen earlier are not well visualized by CT.  No mass effect or hemorrhage.   Original Report Authenticated By: Harley Hallmark, M.D.    Ct Angio Neck W/cm &/or Wo/cm  07/07/2012  *RADIOLOGY REPORT*  Clinical Data:  61 year old female with evidence of poor flow or occlusion of the left ICA at the skull base on recent MRI with scattered small acute left MCA territory infarcts.  CT ANGIOGRAPHY HEAD AND NECK  Technique:  Multidetector CT imaging of the head and neck was performed using the standard protocol during bolus administration of intravenous contrast.  Multiplanar CT image reconstructions including MIPs were obtained to evaluate the vascular anatomy. Carotid stenosis measurements (when applicable) are obtained utilizing NASCET criteria, using the distal internal carotid diameter as the denominator.  Contrast: 50mL OMNIPAQUE IOHEXOL 350 MG/ML SOLN  Comparison:  Brain MRI from earlier the same day.  CTA NECK  Findings:  Mild pulmonary atelectasis.  Atelectatic changes to the trachea.  No superior mediastinal lymphadenopathy.  Negative thyroid, larynx, pharynx, parapharyngeal spaces, retropharyngeal space, sublingual space, submandibular glands and parotid glands. Visualized orbit soft tissues are within normal limits.  Visualized paranasal sinuses and mastoids are clear.   Chronic appearing C1 multi part fracture, ununited.  No definite acute osseous abnormality.  No cervical lymphadenopathy.  Vascular Findings: Moderate arch atherosclerosis.  Bovine arch configuration.  Great vessel origins are patent.  Soft plaque just beyond the right common carotid artery origin resulting in stenosis of 65-70 % with respect to the distal vessel. Tortuous right common carotid artery. Soft and calcified plaque at the right carotid bifurcation.  High- grade stenosis at the level of the distal bulb measuring 75 % with respect to the distal vessel.    Tortuous more distal cervical right ICA.  Calcified plaque at the skull base, but no additional hemodynamically significant stenosis, see intracranial findings below.  Nondominant right vertebral artery is patent.  Its origin is partially obscured by streak from large body habitus and venous contrast.  No definite right vertebral artery stenosis in the neck.  Left vertebral artery origin is patent with mild if any stenosis. The left vertebral artery is dominant and patent throughout the neck with no other abnormality.  The proximal left common carotid artery is mildly irregular, less than that on the right and without definite hemodynamically significant stenosis.  There is calcified and low density plaque at the left ICA origin which is occluded.  There is no reconstituted flow in the cervical left ICA or proximal left ICA siphon. Intracranial findings below.   Review of the MIP images confirms the above findings.  IMPRESSION: 1.  Left ICA occluded at its origin with no reconstituted flow in the neck. 2.  High-grade stenosis of the right ICA  at the level of the distal bulb, 75% stenosis with respect the distal vessel. 3.  High-grade stenosis of the proximal right common carotid artery estimated at 65-70%. 4.  Dominant left vertebral artery with mild if any stenosis at its origin.  Diminutive right vertebral artery throughout the neck. 5.  See  intracranial findings below. 6.  Chronic ununited C1 ring fracture.  CTA HEAD  Findings:  Left MCA territory infarcts seen earlier today are not well visualized by CT. No acute intracranial hemorrhage identified. No midline shift, mass effect, or evidence of mass lesion.  No ventriculomegaly. No abnormal enhancement identified.  No acute osseous abnormality identified.  Vascular Findings: Dominant distal left vertebral artery is patent with mild atherosclerosis.  Diminutive distal right vertebral artery.  Both AICA appeared dominant.  The vertebrobasilar junction is patent.  The basilar artery is irregular but without hemodynamically significant stenosis.  SCA and PCA origins are patent.  Mild irregularity of the bilateral PCA branches. Posterior communicating arteries are diminutive or absent.  Right ICA siphon is patent.  There is severe calcified plaque in the right cavernous segment encasing the vessel such that the lumen is only faintly visible (axial image 193 sagittal image 64). Radiographic string sign results. Calcified plaque abate in the supraclinoid segment.  Right ophthalmic artery origin not visualized.  Right ICA terminus is patent. Probable infundibulum at the right anterior choroidal artery.  Right MCA and ACA origins are patent.  Right MCA branches are within normal limits. Anterior communicating artery is present.  Bilateral ACA branches are normal except for stenosis in a distal pericallosal vessel.  No enhancement of the proximal left ICA siphon.  No enhancement of the cavernous segment.  Reconstituted flow first seen in the anterior genu and supraclinoid ICA segment.  Diminutive but patent left ICA terminus.  Diminutive but patent left MCA and ACA origins. Left MCA M1 segment is patent.  Major left MCA branches are patent but diminutive compared to those on the right.  No major left MCA branch occlusion.   Review of the MIP images confirms the above findings.  IMPRESSION: 1.  Thrombosed left  ICA siphon.  Some reconstituted flow in the left supraclinoid ICA, such that the left ICA terminus, left MCA, and left ACA origins remain patent. 2.  Patent but attenuated left MCA major branches compared to those on the right. 3.  Symmetric ACA flow. 4.  Distal ACA stenosis.  Right MCA branches within normal limits. 5.  Posterior circulation mild atherosclerosis with no major branch occlusion. 6.  Acute left MCA infarcts seen earlier are not well visualized by CT.  No mass effect or hemorrhage.   Original Report Authenticated By: Harley Hallmark, M.D.    Mri Brain Without Contrast  07/07/2012  *RADIOLOGY REPORT*  Clinical Data: 62 year old female with diabetes hypertension. Decreased mental status.  Dizziness recent cellulitis.  MRI HEAD WITHOUT CONTRAST  Technique:  Multiplanar, multiecho pulse sequences of the brain and surrounding structures were obtained according to standard protocol without intravenous contrast.  Comparison: Head CTs 07/06/2012 and earlier.  Brain MRI 09/13/2010.  Findings: Study is intermittently degraded by motion artifact despite repeated imaging attempts.  11 mm globular area of restricted diffusion in the left basal ganglia tracking to the left corona radiata.  Additional scattered punctate areas of cortical and subcortical white matter restriction in the left MCA territory.  Early T2 and FLAIR hyperintensity.  The no associated mass effect or hemorrhage.  No diffusion abnormality in the right hemisphere  or posterior fossa.  The left ICA flow void is now partially loss at the skull base and through the ICA siphon.  Other Major intracranial vascular flow voids are stable. There is abnormal increased FLAIR signal in the left MCA posterior sylvian division on series 6.  No ventriculomegaly. No midline shift, mass effect, or evidence of mass lesion.  No acute intracranial hemorrhage identified. Negative pituitary, cervicomedullary junction and visualized cervical spine. Stable T2 and FLAIR  signal in the brain outside of the acute findings.  Visualized orbit soft tissues are within normal limits.  Visualized paranasal sinuses and mastoids are clear.  The negative scalp soft tissues.  IMPRESSION: 1.  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 2.  No associated mass effect or hemorrhage.  Study discussed by telephone with Dr. Dan Humphreys on 07/07/2012 at 1139 hours.   Original Report Authenticated By: Harley Hallmark, M.D.     CT of the brain   IMPRESSION:  Generalized atrophy.  No gross acute intracranial abnormalities identified on exam   CT angio   IMPRESSION:  1. Left ICA occluded at its origin with no reconstituted flow in  the neck.  2. High-grade stenosis of the right ICA at the level of the distal  bulb, 75% stenosis with respect the distal vessel.  3. High-grade stenosis of the proximal right common carotid artery  estimated at 65-70%.  4. Dominant left vertebral artery with mild if any stenosis at its  origin. Diminutive right vertebral artery throughout the neck.  5. See intracranial findings below.  6. Chronic ununited C1 ring fracture.  IMPRESSION:  1. Thrombosed left ICA siphon. Some reconstituted flow in the  left supraclinoid ICA, such that the left ICA terminus, left MCA,  and left ACA origins remain patent.  2. Patent but attenuated left MCA major branches compared to those  on the right.  3. Symmetric ACA flow.  4. Distal ACA stenosis. Right MCA branches within normal limits.  5. Posterior circulation mild atherosclerosis with no major branch  occlusion.  6. Acute left MCA infarcts seen earlier are not well visualized by  CT. No mass effect or hemorrhage.   MRI of the brain   IMPRESSION:  1. Evidence of new poor flow or occlusion of the left ICA at the  skull base with associated scattered small acute left MCA  territory  infarcts, most confluent at the left basal ganglia.  2. No associated mass effect or hemorrhage.   MRA of the brain  Not ordered  2D Echocardiogram   Study Conclusions  - Left ventricle: The cavity size was normal. Wall thickness was increased in a pattern of mild LVH. Systolic function was normal. The estimated ejection fraction was in the range of 60% to 65%. Wall motion was normal; there were no regional wall motion abnormalities. Doppler parameters are consistent with abnormal left ventricular relaxation (grade 1 diastolic dysfunction). - Pericardium, extracardiac: A trivial pericardial effusion was identified.    Carotid Doppler  Not done.  CXR   IMPRESSION:  Minimal enlargement of cardiac silhouette.  No acute abnormalities.   EKG  NSR  Physical Exam   The patient is alert and cooperative.  Neurologic exam reveals full extraocular movements, speech is normal. Visual fields are full.  Motor testing reveals good strength of all four extremities.  The patient has good finger-nose-finger and heel-to-shin  bilaterally. Gait was not tested.  Deep tendon reflexes are symmetric and normal. Toes are down going bilaterally.    ASSESSMENT Sherri Hays is a 61 y.o. female with a left brain stroke, secondary to left ICA occlusion. On aspirin 81 mg orally every day for secondary stroke prevention.  Stroke risk factors:  carotid stenosis, diabetes mellitus and hypertension  Hospital day # 4  TREATMENT/PLAN Continue aspirin 81 mg orally every day and clopidogrel 75 mg orally every day for secondary stroke prevention.    1. Left internal carotid artery occlusion  2. 75% stenosis right internal carotid artery  3. 65-70% stenosis of the right common carotid artery  4. Diabetes  5. Hypertension  This patient has multiple risk factors for stroke. The patient did sustain a deep left brain stroke secondary to the left carotid artery occlusion. The  patient has high-grade stenosis of the right internal carotid artery as well, and this patient should be considered for a possible right carotid endarterectomy. At this point, stroke workup is essentially complete. Physical therapy believes that the patient is at her baseline, walking with a walker.  -Add Plavix to therapy for 90 days -Consult vascular surgery, Dr. Hart Rochester has seen -Followup with [facility] in 6-8 weeks,sign off at this time.   Lesly Dukes

## 2012-07-09 NOTE — Progress Notes (Signed)
   CARE MANAGEMENT NOTE 07/09/2012  Patient:  Sherri Hays, Sherri Hays   Account Number:  [id number]  Date Initiated:  07/06/2012  Documentation initiated by:  Junius Creamer  Subjective/Objective Assessment:   adm w encepalopathy     Action/Plan:   lives w husband   Anticipated DC Date:  07/10/2012   Anticipated DC Plan:  HOME/SELF CARE      DC Planning Services  CM consult      Choice offered to / List presented to:     DME arranged  Levan Hurst      DME agency  [facility]        Status of service:  Completed, signed off Medicare Important Message given?   (If response is "NO", the following Medicare IM given date fields will be blank) Date Medicare IM given:   Date Additional Medicare IM given:    Discharge Disposition:  HOME/SELF CARE  Per UR Regulation:  Reviewed for med. necessity/level of care/duration of stay  If discussed at Long Length of Stay Meetings, dates discussed:    Comments:  07/09/2012 1200 NCM spoke to pt and states she does need RW for home. Contacted [facility] for RW for home. Faxed order and facesheet. No other CM needs identified. Isidoro Donning RN CCM Case Mgmt phone [phone]   9/5 9:08a debbie dowell rn,bsn [phone]  07-07-12 12noon Avie Arenas, RNBSN [phone] Number for contact listed on face sheet - Querry,Angela Sister [phone] - is not a valid number.  In 03-2012 epic shows patient discharged to [facility].  SW notified to assist with discharge planning.

## 2012-07-09 NOTE — Discharge Summary (Signed)
Physician Discharge Summary  Sherri Hays ZOX:[id number] DOB: 1951-08-22 DOA: 07/05/2012  PCP: Dorrene German, MD  Admit date: 07/05/2012 Discharge date: 07/09/2012  Recommendations for Outpatient Follow-up:  1. Followup with primary medical doctor in one week from hospital discharge. 2. Followup with neurologist in 6-8 weeks from hospital discharge. 3. Followup with vascular surgeon.   Discharge Diagnoses:  Principal Problem:  *CVA (cerebral infarction) Active Problems:  DIABETES MELLITUS, TYPE II  HYPERTENSION  ASTHMA  COPD  Schizoaffective disorder, depressive type  Abdominal wall cellulitis  Encephalopathy acute  Leukocytosis  Internal carotid artery stenosis  ICAO (internal carotid artery occlusion)  Near syncope   Discharge Condition: Improved and stable.  Diet recommendation: Heart healthy and diabetic diet.  Filed Weights   07/06/12 0700 07/09/12 0421  Weight: 93.2 kg (205 lb 7.5 oz) 93.123 kg (205 lb 4.8 oz)    History of present illness:  61 year-old female with history of diabetes mellitus 2, hypertension, hyperlipidemia, schizophrenia who was initially seen in the ED for dizziness and when about to be discharged home she was found to be unresponsive in the ED. CT head did not show any acute findings, ammonia levels, LFTs and urine drug screen were negative. Patient was admitted for acute change in mental status.    Hospital Course:  1. 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 They recommended continue 81 mg aspirin and added Plavix daily for 90 days for secondary stroke prevention. PT has seen and recommend a rolling walker with 5 inch wheels. She is to followup with the neurologist in 6-8 weeks. 2. 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 Her right internal carotid artery stenosis is asymptomatic but in view of left ICA  occlusion Dr. Hart [place] recommends right carotid endarterectomy which he will arrange in 4-5 weeks from hospital discharge. He recommends nuclear medicine cardiac study preoperatively which can be arranged through patient's primary care physician. 3. Acute encephalopathy:? Secondary to problem #1. Unsure of patient's baseline mental status but has significantly improved since admission and probably at baseline. Attempts to reach patient's boyfriend or family repeatedly have been unsuccessful. 4. Near syncope: Apparently this was the reason she was brought to the hospital.? Secondary to problem #1. Workup complete.  5. History of recent abdominal wall cellulitis: Seems to have resolved. Completed doxycycline. 6. COPD: Stable. Continue Advair, bronchodilator nebulizations 7. DM 2: Reasonable blood sugar control inpatient. Continue Lantus and sliding scale insulin. Hemoglobin A1c: 6.8-7.1-will need tighter control. 8. Schizophrenia: Medications have been resumed. 9. Hypertension: Controlled. Continue amlodipine. 10. Chronic ununited C1 ring fracture. Discussed with Dr. Jeral Fruit, neurosurgeon on call on 9/7 who reviewed patient's CT scans and opined that she has had this chronic fracture at least since November of 2011 and did not recommend any interventions at this time. 11. Anemia: Stable. 12. Asymptomatic bacteriuria: Although urine culture shows Morganella morganii, patient's UA on 9/5 is unimpressive and patient denies urinary symptoms such as dysuria, urinary frequency, chills or fevers. Will not treat with antibiotics.  Procedures:  EEG.  CT angiogram of head and neck.  Consultations:  Neurology.  Vascular surgery.  Discharge Exam:  Complaints Patient denies complaints and is eager to go home. She denies pain or dyspnea.  Filed Vitals:   07/09/12 0421 07/09/12 0430 07/09/12 0755 07/09/12 1321  BP:  148/63  128/44  Pulse:  73  72  Temp:  98.6 F (37 C)  98.9 F (  37.2 C)  TempSrc:   Oral  Oral  Resp:  18  18  Height:      Weight: 93.123 kg (205 lb 4.8 oz)     SpO2:  96% 90% 95%    General exam: Moderately built and obese female comfortable.  Respiratory system: Clear to auscultation. No increased work of breathing.  Cardiovascular system: S1 and S2 heard, regular rate and rhythm. No murmurs, JVD, gallops. Telemetry shows sinus rhythm. 1+ bilateral pitting edema.  Gastrointestinal system: Abdomen is nondistended, soft and nontender. Normal bowel sounds heard.  Central system, alert and oriented to person, place and partly to time. No facial asymmetry, evidence of aphasia or other cranial nerve deficits.  Extremities: Grade 5/5 power in the left limbs and? 4/5 power in right limbs versus inadequate patient cooperation.  Discharge Instructions  Discharge Orders    Future Orders Please Complete By Expires   Diet - low sodium heart healthy      Diet Carb Modified      Increase activity slowly      Call MD for:  persistant dizziness or light-headedness      Call MD for:  difficulty breathing, headache or visual disturbances        Medication List  As of 07/09/2012  2:43 PM   STOP taking these medications         diclofenac 75 MG EC tablet      doxycycline 100 MG tablet         TAKE these medications         acetaminophen 325 MG tablet   Commonly known as: TYLENOL   Take 2 tablets (650 mg total) by mouth every 6 (six) hours as needed for pain or fever (or Fever >/= 101).      albuterol 108 (90 BASE) MCG/ACT inhaler   Commonly known as: PROVENTIL HFA;VENTOLIN HFA   Inhale 2 puffs into the lungs every 6 (six) hours as needed. For dyspnea      amLODipine 10 MG tablet   Commonly known as: NORVASC   Take 10 mg by mouth daily.      aspirin 81 MG tablet   Take 1 tablet (81 mg total) by mouth daily.      atorvastatin 10 MG tablet   Commonly known as: LIPITOR   Take 1 tablet (10 mg total) by mouth at bedtime. For cholesterol.      clopidogrel 75 MG tablet    Commonly known as: PLAVIX   Take 1 tablet (75 mg total) by mouth daily with breakfast.      fluPHENAZine 5 MG tablet   Commonly known as: PROLIXIN   Take 10 mg by mouth at bedtime.      Fluticasone-Salmeterol 250-50 MCG/DOSE Aepb   Commonly known as: ADVAIR   Inhale 1 puff into the lungs 2 (two) times daily. For COPD      gabapentin 300 MG capsule   Commonly known as: NEURONTIN   Take 300 mg by mouth 3 (three) times daily. For anxiety and depression.      hydrALAZINE 50 MG tablet   Commonly known as: APRESOLINE   Take 1 tablet (50 mg total) by mouth 3 (three) times daily. For elevated blood pressure.      insulin glargine 100 UNIT/ML injection   Commonly known as: LANTUS   Inject 30 Units into the skin at bedtime. For glycemic control.      metFORMIN 500 MG tablet   Commonly known as: GLUCOPHAGE  Take 1 tablet (500 mg total) by mouth 2 (two) times daily with a meal. For glycemic control.      oxybutynin 10 MG 24 hr tablet   Commonly known as: DITROPAN-XL   Take 1 tablet (10 mg total) by mouth at bedtime. For overactive bladder.      sertraline 100 MG tablet   Commonly known as: ZOLOFT   Take 2 tablets (200 mg total) by mouth daily. For depression and anxiety.      traZODone 100 MG tablet   Commonly known as: DESYREL   Take 100 mg by mouth at bedtime.      trihexyphenidyl 2 MG tablet   Commonly known as: ARTANE   Take 0.5 tablets (1 mg total) by mouth at bedtime. For EPS.           Follow-up Information    Follow up with AVBUERE,EDWIN A, MD. Schedule an appointment as soon as possible for a visit in 1 week. (Follow up with Primary Care Physician.  Call tomorrow morning to make an appointment.)    Contact information:   [address] [place] [address] [phone]       Follow up with Lesly Dukes, MD. Schedule an appointment as soon as possible for a visit in 6 weeks.   Contact information:   [address] [place] [address]  [facility] [place] [address] [phone]       Schedule an appointment as soon as possible for a visit with [name], MD.   Contact information:   [address] [place] [address] [phone]           The results of significant diagnostics from this hospitalization (including imaging, microbiology, ancillary and laboratory) are listed below for reference.    Studies:   Dg Chest 2 View   07/06/2012 *RADIOLOGY REPORT* Clinical Data: Lethargy, altered mental status, history hypertension, diabetes, asthma CHEST - 2 VIEW Comparison: 05/28/2012 Findings: Minimal enlargement of cardiac silhouette. Slight rotation to the right. Mediastinal contours and pulmonary vascularity normal. Lungs clear. No pleural effusion or pneumothorax. No acute osseous findings. IMPRESSION: Minimal enlargement of cardiac silhouette. No acute abnormalities. Original Report Authenticated By: Lollie Marrow, M.D.    Ct Head Wo Contrast   07/06/2012 *RADIOLOGY REPORT* Clinical Data: Near-syncope, history hypertension, diabetes, stroke CT HEAD WITHOUT CONTRAST Technique: Contiguous axial images were obtained from the base of the skull through the vertex without contrast. Comparison: 05/27/2012 Findings: Scattered streak artifacts from patient motion despite repeating images, limiting exam. Generalized atrophy. Normal ventricular morphology. No midline shift or mass effect. No gross intracranial hemorrhage or evidence of acute infarction identified though exam is limited by motion and intracranial pathology cannot be excluded. No definite evidence of mass or extra-axial fluid collection. No acute bone or sinus abnormalities. IMPRESSION: Generalized atrophy. No gross acute intracranial abnormalities identified on exam limited by patient motion artifacts as above. Original Report Authenticated By: Lollie Marrow, M.D.    Mri Brain Without Contrast   07/07/2012 *RADIOLOGY REPORT*  Clinical Data: 61 year old female with diabetes hypertension. Decreased mental status. Dizziness recent cellulitis. MRI HEAD WITHOUT CONTRAST Technique: Multiplanar, multiecho pulse sequences of the brain and surrounding structures were obtained according to standard protocol without intravenous contrast. Comparison: Head CTs 07/06/2012 and earlier. Brain MRI 09/13/2010. Findings: Study is intermittently degraded by motion artifact despite repeated imaging attempts. 11 mm globular area of restricted diffusion in the left basal ganglia tracking to the left corona radiata.  Additional scattered punctate areas of cortical and subcortical white matter restriction in the left MCA territory. Early T2 and FLAIR hyperintensity. The no associated mass effect or hemorrhage. No diffusion abnormality in the right hemisphere or posterior fossa. The left ICA flow void is now partially loss at the skull base and through the ICA siphon. Other Major intracranial vascular flow voids are stable. There is abnormal increased FLAIR signal in the left MCA posterior sylvian division on series 6. No ventriculomegaly. No midline shift, mass effect, or evidence of mass lesion. No acute intracranial hemorrhage identified. Negative pituitary, cervicomedullary junction and visualized cervical spine. Stable T2 and FLAIR signal in the brain outside of the acute findings. Visualized orbit soft tissues are within normal limits. Visualized paranasal sinuses and mastoids are clear. The negative scalp soft tissues. IMPRESSION: 1. 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 2. No associated mass effect or hemorrhage. Study discussed by telephone with Dr. Dan Humphreys on 07/07/2012 at 1139 hours. Original Report Authenticated By: Harley Hallmark, M.D.    CTA neck   IMPRESSION:  1. Left ICA occluded at its origin with  no reconstituted flow in  the neck.  2. High-grade stenosis of the right ICA at the level of the distal  bulb, 75% stenosis with respect the distal vessel.  3. High-grade stenosis of the proximal right common carotid artery  estimated at 65-70%.  4. Dominant left vertebral artery with mild if any stenosis at its  origin. Diminutive right vertebral artery throughout the neck.  5. See intracranial findings below.  6. Chronic ununited C1 ring fracture.    CT head   IMPRESSION:  1. Thrombosed left ICA siphon. Some reconstituted flow in the  left supraclinoid ICA, such that the left ICA terminus, left MCA,  and left ACA origins remain patent.  2. Patent but attenuated left MCA major branches compared to those  on the right.  3. Symmetric ACA flow.  4. Distal ACA stenosis. Right MCA branches within normal limits.  5. Posterior circulation mild atherosclerosis with no major branch  occlusion.  6. Acute left MCA infarcts seen earlier are not well visualized by  CT. No mass effect or hemorrhage.    2-D echo   Study Conclusions  - Left ventricle: The cavity size was normal. Wall thickness was increased in a pattern of mild LVH. Systolic function was normal. The estimated ejection fraction was in the range of 60% to 65%. Wall motion was normal; there were no regional wall motion abnormalities. Doppler parameters are consistent with abnormal left ventricular relaxation (grade 1 diastolic dysfunction). - Pericardium, extracardiac: A trivial pericardial effusion was identified.     Microbiology: Recent Results (from the past 240 hour(s))  URINE CULTURE     Status: Normal   Collection Time   06/29/12  7:42 PM      Component Value Range Status Comment   Specimen Description URINE, CLEAN CATCH   Final    Special Requests NONE   Final    Culture  Setup Time 06/29/2012 20:54   Final    Colony Count >=100,000 COLONIES/ML   Final    Culture     Final    Value: Multiple bacterial  morphotypes present, none predominant. Suggest appropriate recollection if clinically indicated.   Report Status 06/30/2012 FINAL   Final   URINE CULTURE  Status: Normal   Collection Time   07/02/12 10:32 AM      Component Value Range Status Comment   Specimen Description URINE, CLEAN CATCH   Final    Special Requests NONE   Final    Culture  Setup Time 07/02/2012 21:40   Final    Colony Count NO GROWTH   Final    Culture NO GROWTH   Final    Report Status 07/03/2012 FINAL   Final   MRSA PCR SCREENING     Status: Normal   Collection Time   07/06/12  6:56 AM      Component Value Range Status Comment   MRSA by PCR NEGATIVE  NEGATIVE Final   URINE CULTURE     Status: Normal   Collection Time   07/07/12  5:54 AM      Component Value Range Status Comment   Specimen Description URINE, CLEAN CATCH   Final    Special Requests NONE   Final    Culture  Setup Time 07/07/2012 08:49   Final    Colony Count >=100,000 COLONIES/ML   Final    Culture [facility] MORGANII   Final    Report Status 07/09/2012 FINAL   Final    Organism ID, Bacteria MORGANELLA MORGANII   Final      Labs: Basic Metabolic Panel:  Lab 07/06/12 1610 07/05/12 2310  NA 139 141  K 3.6 3.6  CL 100 100  CO2 29 --  GLUCOSE 105* 128*  BUN 17 21  CREATININE 0.59 1.00  CALCIUM 9.3 --  MG -- --  PHOS -- --   Liver Function Tests:  Lab 07/06/12 0750 07/06/12 0204  AST 13 13  ALT 15 14  ALKPHOS 87 78  BILITOT 0.2* 0.2*  PROT 7.5 7.3  ALBUMIN 3.4* 3.3*   No results found for this basename: LIPASE:5,AMYLASE:5 in the last 168 hours  Lab 07/06/12 0205  AMMONIA 35   CBC:  Lab 07/09/12 0730 07/08/12 0635 07/06/12 0750 07/06/12 0132 07/05/12 2310  WBC 11.9* 11.1* 15.2* 14.3* --  NEUTROABS -- -- 8.8* 8.9* --  HGB 11.2* 10.9* 12.5 11.8* 13.9  HCT 35.3* 34.9* 39.5 36.3 41.0  MCV 83.6 83.5 83.2 83.4 --  PLT 372 351 361 334 --   Cardiac Enzymes: No results found for this basename:  CKTOTAL:5,CKMB:5,CKMBINDEX:5,TROPONINI:5 in the last 168 hours BNP: BNP (last 3 results)  Basename 03/16/12 0700 01/10/12 2353  PROBNP 330.3* 581.7*   CBG:  Lab 07/09/12 1125 07/09/12 0616 07/08/12 2114 07/08/12 1632 07/08/12 1118  GLUCAP 276* 111* 113* 105* 177*   Other lab data  1. ABG on admission: PH 7.373, PCO2 52, PO2 61, bicarbonate 30 and oxygen saturation 90%. 2. Lipid panel: Cholesterol 140, triglycerides 109, HDL 42, LDL 76, VLDL 22. 3. Salicylate level less than 2 and acetaminophen level less than 15. 4. Hemoglobin A1c: 7.1. 5. TSH: 1.699. 6. UDS: Negative.    Time coordinating discharge: Greater than 30 minutes  Signed:  Brelee Renk  [facility] 07/09/2012, 2:43 PM

## 2012-07-10 ENCOUNTER — Other Ambulatory Visit: Payer: Self-pay | Admitting: *Deleted

## 2012-07-10 ENCOUNTER — Telehealth: Payer: Self-pay | Admitting: Vascular Surgery

## 2012-07-10 DIAGNOSIS — Z01818 Encounter for other preprocedural examination: Secondary | ICD-10-CM

## 2012-07-10 DIAGNOSIS — I6529 Occlusion and stenosis of unspecified carotid artery: Secondary | ICD-10-CM

## 2012-07-10 DIAGNOSIS — I63239 Cerebral infarction due to unspecified occlusion or stenosis of unspecified carotid arteries: Secondary | ICD-10-CM

## 2012-07-10 NOTE — Telephone Encounter (Addendum)
Message copied by Shari Prows on Mon Jul 10, 2012  2:10 PM ------      Message from: [place], [place] K      Created: Mon Jul 10, 2012 10:54 AM      Regarding: schedule       Myoview is now named Myocardial perfusion test            ----- Message -----         From: Melene Plan, RN         Sent: 07/10/2012  10:26 AM           To: Sharee Pimple, CMA                        ----- Message -----         From: Pryor Ochoa, MD         Sent: 07/08/2012   3:10 PM           To: Reuel Derby, Melene Plan, RN            07/08/2012      Level IV consult seen by Dr. Kate Sable patient      Diagnosis left ICA occlusion and moderately severe right ICA stenosis with acute left brain CVA            Judy-I will need to schedule this patient for right carotid endarterectomy in about 4 weeks. She needs Myoview performed in the interim. If it is not scheduled prior to her discharge from the hospital we will need to arrange that.       I scheduled the above patient an appointment for Myoview at [facility] on Thurs 07/13/12 at 12 noon. The patient is to arrive at 11:45am. They will call her on Wednesday to give her specific instructions for this procedure. I was unable to reach patient by telephone. The phone number  we have for this pt is non-working and the ph# for the emergency contact person is also non working. I relayed the message to Jacquelyne Balint. awt

## 2012-07-12 ENCOUNTER — Telehealth: Payer: Self-pay | Admitting: Vascular Surgery

## 2012-07-12 ENCOUNTER — Encounter: Payer: Self-pay | Admitting: Vascular Surgery

## 2012-07-12 NOTE — Telephone Encounter (Signed)
I attempted again to contact this patient regarding a myoview that Dr.Lawson wanted our office to schedule prior to r carotid endarterectomy in 4 wks. I rescheduled the myoview appt to 07/19/12 at 9:45am w/ [facility] because I was unable to reach the patient for the original appt scheduled for 07/13/12. Her ph# is still dissconnected as of today so I mailed her a letter regarding the upcoming appt. Jacquelyne Balint advised also. AWT

## 2012-07-13 ENCOUNTER — Encounter (HOSPITAL_COMMUNITY): Payer: Self-pay

## 2012-07-19 ENCOUNTER — Telehealth (HOSPITAL_COMMUNITY): Payer: Self-pay

## 2012-07-19 ENCOUNTER — Encounter (HOSPITAL_COMMUNITY): Payer: Self-pay

## 2012-07-19 NOTE — Telephone Encounter (Signed)
Left message on patient's home recorder [phone]) for the patient to call us regarding Lexiscan test scheduled for earlier today. Notified Dr. Candie Chroman office of No show today. Irean Hong, RN.

## 2012-07-26 ENCOUNTER — Telehealth: Payer: Self-pay | Admitting: Vascular Surgery

## 2012-07-26 NOTE — Telephone Encounter (Signed)
Per Oswaldo Done, RN instructions I called the patient again to see if she would like to reschedule her myoview appt prior to scheduling r CEA with [facility]. She no showed her previous appt on 07/13/12 at [facility]. I called at 12:30pm and a female relative asked that I call back to speak to the patient. I called again at 3pm and left a voice mail message regarding the myoview and appt w. [facility]. awt

## 2012-07-26 NOTE — Discharge Summary (Signed)
Physician Discharge Summary  Sherri Hays AVW:[id number] DOB: [birth year] DOA: 06/29/2012  PCP: Dorrene German, MD  Admit date: 06/29/2012 Discharge date: 07/26/2012  Recommendations for Outpatient Follow-up:  1. Followup with primary medical doctor in one week from hospital discharge. 2. Followup with neurologist in 6-8 weeks from hospital discharge. 3. Followup with vascular surgeon.   Discharge Diagnoses:  Principal Problem:  *Abdominal wall cellulitis Active Problems:  DIABETES MELLITUS, TYPE II  ASTHMA  UTI (lower urinary tract infection)   Discharge Condition: Improved and stable.  Diet recommendation: Heart healthy and diabetic diet.  Filed Weights   07/01/12 0458 07/02/12 0621 07/02/12 2041  Weight: 96.8 kg (213 lb 6.5 oz) 94.9 kg (209 lb 3.5 oz) 95.1 kg (209 lb 10.5 oz)    History of present illness:  61 year-old female with history of diabetes mellitus 2, hypertension, hyperlipidemia, schizophrenia who was initially seen in the ED for dizziness and when about to be discharged home she was found to be unresponsive in the ED. CT head did not show any acute findings, ammonia levels, LFTs and urine drug screen were negative. Patient was admitted for acute change in mental status.    Hospital Course:  1. 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 They recommended continue 81 mg aspirin and added Plavix daily for 90 days for secondary stroke prevention. PT has seen and recommend a rolling walker with 5 inch wheels. She is to followup with the neurologist in 6-8 weeks. 2. 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 Her right internal carotid artery stenosis is asymptomatic but in view of left ICA occlusion Dr. Hart [place] recommends right carotid endarterectomy which he will arrange in 4-5 weeks from hospital discharge. He recommends nuclear medicine  cardiac study preoperatively which can be arranged through patient's primary care physician. 3. Acute encephalopathy:? Secondary to problem #1. Unsure of patient's baseline mental status but has significantly improved since admission and probably at baseline. Attempts to reach patient's boyfriend or family repeatedly have been unsuccessful. 4. Near syncope: Apparently this was the reason she was brought to the hospital.? Secondary to problem #1. Workup complete.  5. History of recent abdominal wall cellulitis: Seems to have resolved. Completed doxycycline. 6. COPD: Stable. Continue Advair, bronchodilator nebulizations 7. DM 2: Reasonable blood sugar control inpatient. Continue Lantus and sliding scale insulin. Hemoglobin A1c: 6.8-7.1-will need tighter control. 8. Schizophrenia: Medications have been resumed. 9. Hypertension: Controlled. Continue amlodipine. 10. Chronic ununited C1 ring fracture. Discussed with Dr. Jeral Fruit, neurosurgeon on call on 9/7 who reviewed patient's CT scans and opined that she has had this chronic fracture at least since November of 2011 and did not recommend any interventions at this time. 11. Anemia: Stable. 12. Asymptomatic bacteriuria: Although urine culture shows Morganella morganii, patient's UA on 9/5 is unimpressive and patient denies urinary symptoms such as dysuria, urinary frequency, chills or fevers. Will not treat with antibiotics.  Procedures:  EEG.  CT angiogram of head and neck.  Consultations:  Neurology.  Vascular surgery.  Discharge Exam:  Complaints Patient denies complaints and is eager to go home. She denies pain or dyspnea.  Filed Vitals:   07/02/12 0856 07/02/12 1037 07/02/12 1300 07/02/12 2041  BP:  138/90 136/82 173/80  Pulse:   90 82  Temp:   98.6 F (37 C) 98.3 F (36.8 C)  TempSrc:    Oral  Resp:   18 18  Height:  Weight:    95.1 kg (209 lb 10.5 oz)  SpO2: 92%  94% 95%    General exam: Moderately built and obese  female comfortable.  Respiratory system: Clear to auscultation. No increased work of breathing.  Cardiovascular system: S1 and S2 heard, regular rate and rhythm. No murmurs, JVD, gallops. Telemetry shows sinus rhythm. 1+ bilateral pitting edema.  Gastrointestinal system: Abdomen is nondistended, soft and nontender. Normal bowel sounds heard.  Central system, alert and oriented to person, place and partly to time. No facial asymmetry, evidence of aphasia or other cranial nerve deficits.  Extremities: Grade 5/5 power in the left limbs and? 4/5 power in right limbs versus inadequate patient cooperation.  Discharge Instructions      Discharge Orders    Future Orders Please Complete By Expires   Diet Carb Modified          Medication List     As of 07/26/2012  4:15 PM    STOP taking these medications         diclofenac 75 MG EC tablet   Commonly known as: VOLTAREN      TAKE these medications         acetaminophen 325 MG tablet   Commonly known as: TYLENOL   Take 2 tablets (650 mg total) by mouth every 6 (six) hours as needed for pain or fever (or Fever >/= 101).      albuterol 108 (90 BASE) MCG/ACT inhaler   Commonly known as: PROVENTIL HFA;VENTOLIN HFA   Inhale 2 puffs into the lungs every 6 (six) hours as needed. For dyspnea      amLODipine 10 MG tablet   Commonly known as: NORVASC   Take 10 mg by mouth daily.      aspirin 81 MG tablet   Take 1 tablet (81 mg total) by mouth daily.      atorvastatin 10 MG tablet   Commonly known as: LIPITOR   Take 1 tablet (10 mg total) by mouth at bedtime. For cholesterol.      fluPHENAZine 5 MG tablet   Commonly known as: PROLIXIN   Take 10 mg by mouth at bedtime.      Fluticasone-Salmeterol 250-50 MCG/DOSE Aepb   Commonly known as: ADVAIR   Inhale 1 puff into the lungs 2 (two) times daily. For COPD      gabapentin 300 MG capsule   Commonly known as: NEURONTIN   Take 300 mg by mouth 3 (three) times daily. For anxiety and  depression.      hydrALAZINE 50 MG tablet   Commonly known as: APRESOLINE   Take 1 tablet (50 mg total) by mouth 3 (three) times daily. For elevated blood pressure.      insulin glargine 100 UNIT/ML injection   Commonly known as: LANTUS   Inject 30 Units into the skin at bedtime. For glycemic control.      metFORMIN 500 MG tablet   Commonly known as: GLUCOPHAGE   Take 1 tablet (500 mg total) by mouth 2 (two) times daily with a meal. For glycemic control.      oxybutynin 10 MG 24 hr tablet   Commonly known as: DITROPAN-XL   Take 1 tablet (10 mg total) by mouth at bedtime. For overactive bladder.      sertraline 100 MG tablet   Commonly known as: ZOLOFT   Take 2 tablets (200 mg total) by mouth daily. For depression and anxiety.      traZODone 100 MG tablet  Commonly known as: DESYREL   Take 100 mg by mouth at bedtime.      trihexyphenidyl 2 MG tablet   Commonly known as: ARTANE   Take 0.5 tablets (1 mg total) by mouth at bedtime. For EPS.           The results of significant diagnostics from this hospitalization (including imaging, microbiology, ancillary and laboratory) are listed below for reference.    Studies:   Dg Chest 2 View   07/06/2012 *RADIOLOGY REPORT* Clinical Data: Lethargy, altered mental status, history hypertension, diabetes, asthma CHEST - 2 VIEW Comparison: 05/28/2012 Findings: Minimal enlargement of cardiac silhouette. Slight rotation to the right. Mediastinal contours and pulmonary vascularity normal. Lungs clear. No pleural effusion or pneumothorax. No acute osseous findings. IMPRESSION: Minimal enlargement of cardiac silhouette. No acute abnormalities. Original Report Authenticated By: Lollie Marrow, M.D.    Ct Head Wo Contrast   07/06/2012 *RADIOLOGY REPORT* Clinical Data: Near-syncope, history hypertension, diabetes, stroke CT HEAD WITHOUT CONTRAST Technique: Contiguous axial images were obtained from the base of the skull through the vertex without  contrast. Comparison: 05/27/2012 Findings: Scattered streak artifacts from patient motion despite repeating images, limiting exam. Generalized atrophy. Normal ventricular morphology. No midline shift or mass effect. No gross intracranial hemorrhage or evidence of acute infarction identified though exam is limited by motion and intracranial pathology cannot be excluded. No definite evidence of mass or extra-axial fluid collection. No acute bone or sinus abnormalities. IMPRESSION: Generalized atrophy. No gross acute intracranial abnormalities identified on exam limited by patient motion artifacts as above. Original Report Authenticated By: Lollie Marrow, M.D.    Mri Brain Without Contrast   07/07/2012 *RADIOLOGY REPORT* Clinical Data: 61 year old female with diabetes hypertension. Decreased mental status. Dizziness recent cellulitis. MRI HEAD WITHOUT CONTRAST Technique: Multiplanar, multiecho pulse sequences of the brain and surrounding structures were obtained according to standard protocol without intravenous contrast. Comparison: Head CTs 07/06/2012 and earlier. Brain MRI 09/13/2010. Findings: Study is intermittently degraded by motion artifact despite repeated imaging attempts. 11 mm globular area of restricted diffusion in the left basal ganglia tracking to the left corona radiata. Additional scattered punctate areas of cortical and subcortical white matter restriction in the left MCA territory. Early T2 and FLAIR hyperintensity. The no associated mass effect or hemorrhage. No diffusion abnormality in the right hemisphere or posterior fossa. The left ICA flow void is now partially loss at the skull base and through the ICA siphon. Other Major intracranial vascular flow voids are stable. There is abnormal increased FLAIR signal in the left MCA posterior sylvian division on series 6. No ventriculomegaly. No midline shift, mass effect, or evidence of mass lesion. No acute intracranial hemorrhage identified.  Negative pituitary, cervicomedullary junction and visualized cervical spine. Stable T2 and FLAIR signal in the brain outside of the acute findings. Visualized orbit soft tissues are within normal limits. Visualized paranasal sinuses and mastoids are clear. The negative scalp soft tissues. IMPRESSION: 1. 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 2. No associated mass effect or hemorrhage. Study discussed by telephone with Dr. Dan Humphreys on 07/07/2012 at 1139 hours. Original Report Authenticated By: Harley Hallmark, M.D.    CTA neck   IMPRESSION:  1. Left ICA occluded at its origin with no reconstituted flow in  the neck.  2. High-grade stenosis of the right ICA at the level of the  distal  bulb, 75% stenosis with respect the distal vessel.  3. High-grade stenosis of the proximal right common carotid artery  estimated at 65-70%.  4. Dominant left vertebral artery with mild if any stenosis at its  origin. Diminutive right vertebral artery throughout the neck.  5. See intracranial findings below.  6. Chronic ununited C1 ring fracture.    CT head   IMPRESSION:  1. Thrombosed left ICA siphon. Some reconstituted flow in the  left supraclinoid ICA, such that the left ICA terminus, left MCA,  and left ACA origins remain patent.  2. Patent but attenuated left MCA major branches compared to those  on the right.  3. Symmetric ACA flow.  4. Distal ACA stenosis. Right MCA branches within normal limits.  5. Posterior circulation mild atherosclerosis with no major branch  occlusion.  6. Acute left MCA infarcts seen earlier are not well visualized by  CT. No mass effect or hemorrhage.    2-D echo   Study Conclusions  - Left ventricle: The cavity size was normal. Wall thickness was increased in a pattern of mild LVH. Systolic function was normal. The estimated  ejection fraction was in the range of 60% to 65%. Wall motion was normal; there were no regional wall motion abnormalities. Doppler parameters are consistent with abnormal left ventricular relaxation (grade 1 diastolic dysfunction). - Pericardium, extracardiac: A trivial pericardial effusion was identified.     Microbiology: No results found for this or any previous visit (from the past 240 hour(s)).   Labs: Basic Metabolic Panel: No results found for this basename: NA:5,K:5,CL:5,CO2:5,GLUCOSE:5,BUN:5,CREATININE:5,CALCIUM:5,MG:5,PHOS:5 in the last 168 hours Liver Function Tests: No results found for this basename: AST:5,ALT:5,ALKPHOS:5,BILITOT:5,PROT:5,ALBUMIN:5 in the last 168 hours No results found for this basename: LIPASE:5,AMYLASE:5 in the last 168 hours No results found for this basename: AMMONIA:5 in the last 168 hours CBC: No results found for this basename: WBC:5,NEUTROABS:5,HGB:5,HCT:5,MCV:5,PLT:5 in the last 168 hours Cardiac Enzymes: No results found for this basename: CKTOTAL:5,CKMB:5,CKMBINDEX:5,TROPONINI:5 in the last 168 hours BNP: BNP (last 3 results)  Basename 03/16/12 0700 01/10/12 2353  PROBNP 330.3* 581.7*   CBG: No results found for this basename: GLUCAP:5 in the last 168 hours Other lab data  1. ABG on admission: PH 7.373, PCO2 52, PO2 61, bicarbonate 30 and oxygen saturation 90%. 2. Lipid panel: Cholesterol 140, triglycerides 109, HDL 42, LDL 76, VLDL 22. 3. Salicylate level less than 2 and acetaminophen level less than 15. 4. Hemoglobin A1c: 7.1. 5. TSH: 1.699. 6. UDS: Negative.    No changes in DC summary as noted by Dr.Hongalgi 07/09/12  Signed:  Zannie Cove  [facility] 07/26/2012, 4:15 PM

## 2012-07-27 ENCOUNTER — Encounter: Payer: Self-pay | Admitting: Vascular Surgery

## 2012-08-07 ENCOUNTER — Encounter: Payer: Self-pay | Admitting: Vascular Surgery

## 2012-08-07 ENCOUNTER — Telehealth: Payer: Self-pay | Admitting: Vascular Surgery

## 2012-08-07 NOTE — Telephone Encounter (Signed)
I spoke with Sherri Hays regarding follow up appointments that [facility] had requested in a staff message from Sherri Hays on 07/13/12. She called and left a message and I returned her phone call. She wanted to reschedule these appointments that had been missed in September. I attempted to give her the information regarding the times/dates of appointments but I could not understand her. So she asked me to give the times/dates to her boyfriend. I then gave Sherri Hays, her boyfriend the appts for the myoview at [facility] on 08/14/12 at 9:45am. They are to arrive at 9:30am. And then to see Sherri Hays here in our office on 08/15/12 at 9:15am. He wrote everything down including the addresses and phone numbers for both offices. I also mailed a letter with these same instructions. Sherri Hays

## 2012-08-08 ENCOUNTER — Encounter: Payer: Self-pay | Admitting: Vascular Surgery

## 2012-08-08 ENCOUNTER — Telehealth: Payer: Self-pay | Admitting: Vascular Surgery

## 2012-08-08 NOTE — Telephone Encounter (Signed)
Pt's boyfriend Dorena Bodo (or Glenice Bow) called inquiring about Tanikka's schedule for surgery. I asked him if he remembered our conversation yesterday regarding the appointments for her myoview on 08/14/12 and to see Dr.Lawson on 08/15/12. He stated that he did understand those appointments but was unsure if the appointment for Tues 10/15 was for surgery. I explained it was for a consultation prior to any surgery. He voiced understanding and also verified the addresses and phone numbers for each appt. He also questioned me about Monday 08/14/12 being [place]. He stated the banks and the post offices were closed. I stated that we are open that day and so is [facility]. I told Mr.Baker that I mailed a letter yesterday with all the appointment information as well. awt

## 2012-08-11 ENCOUNTER — Emergency Department (HOSPITAL_COMMUNITY)
Admission: EM | Admit: 2012-08-11 | Discharge: 2012-08-12 | Disposition: A | Discharge status: Home / Self Care | Payer: Medicaid Other | Attending: Emergency Medicine | Admitting: Emergency Medicine

## 2012-08-11 DIAGNOSIS — R5383 Other fatigue: Secondary | ICD-10-CM | POA: Insufficient documentation

## 2012-08-11 DIAGNOSIS — G459 Transient cerebral ischemic attack, unspecified: Secondary | ICD-10-CM | POA: Insufficient documentation

## 2012-08-11 DIAGNOSIS — R531 Weakness: Secondary | ICD-10-CM

## 2012-08-11 DIAGNOSIS — I10 Essential (primary) hypertension: Secondary | ICD-10-CM | POA: Insufficient documentation

## 2012-08-11 DIAGNOSIS — Z794 Long term (current) use of insulin: Secondary | ICD-10-CM | POA: Insufficient documentation

## 2012-08-11 DIAGNOSIS — Z8673 Personal history of transient ischemic attack (TIA), and cerebral infarction without residual deficits: Secondary | ICD-10-CM | POA: Insufficient documentation

## 2012-08-11 DIAGNOSIS — E669 Obesity, unspecified: Secondary | ICD-10-CM | POA: Insufficient documentation

## 2012-08-11 DIAGNOSIS — E119 Type 2 diabetes mellitus without complications: Secondary | ICD-10-CM | POA: Insufficient documentation

## 2012-08-11 DIAGNOSIS — Z79899 Other long term (current) drug therapy: Secondary | ICD-10-CM | POA: Insufficient documentation

## 2012-08-11 DIAGNOSIS — R5381 Other malaise: Secondary | ICD-10-CM | POA: Insufficient documentation

## 2012-08-11 NOTE — ED Notes (Signed)
EMS-EMS was called out earlier this evening for hyperglycemia. EMS did not transport pt due to CBG only reading in the 300s and pt with no complaints. Pt was then called out again for right sided extremity weakness. On exam stroke scale negative and CBG 272. Pt with no complaints at this time.

## 2012-08-12 ENCOUNTER — Emergency Department (HOSPITAL_COMMUNITY): Payer: Medicaid Other

## 2012-08-12 LAB — BASIC METABOLIC PANEL
BUN: 4 mg/dL — ABNORMAL LOW (ref 6–23)
Calcium: 9.2 mg/dL (ref 8.4–10.5)
Chloride: 103 mEq/L (ref 96–112)
Creatinine, Ser: 0.54 mg/dL (ref 0.50–1.10)
GFR calc Af Amer: >90 mL/min (ref >90)

## 2012-08-12 LAB — URINALYSIS, ROUTINE W REFLEX MICROSCOPIC
Bilirubin Urine: NEGATIVE
Glucose, UA: NEGATIVE mg/dL
Ketones, ur: NEGATIVE mg/dL
Protein, ur: NEGATIVE mg/dL
pH: 6 (ref 5.0–8.0)

## 2012-08-12 LAB — CBC WITH DIFFERENTIAL/PLATELET
Basophils Absolute: 0.1 10*3/uL (ref 0.0–0.1)
Basophils Relative: 1 % (ref 0–1)
Eosinophils Relative: 4 % (ref 0–5)
HCT: 37.4 % (ref 36.0–46.0)
Hemoglobin: 12.1 g/dL (ref 12.0–15.0)
MCH: 27.1 pg (ref 26.0–34.0)
MCHC: 32.4 g/dL (ref 30.0–36.0)
MCV: 83.7 fL (ref 78.0–100.0)
Monocytes Absolute: 1 10*3/uL (ref 0.1–1.0)
Monocytes Relative: 7 % (ref 3–12)
Neutro Abs: 7.2 10*3/uL (ref 1.7–7.7)
RDW: 16.6 % — ABNORMAL HIGH (ref 11.5–15.5)

## 2012-08-12 LAB — URINE MICROSCOPIC-ADD ON

## 2012-08-12 LAB — RAPID URINE DRUG SCREEN, HOSP PERFORMED
Amphetamines: NOT DETECTED
Barbiturates: NOT DETECTED
Benzodiazepines: NOT DETECTED

## 2012-08-12 NOTE — ED Notes (Signed)
PTAR here to pick up pt.  Pt dc to home via stretcher.

## 2012-08-12 NOTE — ED Provider Notes (Signed)
History     CSN: [id number]  Arrival date & time 08/11/12  2219   First MD Initiated Contact with Patient 08/11/12 2259      Chief Complaint  Patient presents with  . Fatigue    (Consider location/radiation/quality/duration/timing/severity/associated sxs/prior treatment) HPI 61 yo female presents to the ED with complaint of right arm weakness followed by generalized weakness.  She was getting up to have dinner around 8 pm when she noticed her right arm felt weak and limp.  Symptoms lasted about 20 minutes, then she felt weak all over.  Pt called 911.  She reports h/o stroke affecting her left side.  No changes or missed medications.  No headache, no chest pain, sob, abd pain.  Past Medical History  Diagnosis Date  . Hypertension   . Dysphagia   . TIA (transient ischemic attack)   . Genital herpes   . DM neuropathies   . Anemia   . Arthritis   . Asthma   . Cancer     lt breast  . Schizophrenia, simple   . Diabetes mellitus   . Stroke     Past Surgical History  Procedure Date  . Cholecystectomy   . Cesarean section   . Tonsillectomy     No family history on file.  History  Substance Use Topics  . Smoking status: Current Every Day Smoker -- 0.5 packs/day    Types: Cigarettes  . Smokeless tobacco: Never Used  . Alcohol Use: No    OB History    Grav Para Term Preterm Abortions TAB SAB Ect Mult Living                  Review of Systems  All other systems reviewed and are negative.    Allergies  Penicillins  Home Medications   Current Outpatient Rx  Name Route Sig Dispense Refill  . ACETAMINOPHEN 325 MG PO TABS Oral Take 2 tablets (650 mg total) by mouth every 6 (six) hours as needed for pain or fever (or Fever >/= 101).    . ALBUTEROL SULFATE HFA 108 (90 BASE) MCG/ACT IN AERS Inhalation Inhale 2 puffs into the lungs every 6 (six) hours as needed. For dyspnea 1 Inhaler 11  . AMLODIPINE BESYLATE 10 MG PO TABS Oral Take 10 mg by mouth daily.    .  ASPIRIN 81 MG PO TABS Oral Take 1 tablet (81 mg total) by mouth daily. 30 tablet   . ATORVASTATIN CALCIUM 10 MG PO TABS Oral Take 1 tablet (10 mg total) by mouth at bedtime. For cholesterol.    . CLOPIDOGREL BISULFATE 75 MG PO TABS Oral Take 1 tablet (75 mg total) by mouth daily with breakfast. 30 tablet 0  . FLUPHENAZINE HCL 5 MG PO TABS Oral Take 10 mg by mouth at bedtime.    Marland Kitchen FLUTICASONE-SALMETEROL 250-50 MCG/DOSE IN AEPB Inhalation Inhale 1 puff into the lungs 2 (two) times daily. For COPD 60 each 11  . GABAPENTIN 300 MG PO CAPS Oral Take 300 mg by mouth 3 (three) times daily. For anxiety and depression.     Marland Kitchen HYDRALAZINE HCL 50 MG PO TABS Oral Take 1 tablet (50 mg total) by mouth 3 (three) times daily. For elevated blood pressure. 90 tablet 0  . INSULIN GLARGINE 100 UNIT/ML [place] SOLN Subcutaneous Inject 30 Units into the skin at bedtime. For glycemic control. 10 mL   . METFORMIN HCL 500 MG PO TABS Oral Take 1 tablet (500 mg total) by mouth 2 (  two) times daily with a meal. For glycemic control.    . OXYBUTYNIN CHLORIDE ER 10 MG PO TB24 Oral Take 1 tablet (10 mg total) by mouth at bedtime. For overactive bladder. 30 tablet 0  . SERTRALINE HCL 100 MG PO TABS Oral Take 2 tablets (200 mg total) by mouth daily. For depression and anxiety. 60 tablet 0  . TRAZODONE HCL 100 MG PO TABS Oral Take 100 mg by mouth at bedtime.    . TRIHEXYPHENIDYL HCL 2 MG PO TABS Oral Take 0.5 tablets (1 mg total) by mouth at bedtime. For EPS. 15 tablet 0    BP 132/53  Pulse 93  Temp 97.7 F (36.5 C) (Oral)  Resp 21  SpO2 95%  Physical Exam  Nursing note and vitals reviewed. Constitutional: She is oriented to person, place, and time. She appears well-developed and well-nourished. No distress.       obese  HENT:  Head: Normocephalic and atraumatic.  Right Ear: External ear normal.  Left Ear: External ear normal.  Nose: Nose normal.  Mouth/Throat: Oropharynx is clear and moist.  Eyes: Conjunctivae normal and EOM  are normal. Pupils are equal, round, and reactive to light.  Neck: Normal range of motion. Neck supple. No JVD present. No tracheal deviation present. No thyromegaly present.  Cardiovascular: Normal rate, regular rhythm, normal heart sounds and intact distal pulses.  Exam reveals no gallop and no friction rub.   No murmur heard. Pulmonary/Chest: Effort normal and breath sounds normal. No stridor. No respiratory distress. She has no wheezes. She has no rales. She exhibits no tenderness.  Abdominal: Soft. Bowel sounds are normal. She exhibits no distension and no mass. There is no tenderness. There is no rebound and no guarding.  Musculoskeletal: Normal range of motion. She exhibits no edema and no tenderness.  Lymphadenopathy:    She has no cervical adenopathy.  Neurological: She is oriented to person, place, and time. She has normal reflexes. No cranial nerve deficit. She exhibits normal muscle tone. Coordination normal.       Somnolent but arousable.  Left facial droop noted  Skin: Skin is warm and dry. No rash noted. No erythema. No pallor.  Psychiatric: She has a normal mood and affect. Her behavior is normal. Judgment and thought content normal.    ED Course  Procedures (including critical care time)  Labs Reviewed  GLUCOSE, CAPILLARY - Abnormal; Notable for the following:    Glucose-Capillary 256 (*)     All other components within normal limits  CBC WITH DIFFERENTIAL - Abnormal; Notable for the following:    WBC 13.8 (*)     RDW 16.6 (*)     Platelets 412 (*)     Lymphs Abs 5.0 (*)     All other components within normal limits  BASIC METABOLIC PANEL - Abnormal; Notable for the following:    Glucose, Bld 201 (*)     BUN 4 (*)     All other components within normal limits  URINALYSIS, ROUTINE W REFLEX MICROSCOPIC - Abnormal; Notable for the following:    Leukocytes, UA TRACE (*)     All other components within normal limits  URINE MICROSCOPIC-ADD ON - Abnormal; Notable for the  following:    Squamous Epithelial / LPF FEW (*)     All other components within normal limits  GLUCOSE, CAPILLARY - Abnormal; Notable for the following:    Glucose-Capillary 142 (*)     All other components within normal limits  URINE RAPID  DRUG SCREEN (HOSP PERFORMED)   Ct Head Wo Contrast  08/12/2012  *RADIOLOGY REPORT*  Clinical Data: Right-sided weakness and fatigue.  CT HEAD WITHOUT CONTRAST  Technique:  Contiguous axial images were obtained from the base of the skull through the vertex without contrast.  Comparison: 07/07/2012  Findings: Despite efforts by the patient and technologist, motion artifact is present on some series of today's examination and could not be totally eliminated.  This reduces diagnostic sensitivity and specificity.  Increased CT conspicuity of the left basal ganglia and internal capsule lacunar infarcts noted.  The previously are demonstrated small lacunar infarcts in the periventricular white matter on the left are still relatively inapparent.  No significant associated hemorrhage noted.  No hydrocephalus or intraventricular hemorrhage.  Basilar cisterns unremarkable.  Cerebral atrophy observed.  Incidental failure of fusion of the anterior arch and of the posterolateral arches of C1 noted.  IMPRESSION:  1.  Hypodensities in the left basal ganglia and internal capsule represent increasing ounce conspicuity of the previously demonstrated infarcts from last month.  No definite new infarct compared to the prior MRI examination.  No intracranial hemorrhage or mass lesion observed. 2.  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 3.  Cerebral atrophy.   Original Report Authenticated By: Dellia Cloud, M.D.        1. Weakness   2. TIA (transient ischemic attack)       MDM  61 yo female with brief right arm weakness, h/o CVA, TIA, and carotid disease.  She is on asa, plavix, and has plan for CEA workup this week.  Sxs are back to baseline.  D/w Dr Roseanne Reno, on call for  neurology, who does not have further recommendations/interventions for this TIA given recent evaluation for her CVA.        Olivia Mackie, MD 08/12/12 [phone]

## 2012-08-12 NOTE — ED Notes (Signed)
Live-in boyfriend contacted. Confirmed he will be at home, safe for her arrival. PTAR contacted.

## 2012-08-12 NOTE — ED Notes (Signed)
Pt resting/ sleeping with sonorous resps, easily arousable to voice, interactive, appropriate, calm, NAD, resps e/u, mentions R arm pain with manual BP arm manipulation. VSS. NSR on monitor.

## 2012-08-12 NOTE — ED Notes (Signed)
2 low BPs noted, manual checked (WNL), cuff re-applied, regular cuff R arm.

## 2012-08-12 NOTE — ED Notes (Signed)
Back from CT

## 2012-08-12 NOTE — ED Notes (Signed)
Patient transported to CT 

## 2012-08-14 ENCOUNTER — Encounter (HOSPITAL_COMMUNITY): Payer: Self-pay

## 2012-08-15 ENCOUNTER — Ambulatory Visit: Payer: Self-pay | Admitting: Vascular Surgery

## 2012-08-21 ENCOUNTER — Encounter: Payer: Self-pay | Admitting: Vascular Surgery

## 2012-08-22 ENCOUNTER — Ambulatory Visit: Payer: Self-pay | Admitting: Vascular Surgery

## 2012-08-22 ENCOUNTER — Encounter (HOSPITAL_COMMUNITY): Payer: Self-pay

## 2012-08-30 ENCOUNTER — Ambulatory Visit (HOSPITAL_COMMUNITY): Payer: Medicaid Other | Attending: Cardiology | Admitting: Radiology

## 2012-08-30 VITALS — BP 106/60 | Ht 63.0 in | Wt 201.0 lb

## 2012-08-30 DIAGNOSIS — R0609 Other forms of dyspnea: Secondary | ICD-10-CM | POA: Insufficient documentation

## 2012-08-30 DIAGNOSIS — F17200 Nicotine dependence, unspecified, uncomplicated: Secondary | ICD-10-CM | POA: Insufficient documentation

## 2012-08-30 DIAGNOSIS — I10 Essential (primary) hypertension: Secondary | ICD-10-CM | POA: Insufficient documentation

## 2012-08-30 DIAGNOSIS — R079 Chest pain, unspecified: Secondary | ICD-10-CM

## 2012-08-30 DIAGNOSIS — E119 Type 2 diabetes mellitus without complications: Secondary | ICD-10-CM | POA: Insufficient documentation

## 2012-08-30 DIAGNOSIS — R002 Palpitations: Secondary | ICD-10-CM | POA: Insufficient documentation

## 2012-08-30 DIAGNOSIS — J45909 Unspecified asthma, uncomplicated: Secondary | ICD-10-CM | POA: Insufficient documentation

## 2012-08-30 DIAGNOSIS — Z01818 Encounter for other preprocedural examination: Secondary | ICD-10-CM

## 2012-08-30 DIAGNOSIS — R0989 Other specified symptoms and signs involving the circulatory and respiratory systems: Secondary | ICD-10-CM | POA: Insufficient documentation

## 2012-08-30 DIAGNOSIS — R0602 Shortness of breath: Secondary | ICD-10-CM

## 2012-08-30 MED ORDER — TECHNETIUM TC 99M SESTAMIBI GENERIC - CARDIOLITE
10.0000 | Freq: Once | INTRAVENOUS | Status: AC | PRN
  Administered 2012-08-30: 10 via INTRAVENOUS

## 2012-08-30 MED ORDER — TECHNETIUM TC 99M SESTAMIBI GENERIC - CARDIOLITE
30.0000 | Freq: Once | INTRAVENOUS | Status: AC | PRN
  Administered 2012-08-30: 30 via INTRAVENOUS

## 2012-08-30 MED ORDER — REGADENOSON 0.4 MG/5ML IV SOLN
0.4000 mg | Freq: Once | INTRAVENOUS | Status: AC
  Administered 2012-08-30: 0.4 mg via INTRAVENOUS

## 2012-08-30 NOTE — Progress Notes (Signed)
[facility] SITE 3 NUCLEAR MED [address] [id number] [place] [address] [phone]  Cardiology Nuclear Med Study  Sherri Hays is a 61 y.o. female     MRN : [id number]     DOB: 04-29-1951  Procedure Date: 08/30/2012  Nuclear Med Background Indication for Stress Test:  Evaluation for Ischemia, Pending Surgical Clearance: Right CEA, Dr. Hart [place], and 08/11/12 Post Hospital: Fatigue History:  Asthma, COPD and 9/13 ECHO: EF: 60-65% trivial PE Cardiac Risk Factors: Carotid Disease, CVA, Hypertension, IDDM Type 2, Smoker and TIA  Symptoms:  Chest Pain, DOE, Fatigue, Palpitations and SOB   Nuclear Pre-Procedure Caffeine/Decaff Intake:  None NPO After: 9:30pm   Lungs:  clear O2 Sat: 95% on room air. IV 0.9% NS with Angio Cath:  22g  IV Site: L Forearm  IV Started by:  Stanton Kidney, EMT-P  Chest Size (in):  44 Cup Size: C  Height: 5\' 3"  (1.6 m)  Weight:  201 lb (91.173 kg)  BMI:  Body mass index is 35.61 kg/(m^2). Tech Comments:  CBG=132 @ 12:45    Nuclear Med Study 1 or 2 day study: 1 day  Stress Test Type:  Eugenie Birks  Reading MD: Cassell Clement, MD  Order Authorizing Provider:  J.Lawson  Resting Radionuclide: Technetium 22m Sestamibi  Resting Radionuclide Dose: 11.0 mCi   Stress Radionuclide:  Technetium 76m Sestamibi  Stress Radionuclide Dose: 33.0 mCi           Stress Protocol Rest HR: 76 Stress HR: 106  Rest BP: 106/60 Stress BP: 130/55  Exercise Time (min): n/a METS: n/a   Predicted Max HR: 160 bpm % Max HR: 66.25 bpm Rate Pressure Product: 95638   Dose of Adenosine (mg):  n/a Dose of Lexiscan: 0.4 mg  Dose of Atropine (mg): n/a Dose of Dobutamine: n/a mcg/kg/min (at max HR)  Stress Test Technologist: Milana Na, EMT-P  Nuclear Technologist:  Domenic Polite, CNMT     Rest Procedure:  Myocardial perfusion imaging was performed at rest 45 minutes following the intravenous administration of Technetium 41m Sestamibi. Rest ECG: NSR -  Normal EKG  Stress Procedure:  The patient received IV Lexiscan 0.4 mg over 15-seconds.  Technetium 28m Sestamibi injected at 30-seconds.  There were non specific changes with Lexiscan.  Quantitative spect images were obtained after a 45 minute delay. Stress ECG: No significant ST segment change suggestive of ischemia.  QPS Raw Data Images:  Normal; no motion artifact; normal heart/lung ratio. Stress Images:  Normal homogeneous uptake in all areas of the myocardium. Rest Images:  Normal homogeneous uptake in all areas of the myocardium. Subtraction (SDS):  No evidence of ischemia. Transient Ischemic Dilatation (Normal <1.22):  0.96 Lung/Heart Ratio (Normal <0.45):  0.28  Quantitative Gated Spect Images QGS EDV:  73 ml QGS ESV:  33 ml  Impression Exercise Capacity:  Lexiscan with no exercise. BP Response:  Normal blood pressure response. Clinical Symptoms:  No significant symptoms noted. ECG Impression:  No significant ST segment change suggestive of ischemia. Comparison with Prior Nuclear Study: No images to compare  Overall Impression:  Normal stress nuclear study.  LV Ejection Fraction: 55%.  LV Wall Motion:  NL LV Function; NL Wall Motion  [employer]

## 2012-09-05 ENCOUNTER — Ambulatory Visit: Payer: Self-pay | Admitting: Vascular Surgery

## 2012-09-11 ENCOUNTER — Encounter: Payer: Self-pay | Admitting: Vascular Surgery

## 2012-09-12 ENCOUNTER — Ambulatory Visit: Payer: Self-pay | Admitting: Vascular Surgery

## 2012-09-18 ENCOUNTER — Encounter: Payer: Self-pay | Admitting: Vascular Surgery

## 2012-09-19 ENCOUNTER — Encounter: Payer: Self-pay | Admitting: Vascular Surgery

## 2012-09-19 ENCOUNTER — Ambulatory Visit (INDEPENDENT_AMBULATORY_CARE_PROVIDER_SITE_OTHER): Payer: Medicaid Other | Admitting: Vascular Surgery

## 2012-09-19 ENCOUNTER — Other Ambulatory Visit: Payer: Self-pay

## 2012-09-19 VITALS — BP 182/89 | HR 109 | Resp 22 | Ht 63.0 in | Wt 215.0 lb

## 2012-09-19 DIAGNOSIS — I6529 Occlusion and stenosis of unspecified carotid artery: Secondary | ICD-10-CM | POA: Insufficient documentation

## 2012-09-19 DIAGNOSIS — I63239 Cerebral infarction due to unspecified occlusion or stenosis of unspecified carotid arteries: Secondary | ICD-10-CM

## 2012-09-19 NOTE — Progress Notes (Signed)
Subjective:     Patient ID: Sherri Hays, female   DOB: 11/06/1950, 61 y.o.   MRN: [id number]  HPI this 61-year-old female comes to the office today to discuss right carotid endarterectomy. 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 MRI revealed a stroke in the left middle cerebral artery distribution. CT angiogram revealed left ICA occlusion and 80% right ICA stenosis. In the interim she has had a nuclear medicine stress test which revealed an ejection fraction of 55% and no evidence of ischemia. She has had no new neurologic symptoms. He denies lateralizing weakness, aphasia, amaurosis fugax, diplopia, blurred vision, or syncope.  Past Medical History  Diagnosis Date  . Hypertension   . Dysphagia   . TIA (transient ischemic attack)   . Genital herpes   . DM neuropathies   . Anemia   . Arthritis   . Asthma   . Cancer     lt breast  . Schizophrenia, simple   . Diabetes mellitus   . Stroke     History  Substance Use Topics  . Smoking status: Current Every Day Smoker -- 0.5 packs/day    Types: Cigarettes  . Smokeless tobacco: Never Used  . Alcohol Use: No    No family history on file.  Allergies  Allergen Reactions  . Penicillins Other (See Comments)    unknown    Current outpatient prescriptions:acetaminophen (TYLENOL) 325 MG tablet, Take 2 tablets (650 mg total) by mouth every 6 (six) hours as needed for pain or fever (or Fever >/= 101)., Disp: , Rfl: ;  albuterol (PROVENTIL HFA;VENTOLIN HFA) 108 (90 BASE) MCG/ACT inhaler, Inhale 2 puffs into the lungs every 6 (six) hours as needed. For dyspnea, Disp: 1 Inhaler, Rfl: 11;  amLODipine (NORVASC) 10 MG tablet, Take 10 mg by mouth daily., Disp: , Rfl:  aspirin 81 MG tablet, Take 1 tablet (81 mg total) by mouth daily., Disp: 30 tablet, Rfl: ;  atorvastatin (LIPITOR) 10 MG tablet, Take 1 tablet (10 mg total) by mouth at bedtime. For cholesterol., Disp: , Rfl:  ;  clopidogrel (PLAVIX) 75 MG tablet, Take 1 tablet (75 mg total) by mouth daily with breakfast., Disp: 30 tablet, Rfl: 0;  fluPHENAZine (PROLIXIN) 5 MG tablet, Take 10 mg by mouth at bedtime., Disp: , Rfl:  Fluticasone-Salmeterol (ADVAIR DISKUS) 250-50 MCG/DOSE AEPB, Inhale 1 puff into the lungs 2 (two) times daily. For COPD, Disp: 60 each, Rfl: 11;  gabapentin (NEURONTIN) 300 MG capsule, Take 300 mg by mouth 3 (three) times daily. For anxiety and depression. , Disp: , Rfl: ;  hydrALAZINE (APRESOLINE) 50 MG tablet, Take 1 tablet (50 mg total) by mouth 3 (three) times daily. For elevated blood pressure., Disp: 90 tablet, Rfl: 0 insulin glargine (LANTUS) 100 UNIT/ML injection, Inject 30 Units into the skin at bedtime. For glycemic control., Disp: 10 mL, Rfl: ;  metFORMIN (GLUCOPHAGE) 500 MG tablet, Take 1 tablet (500 mg total) by mouth 2 (two) times daily with a meal. For glycemic control., Disp: , Rfl: ;  oxybutynin (DITROPAN-XL) 10 MG 24 hr tablet, Take 1 tablet (10 mg total) by mouth at bedtime. For overactive bladder., Disp: 30 tablet, Rfl: 0 sertraline (ZOLOFT) 100 MG tablet, Take 2 tablets (200 mg total) by mouth daily. For depression and anxiety., Disp: 60 tablet, Rfl: 0;  traZODone (DESYREL) 100 MG tablet, Take 100 mg by mouth at bedtime., Disp: , Rfl: ;  trihexyphenidyl (  ARTANE) 2 MG tablet, Take 0.5 tablets (1 mg total) by mouth at bedtime. For EPS., Disp: 15 tablet, Rfl: 0  BP 182/89  Pulse 109  Resp 22  Ht 5' 3" (1.6 m)  Wt 215 lb (97.523 kg)  BMI 38.09 kg/m2  Body mass index is 38.09 kg/(m^2).          Review of Systems denies chest pain, dyspnea on exertion, PND, orthopnea, hemoptysis. She does have a remote history of a myocardial infarction 5 years ago but this has not been documented in the history and physical and she is having no cardiac symptoms at the present time. Other systems are negative and complete review of systems     Objective:   Physical Exam blood pressure 182  arrayed 9 heart rate 109 respirations 22 Gen.-alert and oriented x3 in no apparent distress HEENT normal for age Lungs no rhonchi or wheezing Cardiovascular regular rhythm no murmurs carotid pulses 3+ palpable no bruits audible Abdomen soft nontender no palpable masses Musculoskeletal free of  major deformities Skin clear -no rashes Neurologic speaks slowly but clearly. Good strength and sensation bilaterally. Good coordination.  Lower extremities 3+ femoral and dorsalis pedis pulses palpable bilaterally with no edema  Today I reviewed her CT angiogram again by computer and she does have an 80% focal right ICA stenosis and a totally occluded left ICA       Assessment:     3 months status post left brain CVA-middle cerebral artery distribution with left ICA occlusion and tight right ICA stenosis Recent negative nuclear medicine stress test with good ejection fraction at 55%    Plan:     Planned right carotid endarterectomy for prevention of further stroke-risks and benefits thoroughly discussed with patient she would like to proceed Scheduled for Thursday, November 21      

## 2012-09-20 ENCOUNTER — Encounter (HOSPITAL_COMMUNITY)
Admission: RE | Admit: 2012-09-20 | Discharge: 2012-09-20 | Disposition: A | Discharge status: Home / Self Care | Payer: Medicaid Other | Source: Ambulatory Visit | Attending: Vascular Surgery | Admitting: Vascular Surgery

## 2012-09-20 ENCOUNTER — Encounter (HOSPITAL_COMMUNITY): Payer: Self-pay

## 2012-09-20 ENCOUNTER — Encounter (HOSPITAL_COMMUNITY): Payer: Self-pay | Admitting: Pharmacy Technician

## 2012-09-20 HISTORY — DX: Sleep apnea, unspecified: G47.30

## 2012-09-20 HISTORY — DX: Pneumonia, unspecified organism: J18.9

## 2012-09-20 HISTORY — DX: Chronic obstructive pulmonary disease, unspecified: J44.9

## 2012-09-20 LAB — URINALYSIS, ROUTINE W REFLEX MICROSCOPIC
Bilirubin Urine: NEGATIVE
Ketones, ur: NEGATIVE mg/dL
Leukocytes, UA: NEGATIVE
Nitrite: NEGATIVE
Protein, ur: NEGATIVE mg/dL
pH: 6 (ref 5.0–8.0)

## 2012-09-20 LAB — CBC
HCT: 40.5 % (ref 36.0–46.0)
Hemoglobin: 13 g/dL (ref 12.0–15.0)
MCH: 27.1 pg (ref 26.0–34.0)
MCHC: 32.1 g/dL (ref 30.0–36.0)
RDW: 17.4 % — ABNORMAL HIGH (ref 11.5–15.5)

## 2012-09-20 LAB — APTT: aPTT: 28 seconds (ref 24–37)

## 2012-09-20 LAB — ABO/RH: ABO/RH(D): B POS

## 2012-09-20 LAB — COMPREHENSIVE METABOLIC PANEL
Albumin: 3.3 g/dL — ABNORMAL LOW (ref 3.5–5.2)
Alkaline Phosphatase: 94 U/L (ref 39–117)
BUN: 3 mg/dL — ABNORMAL LOW (ref 6–23)
Calcium: 9.2 mg/dL (ref 8.4–10.5)
Creatinine, Ser: 0.53 mg/dL (ref 0.50–1.10)
GFR calc Af Amer: >90 mL/min (ref >90)
Glucose, Bld: 181 mg/dL — ABNORMAL HIGH (ref 70–99)
Potassium: 3.6 mEq/L (ref 3.5–5.1)
Total Protein: 7.8 g/dL (ref 6.0–8.3)

## 2012-09-20 LAB — SURGICAL PCR SCREEN: Staphylococcus aureus: NEGATIVE

## 2012-09-20 MED ORDER — VANCOMYCIN HCL IN DEXTROSE 1-5 GM/200ML-% IV SOLN
1000.0000 mg | INTRAVENOUS | Status: AC
  Administered 2012-09-21: 1000 mg via INTRAVENOUS
  Filled 2012-09-20: qty 200

## 2012-09-20 NOTE — Pre-Procedure Instructions (Signed)
20 ANELYS DURO  09/20/2012   Your procedure is scheduled on:  Thursday September 21, 2012  Report to [facility] at 5:30 AM.  Call this number if you have problems the morning of surgery: [phone]   Remember:   Do not eat food or drink :After Midnight.      Take these medicines the morning of surgery with A SIP OF WATER: tylenol, albuterol (bring with you day of surgery), amlodipine, advair, gabapentin, hydralazine, zoloft, hydroxyzine,   Do not wear jewelry, make-up or nail polish.  Do not wear lotions, powders, or perfumes.   Do not shave 48 hours prior to surgery. Men may shave face and neck.  Do not bring valuables to the hospital.  Contacts, dentures or bridgework may not be worn into surgery.  Leave suitcase in the car. After surgery it may be brought to your room.  For patients admitted to the hospital, checkout time is 11:00 AM the day of discharge.   Patients discharged the day of surgery will not be allowed to drive home.  Name and phone number of your driver: family / friend  Special Instructions: Shower using CHG 2 nights before surgery and the night before surgery.  If you shower the day of surgery use CHG.  Use special wash - you have one bottle of CHG for all showers.  You should use approximately 1/3 of the bottle for each shower.   Please read over the following fact sheets that you were given: Pain Booklet, Coughing and Deep Breathing, Blood Transfusion Information, MRSA Information and Surgical Site Infection Prevention

## 2012-09-20 NOTE — Progress Notes (Signed)
Spoke with Okey Regal with Dr. Candie Chroman office,  Patient can take plavix day of surgery.

## 2012-09-21 ENCOUNTER — Inpatient Hospital Stay (HOSPITAL_COMMUNITY): Payer: Medicaid Other | Admitting: Anesthesiology

## 2012-09-21 ENCOUNTER — Inpatient Hospital Stay (HOSPITAL_COMMUNITY): Payer: Medicaid Other

## 2012-09-21 ENCOUNTER — Encounter (HOSPITAL_COMMUNITY)
Admission: RE | Disposition: A | Discharge status: Skilled Nursing Facility | Payer: Self-pay | Source: Ambulatory Visit | Attending: Vascular Surgery

## 2012-09-21 ENCOUNTER — Encounter (HOSPITAL_COMMUNITY): Payer: Self-pay | Admitting: Anesthesiology

## 2012-09-21 ENCOUNTER — Encounter (HOSPITAL_COMMUNITY): Payer: Self-pay | Admitting: *Deleted

## 2012-09-21 ENCOUNTER — Telehealth: Payer: Self-pay | Admitting: Vascular Surgery

## 2012-09-21 ENCOUNTER — Inpatient Hospital Stay (HOSPITAL_COMMUNITY)
Admission: RE | Admit: 2012-09-21 | Discharge: 2012-09-26 | DRG: 038 | Disposition: A | Discharge status: Skilled Nursing Facility | Payer: Medicaid Other | Source: Ambulatory Visit | Attending: Vascular Surgery | Admitting: Vascular Surgery

## 2012-09-21 DIAGNOSIS — Z79899 Other long term (current) drug therapy: Secondary | ICD-10-CM

## 2012-09-21 DIAGNOSIS — I69998 Other sequelae following unspecified cerebrovascular disease: Secondary | ICD-10-CM

## 2012-09-21 DIAGNOSIS — Z794 Long term (current) use of insulin: Secondary | ICD-10-CM

## 2012-09-21 DIAGNOSIS — E1142 Type 2 diabetes mellitus with diabetic polyneuropathy: Secondary | ICD-10-CM | POA: Diagnosis present

## 2012-09-21 DIAGNOSIS — Z7982 Long term (current) use of aspirin: Secondary | ICD-10-CM

## 2012-09-21 DIAGNOSIS — I252 Old myocardial infarction: Secondary | ICD-10-CM

## 2012-09-21 DIAGNOSIS — G4730 Sleep apnea, unspecified: Secondary | ICD-10-CM | POA: Diagnosis present

## 2012-09-21 DIAGNOSIS — I63239 Cerebral infarction due to unspecified occlusion or stenosis of unspecified carotid arteries: Secondary | ICD-10-CM

## 2012-09-21 DIAGNOSIS — F17200 Nicotine dependence, unspecified, uncomplicated: Secondary | ICD-10-CM | POA: Diagnosis present

## 2012-09-21 DIAGNOSIS — Z01812 Encounter for preprocedural laboratory examination: Secondary | ICD-10-CM

## 2012-09-21 DIAGNOSIS — F329 Major depressive disorder, single episode, unspecified: Secondary | ICD-10-CM | POA: Diagnosis present

## 2012-09-21 DIAGNOSIS — Z853 Personal history of malignant neoplasm of breast: Secondary | ICD-10-CM

## 2012-09-21 DIAGNOSIS — Z880 Allergy status to penicillin: Secondary | ICD-10-CM

## 2012-09-21 DIAGNOSIS — J4489 Other specified chronic obstructive pulmonary disease: Secondary | ICD-10-CM | POA: Diagnosis present

## 2012-09-21 DIAGNOSIS — R1310 Dysphagia, unspecified: Secondary | ICD-10-CM | POA: Diagnosis present

## 2012-09-21 DIAGNOSIS — E1149 Type 2 diabetes mellitus with other diabetic neurological complication: Secondary | ICD-10-CM | POA: Diagnosis present

## 2012-09-21 DIAGNOSIS — F3289 Other specified depressive episodes: Secondary | ICD-10-CM | POA: Diagnosis present

## 2012-09-21 DIAGNOSIS — F2089 Other schizophrenia: Secondary | ICD-10-CM | POA: Diagnosis present

## 2012-09-21 DIAGNOSIS — Z01818 Encounter for other preprocedural examination: Secondary | ICD-10-CM

## 2012-09-21 DIAGNOSIS — I6529 Occlusion and stenosis of unspecified carotid artery: Secondary | ICD-10-CM

## 2012-09-21 DIAGNOSIS — M129 Arthropathy, unspecified: Secondary | ICD-10-CM | POA: Diagnosis present

## 2012-09-21 DIAGNOSIS — R29898 Other symptoms and signs involving the musculoskeletal system: Secondary | ICD-10-CM | POA: Diagnosis present

## 2012-09-21 DIAGNOSIS — I69920 Aphasia following unspecified cerebrovascular disease: Secondary | ICD-10-CM | POA: Diagnosis present

## 2012-09-21 DIAGNOSIS — I10 Essential (primary) hypertension: Secondary | ICD-10-CM | POA: Diagnosis present

## 2012-09-21 DIAGNOSIS — J449 Chronic obstructive pulmonary disease, unspecified: Secondary | ICD-10-CM | POA: Diagnosis present

## 2012-09-21 DIAGNOSIS — F411 Generalized anxiety disorder: Secondary | ICD-10-CM | POA: Diagnosis present

## 2012-09-21 HISTORY — PX: ENDARTERECTOMY: SHX5162

## 2012-09-21 HISTORY — DX: Myoneural disorder, unspecified: G70.9

## 2012-09-21 HISTORY — PX: PATCH ANGIOPLASTY: SHX6230

## 2012-09-21 LAB — GLUCOSE, CAPILLARY
Glucose-Capillary: 271 mg/dL — ABNORMAL HIGH (ref 70–99)
Glucose-Capillary: 152 mg/dL — ABNORMAL HIGH (ref 70–99)
Glucose-Capillary: 193 mg/dL — ABNORMAL HIGH (ref 70–99)

## 2012-09-21 SURGERY — ENDARTERECTOMY, CAROTID
Anesthesia: General | Site: Neck | Laterality: Right | Wound class: Clean

## 2012-09-21 MED ORDER — ACETAMINOPHEN 650 MG RE SUPP
325.0000 mg | RECTAL | Status: DC | PRN
Start: 2012-09-21 — End: 2012-09-26

## 2012-09-21 MED ORDER — PHENYLEPHRINE HCL 10 MG/ML IJ SOLN
10.0000 mg | INTRAVENOUS | Status: DC | PRN
  Administered 2012-09-21: 15 ug/min via INTRAVENOUS

## 2012-09-21 MED ORDER — MOMETASONE FURO-FORMOTEROL FUM 100-5 MCG/ACT IN AERO
2.0000 | INHALATION_SPRAY | Freq: Two times a day (BID) | RESPIRATORY_TRACT | Status: DC
  Administered 2012-09-22 – 2012-09-26 (×8): 2 via RESPIRATORY_TRACT
  Filled 2012-09-21: qty 8.8

## 2012-09-21 MED ORDER — TRIHEXYPHENIDYL HCL 2 MG PO TABS: 1.0000 mg | ORAL_TABLET | Freq: Every day | ORAL | Status: DC

## 2012-09-21 MED ORDER — MIDAZOLAM HCL 2 MG/2ML IJ SOLN: 0.5000 mg | Freq: Once | INTRAMUSCULAR | Status: DC | PRN

## 2012-09-21 MED ORDER — DICLOFENAC SODIUM 25 MG PO TBEC
25.0000 mg | DELAYED_RELEASE_TABLET | Freq: Two times a day (BID) | ORAL | Status: DC
  Filled 2012-09-21 (×2): qty 1

## 2012-09-21 MED ORDER — PHENOL 1.4 % MT LIQD: 1.0000 | OROMUCOSAL | Status: DC | PRN

## 2012-09-21 MED ORDER — DOPAMINE-DEXTROSE 3.2-5 MG/ML-% IV SOLN: 3.0000 ug/kg/min | INTRAVENOUS | Status: DC

## 2012-09-21 MED ORDER — METOPROLOL TARTRATE 1 MG/ML IV SOLN: 2.0000 mg | INTRAVENOUS | Status: DC | PRN

## 2012-09-21 MED ORDER — LIDOCAINE HCL 4 % MT SOLN
OROMUCOSAL | Status: DC | PRN
  Administered 2012-09-21: 4 mL via TOPICAL

## 2012-09-21 MED ORDER — HEPARIN SODIUM (PORCINE) 1000 UNIT/ML IJ SOLN
INTRAMUSCULAR | Status: DC | PRN
  Administered 2012-09-21: 6000 [IU] via INTRAVENOUS

## 2012-09-21 MED ORDER — MEPERIDINE HCL 25 MG/ML IJ SOLN: 6.2500 mg | INTRAMUSCULAR | Status: DC | PRN

## 2012-09-21 MED ORDER — LABETALOL HCL 5 MG/ML IV SOLN
INTRAVENOUS | Status: AC
  Filled 2012-09-21: qty 4

## 2012-09-21 MED ORDER — PROTAMINE SULFATE 10 MG/ML IV SOLN
INTRAVENOUS | Status: DC | PRN
  Administered 2012-09-21 (×2): 20 mg via INTRAVENOUS
  Administered 2012-09-21: 10 mg via INTRAVENOUS

## 2012-09-21 MED ORDER — LIDOCAINE HCL (PF) 1 % IJ SOLN
INTRAMUSCULAR | Status: AC
  Filled 2012-09-21: qty 30

## 2012-09-21 MED ORDER — GUAIFENESIN-DM 100-10 MG/5ML PO SYRP: 15.0000 mL | ORAL_SOLUTION | ORAL | Status: DC | PRN

## 2012-09-21 MED ORDER — SENNOSIDES-DOCUSATE SODIUM 8.6-50 MG PO TABS
1.0000 | ORAL_TABLET | Freq: Every evening | ORAL | Status: DC | PRN
  Filled 2012-09-21: qty 1

## 2012-09-21 MED ORDER — FENTANYL CITRATE 0.05 MG/ML IJ SOLN
INTRAMUSCULAR | Status: DC | PRN
  Administered 2012-09-21: 50 ug via INTRAVENOUS

## 2012-09-21 MED ORDER — SODIUM CHLORIDE 0.9 % IV SOLN: INTRAVENOUS | Status: DC

## 2012-09-21 MED ORDER — OXYCODONE-ACETAMINOPHEN 5-325 MG PO TABS: 1.0000 | ORAL_TABLET | ORAL | Status: DC | PRN

## 2012-09-21 MED ORDER — SODIUM CHLORIDE 0.9 % IR SOLN
Status: DC | PRN
  Administered 2012-09-21: 09:00:00

## 2012-09-21 MED ORDER — ALBUTEROL SULFATE HFA 108 (90 BASE) MCG/ACT IN AERS
2.0000 | INHALATION_SPRAY | Freq: Four times a day (QID) | RESPIRATORY_TRACT | Status: DC | PRN
Start: 2012-09-21 — End: 2012-09-26
  Filled 2012-09-21: qty 6.7

## 2012-09-21 MED ORDER — MAGNESIUM SULFATE 40 MG/ML IJ SOLN
2.0000 g | Freq: Once | INTRAMUSCULAR | Status: AC | PRN
  Filled 2012-09-21: qty 50

## 2012-09-21 MED ORDER — ALUM & MAG HYDROXIDE-SIMETH 200-200-20 MG/5ML PO SUSP: 15.0000 mL | ORAL | Status: DC | PRN

## 2012-09-21 MED ORDER — DICLOFENAC SODIUM 50 MG PO TBEC
50.0000 mg | DELAYED_RELEASE_TABLET | Freq: Every day | ORAL | Status: DC
  Administered 2012-09-22 – 2012-09-26 (×5): 50 mg via ORAL
  Filled 2012-09-21 (×6): qty 1

## 2012-09-21 MED ORDER — INSULIN ASPART 100 UNIT/ML ~~LOC~~ SOLN
0.0000 [IU] | SUBCUTANEOUS | Status: DC
  Administered 2012-09-21: 4 [IU] via SUBCUTANEOUS
  Administered 2012-09-21: 7 [IU] via SUBCUTANEOUS
  Administered 2012-09-21: 4 [IU] via SUBCUTANEOUS
  Administered 2012-09-22 – 2012-09-23 (×6): 3 [IU] via SUBCUTANEOUS

## 2012-09-21 MED ORDER — 0.9 % SODIUM CHLORIDE (POUR BTL) OPTIME
TOPICAL | Status: DC | PRN
  Administered 2012-09-21: 2000 mL

## 2012-09-21 MED ORDER — ASPIRIN EC 325 MG PO TBEC
325.0000 mg | DELAYED_RELEASE_TABLET | Freq: Every day | ORAL | Status: DC
  Administered 2012-09-21 – 2012-09-26 (×6): 325 mg via ORAL
  Filled 2012-09-21 (×6): qty 1

## 2012-09-21 MED ORDER — OXYCODONE HCL 5 MG PO TABS: 5.0000 mg | ORAL_TABLET | Freq: Once | ORAL | Status: DC | PRN

## 2012-09-21 MED ORDER — ATORVASTATIN CALCIUM 10 MG PO TABS
10.0000 mg | ORAL_TABLET | Freq: Every day | ORAL | Status: DC
  Administered 2012-09-21 – 2012-09-25 (×4): 10 mg via ORAL
  Filled 2012-09-21 (×6): qty 1

## 2012-09-21 MED ORDER — ALBUTEROL SULFATE (5 MG/ML) 0.5% IN NEBU
2.5000 mg | INHALATION_SOLUTION | RESPIRATORY_TRACT | Status: AC
  Administered 2012-09-21: 2.5 mg via RESPIRATORY_TRACT

## 2012-09-21 MED ORDER — SERTRALINE HCL 100 MG PO TABS
200.0000 mg | ORAL_TABLET | Freq: Every day | ORAL | Status: DC
  Administered 2012-09-21 – 2012-09-26 (×6): 200 mg via ORAL
  Filled 2012-09-21 (×6): qty 2

## 2012-09-21 MED ORDER — ACETAMINOPHEN 325 MG PO TABS: 650.0000 mg | ORAL_TABLET | Freq: Four times a day (QID) | ORAL | Status: DC | PRN

## 2012-09-21 MED ORDER — SODIUM CHLORIDE 0.9 % IJ SOLN
10.0000 mL | INTRAMUSCULAR | Status: DC | PRN
  Filled 2012-09-21: qty 20

## 2012-09-21 MED ORDER — PANTOPRAZOLE SODIUM 40 MG PO TBEC
40.0000 mg | DELAYED_RELEASE_TABLET | Freq: Every day | ORAL | Status: DC
  Administered 2012-09-22 – 2012-09-26 (×5): 40 mg via ORAL
  Filled 2012-09-21 (×5): qty 1

## 2012-09-21 MED ORDER — PROMETHAZINE HCL 25 MG/ML IJ SOLN: 6.2500 mg | INTRAMUSCULAR | Status: DC | PRN

## 2012-09-21 MED ORDER — HYDRALAZINE HCL 20 MG/ML IJ SOLN
10.0000 mg | INTRAMUSCULAR | Status: DC | PRN
  Administered 2012-09-24: 06:00:00 via INTRAVENOUS
  Filled 2012-09-21: qty 0.5

## 2012-09-21 MED ORDER — FENTANYL CITRATE 0.05 MG/ML IJ SOLN
INTRAMUSCULAR | Status: AC
  Filled 2012-09-21: qty 2

## 2012-09-21 MED ORDER — ACETAMINOPHEN 325 MG PO TABS: 325.0000 mg | ORAL_TABLET | ORAL | Status: DC | PRN

## 2012-09-21 MED ORDER — HALOPERIDOL DECANOATE 100 MG/ML IM SOLN: 75.0000 mg | INTRAMUSCULAR | Status: DC

## 2012-09-21 MED ORDER — INSULIN GLARGINE 100 UNIT/ML ~~LOC~~ SOLN
30.0000 [IU] | Freq: Every day | SUBCUTANEOUS | Status: DC
  Administered 2012-09-21 – 2012-09-25 (×4): 30 [IU] via SUBCUTANEOUS

## 2012-09-21 MED ORDER — METFORMIN HCL 500 MG PO TABS
500.0000 mg | ORAL_TABLET | Freq: Two times a day (BID) | ORAL | Status: DC
  Administered 2012-09-22 – 2012-09-26 (×9): 500 mg via ORAL
  Filled 2012-09-21 (×12): qty 1

## 2012-09-21 MED ORDER — SODIUM CHLORIDE 0.9 % IV SOLN: 500.0000 mL | Freq: Once | INTRAVENOUS | Status: AC | PRN

## 2012-09-21 MED ORDER — NITROGLYCERIN IN D5W 200-5 MCG/ML-% IV SOLN
INTRAVENOUS | Status: DC | PRN
  Administered 2012-09-21: 40 ug/min via INTRAVENOUS

## 2012-09-21 MED ORDER — LABETALOL HCL 5 MG/ML IV SOLN
INTRAVENOUS | Status: DC | PRN
  Administered 2012-09-21 (×4): 5 mg via INTRAVENOUS

## 2012-09-21 MED ORDER — TRAZODONE HCL 100 MG PO TABS
100.0000 mg | ORAL_TABLET | Freq: Every day | ORAL | Status: DC
  Administered 2012-09-23 – 2012-09-25 (×3): 100 mg via ORAL
  Filled 2012-09-21 (×6): qty 1

## 2012-09-21 MED ORDER — BISACODYL 10 MG RE SUPP: 10.0000 mg | Freq: Every day | RECTAL | Status: DC | PRN

## 2012-09-21 MED ORDER — HYDROXYZINE HCL 25 MG PO TABS
25.0000 mg | ORAL_TABLET | ORAL | Status: DC | PRN
  Filled 2012-09-21: qty 1

## 2012-09-21 MED ORDER — LACTATED RINGERS IV SOLN
INTRAVENOUS | Status: DC | PRN
  Administered 2012-09-21 (×2): via INTRAVENOUS

## 2012-09-21 MED ORDER — OXYCODONE-ACETAMINOPHEN 5-325 MG PO TABS: 1.0000 | ORAL_TABLET | ORAL | Status: AC | PRN

## 2012-09-21 MED ORDER — LABETALOL HCL 5 MG/ML IV SOLN
10.0000 mg | INTRAVENOUS | Status: DC | PRN
  Administered 2012-09-21: 10 mg via INTRAVENOUS
  Filled 2012-09-21: qty 4

## 2012-09-21 MED ORDER — ONDANSETRON HCL 4 MG/2ML IJ SOLN
INTRAMUSCULAR | Status: DC | PRN
  Administered 2012-09-21: 4 mg via INTRAVENOUS

## 2012-09-21 MED ORDER — AMLODIPINE BESYLATE 10 MG PO TABS
10.0000 mg | ORAL_TABLET | Freq: Every day | ORAL | Status: DC
  Administered 2012-09-21 – 2012-09-26 (×6): 10 mg via ORAL
  Filled 2012-09-21 (×6): qty 1

## 2012-09-21 MED ORDER — GABAPENTIN 300 MG PO CAPS
300.0000 mg | ORAL_CAPSULE | Freq: Three times a day (TID) | ORAL | Status: DC
  Administered 2012-09-22 – 2012-09-26 (×12): 300 mg via ORAL
  Filled 2012-09-21 (×17): qty 1

## 2012-09-21 MED ORDER — ROCURONIUM BROMIDE 100 MG/10ML IV SOLN
INTRAVENOUS | Status: DC | PRN
  Administered 2012-09-21: 50 mg via INTRAVENOUS

## 2012-09-21 MED ORDER — OXYBUTYNIN CHLORIDE ER 10 MG PO TB24
10.0000 mg | ORAL_TABLET | Freq: Every day | ORAL | Status: DC
  Administered 2012-09-21 – 2012-09-26 (×6): 10 mg via ORAL
  Filled 2012-09-21 (×6): qty 1

## 2012-09-21 MED ORDER — MORPHINE SULFATE 2 MG/ML IJ SOLN: 2.0000 mg | INTRAMUSCULAR | Status: DC | PRN

## 2012-09-21 MED ORDER — SODIUM CHLORIDE 0.9 % IJ SOLN
10.0000 mL | Freq: Two times a day (BID) | INTRAMUSCULAR | Status: DC
  Administered 2012-09-22: 20 mL
  Administered 2012-09-22 – 2012-09-26 (×2): 10 mL
  Filled 2012-09-21: qty 20

## 2012-09-21 MED ORDER — ONDANSETRON HCL 4 MG/2ML IJ SOLN: 4.0000 mg | Freq: Four times a day (QID) | INTRAMUSCULAR | Status: DC | PRN

## 2012-09-21 MED ORDER — POTASSIUM CHLORIDE CRYS ER 20 MEQ PO TBCR: 20.0000 meq | EXTENDED_RELEASE_TABLET | Freq: Once | ORAL | Status: AC | PRN

## 2012-09-21 MED ORDER — ALBUTEROL SULFATE (5 MG/ML) 0.5% IN NEBU
INHALATION_SOLUTION | RESPIRATORY_TRACT | Status: AC
  Filled 2012-09-21: qty 0.5

## 2012-09-21 MED ORDER — PROPOFOL 10 MG/ML IV BOLUS
INTRAVENOUS | Status: DC | PRN
  Administered 2012-09-21: 200 mg via INTRAVENOUS
  Administered 2012-09-21: 40 mg via INTRAVENOUS

## 2012-09-21 MED ORDER — HYDRALAZINE HCL 50 MG PO TABS
50.0000 mg | ORAL_TABLET | Freq: Three times a day (TID) | ORAL | Status: DC
  Administered 2012-09-21 – 2012-09-26 (×14): 50 mg via ORAL
  Filled 2012-09-21 (×17): qty 1

## 2012-09-21 MED ORDER — FLUPHENAZINE HCL 10 MG PO TABS
10.0000 mg | ORAL_TABLET | Freq: Every day | ORAL | Status: DC
  Administered 2012-09-21 – 2012-09-25 (×4): 10 mg via ORAL
  Filled 2012-09-21 (×6): qty 1

## 2012-09-21 MED ORDER — VANCOMYCIN HCL IN DEXTROSE 1-5 GM/200ML-% IV SOLN
1000.0000 mg | Freq: Once | INTRAVENOUS | Status: AC
  Administered 2012-09-21: 1000 mg via INTRAVENOUS
  Filled 2012-09-21: qty 200

## 2012-09-21 MED ORDER — ASPIRIN 81 MG PO TABS: 81.0000 mg | ORAL_TABLET | Freq: Every day | ORAL | Status: DC

## 2012-09-21 MED ORDER — CLOPIDOGREL BISULFATE 75 MG PO TABS
75.0000 mg | ORAL_TABLET | Freq: Every day | ORAL | Status: DC
  Administered 2012-09-22 – 2012-09-26 (×5): 75 mg via ORAL
  Filled 2012-09-21 (×6): qty 1

## 2012-09-21 MED ORDER — DOCUSATE SODIUM 100 MG PO CAPS
100.0000 mg | ORAL_CAPSULE | Freq: Every day | ORAL | Status: DC
  Administered 2012-09-22 – 2012-09-26 (×3): 100 mg via ORAL
  Filled 2012-09-21 (×5): qty 1

## 2012-09-21 MED ORDER — TRIHEXYPHENIDYL HCL 2 MG PO TABS
1.0000 mg | ORAL_TABLET | Freq: Every day | ORAL | Status: DC
  Administered 2012-09-21: 1 mg via ORAL
  Administered 2012-09-23: 21:00:00 via ORAL
  Administered 2012-09-24 – 2012-09-25 (×2): 1 mg via ORAL
  Filled 2012-09-21 (×7): qty 1

## 2012-09-21 MED ORDER — OXYCODONE HCL 5 MG/5ML PO SOLN: 5.0000 mg | Freq: Once | ORAL | Status: DC | PRN

## 2012-09-21 MED ORDER — SODIUM CHLORIDE 0.9 % IV SOLN
INTRAVENOUS | Status: DC
  Administered 2012-09-21 – 2012-09-22 (×4): via INTRAVENOUS
  Administered 2012-09-23: 75 mL/h via INTRAVENOUS

## 2012-09-21 MED ORDER — ALBUTEROL SULFATE HFA 108 (90 BASE) MCG/ACT IN AERS
INHALATION_SPRAY | RESPIRATORY_TRACT | Status: DC | PRN
  Administered 2012-09-21: 4 via RESPIRATORY_TRACT

## 2012-09-21 MED ORDER — FENTANYL CITRATE 0.05 MG/ML IJ SOLN: 25.0000 ug | INTRAMUSCULAR | Status: DC | PRN

## 2012-09-21 SURGICAL SUPPLY — 45 items
CANISTER SUCTION 2500CC (MISCELLANEOUS) ×2 IMPLANT
CATH ROBINSON RED A/P 18FR (CATHETERS) ×2 IMPLANT
CATH SUCT 10FR WHISTLE TIP (CATHETERS) ×2 IMPLANT
CLIP TI MEDIUM 24 (CLIP) ×2 IMPLANT
CLIP TI WIDE RED SMALL 24 (CLIP) ×2 IMPLANT
CLOTH BEACON ORANGE TIMEOUT ST (SAFETY) ×2 IMPLANT
COVER SURGICAL LIGHT HANDLE (MISCELLANEOUS) ×2 IMPLANT
CRADLE DONUT ADULT HEAD (MISCELLANEOUS) ×2 IMPLANT
DECANTER SPIKE VIAL GLASS SM (MISCELLANEOUS) IMPLANT
DRAIN HEMOVAC 1/8 X 5 (WOUND CARE) IMPLANT
DRAPE WARM FLUID 44X44 (DRAPE) ×2 IMPLANT
DRSG COVADERM 4X8 (GAUZE/BANDAGES/DRESSINGS) ×2 IMPLANT
ELECT REM PT RETURN 9FT ADLT (ELECTROSURGICAL) ×2
ELECTRODE REM PT RTRN 9FT ADLT (ELECTROSURGICAL) ×1 IMPLANT
EVACUATOR SILICONE 100CC (DRAIN) IMPLANT
GLOVE BIOGEL PI IND STRL 6.5 (GLOVE) ×3 IMPLANT
GLOVE BIOGEL PI IND STRL 7.5 (GLOVE) ×4 IMPLANT
GLOVE BIOGEL PI INDICATOR 6.5 (GLOVE) ×3
GLOVE BIOGEL PI INDICATOR 7.5 (GLOVE) ×4
GLOVE ORTHOPEDIC STR SZ6.5 (GLOVE) ×2 IMPLANT
GLOVE SS BIOGEL STRL SZ 7 (GLOVE) ×2 IMPLANT
GLOVE SUPERSENSE BIOGEL SZ 7 (GLOVE) ×2
GLOVE SURG SS PI 7.5 STRL IVOR (GLOVE) ×6 IMPLANT
GOWN PREVENTION PLUS XLARGE (GOWN DISPOSABLE) ×2 IMPLANT
GOWN STRL NON-REIN LRG LVL3 (GOWN DISPOSABLE) ×8 IMPLANT
INSERT FOGARTY SM (MISCELLANEOUS) ×2 IMPLANT
KIT BASIN OR (CUSTOM PROCEDURE TRAY) ×2 IMPLANT
KIT ROOM TURNOVER OR (KITS) ×2 IMPLANT
NEEDLE 22X1 1/2 (OR ONLY) (NEEDLE) IMPLANT
NS IRRIG 1000ML POUR BTL (IV SOLUTION) ×4 IMPLANT
PACK CAROTID (CUSTOM PROCEDURE TRAY) ×2 IMPLANT
PAD ARMBOARD 7.5X6 YLW CONV (MISCELLANEOUS) ×4 IMPLANT
PATCH HEMASHIELD 8X75 (Vascular Products) ×2 IMPLANT
SHUNT CAROTID BYPASS 12FRX15.5 (VASCULAR PRODUCTS) IMPLANT
SPECIMEN JAR SMALL (MISCELLANEOUS) ×2 IMPLANT
SUT PROLENE 6 0 BV (SUTURE) ×10 IMPLANT
SUT PROLENE 6 0 CC (SUTURE) ×2 IMPLANT
SUT SILK 2 0 FS (SUTURE) ×2 IMPLANT
SUT VIC AB 2-0 CT1 27 (SUTURE) ×1
SUT VIC AB 2-0 CT1 TAPERPNT 27 (SUTURE) ×1 IMPLANT
SUT VIC AB 3-0 X1 27 (SUTURE) ×2 IMPLANT
SYR CONTROL 10ML LL (SYRINGE) IMPLANT
TOWEL OR 17X24 6PK STRL BLUE (TOWEL DISPOSABLE) ×2 IMPLANT
TOWEL OR 17X26 10 PK STRL BLUE (TOWEL DISPOSABLE) ×2 IMPLANT
WATER STERILE IRR 1000ML POUR (IV SOLUTION) ×2 IMPLANT

## 2012-09-21 NOTE — Progress Notes (Signed)
ANTIBIOTIC CONSULT NOTE - INITIAL  Pharmacy Consult for Vancomycin Indication: Post-op prophylaxis  Allergies  Allergen Reactions  . Penicillins Other (See Comments)    unknown    Patient Measurements: Height: 5\' 3"  (160 cm) Weight: 219 lb 5.7 oz (99.5 kg) IBW/kg (Calculated) : 52.4  Adjusted Body Weight:   Vital Signs: Temp: 97.8 F (36.6 C) (11/21 1255) Temp src: Oral (11/21 1255) BP: 163/85 mmHg (11/21 1255) Pulse Rate: 92  (11/21 1255) Intake/Output from previous day:   Intake/Output from this shift: Total I/O In: 1450 [I.V.:1450] Out: 450 [Urine:250; Blood:200]  Labs:  Basename 09/20/12 1144  WBC 13.2*  HGB 13.0  PLT 373  LABCREA --  CREATININE 0.53   Estimated Creatinine Clearance: 84.1 ml/min (by C-G formula based on Cr of 0.53). No results found for this basename: VANCOTROUGH:2,VANCOPEAK:2,VANCORANDOM:2,GENTTROUGH:2,GENTPEAK:2,GENTRANDOM:2,TOBRATROUGH:2,TOBRAPEAK:2,TOBRARND:2,AMIKACINPEAK:2,AMIKACINTROU:2,AMIKACIN:2, in the last 72 hours   Microbiology: Recent Results (from the past 720 hour(s))  SURGICAL PCR SCREEN     Status: Normal   Collection Time   09/20/12 11:58 AM      Component Value Range Status Comment   MRSA, PCR NEGATIVE  NEGATIVE Final    Staphylococcus aureus NEGATIVE  NEGATIVE Final     Medical History: Past Medical History  Diagnosis Date  . Dysphagia   . TIA (transient ischemic attack)   . Genital herpes   . DM neuropathies   . Anemia   . Arthritis   . Asthma   . Cancer     lt breast  . Schizophrenia, simple   . Diabetes mellitus   . Pneumonia     hx of  . Sleep apnea     "wears cpap, unsure of settings"  . Stroke     2011  . Neuromuscular disorder     diabetic neuropathy  . COPD (chronic obstructive pulmonary disease)   . Hypertension     physician home visits, Dr. Redmond School [phone]    Medications:  Scheduled:    . [COMPLETED] albuterol  2.5 mg Nebulization Q4H  . albuterol      . amLODipine  10 mg Oral  Daily  . aspirin EC  325 mg Oral Daily  . atorvastatin  10 mg Oral QHS  . clopidogrel  75 mg Oral Q breakfast  . diclofenac  25 mg Oral BID  . docusate sodium  100 mg Oral Daily  . fluPHENAZine  10 mg Oral QHS  . gabapentin  300 mg Oral TID  . hydrALAZINE  50 mg Oral TID  . insulin aspart  0-20 Units Subcutaneous Q4H  . insulin glargine  30 Units Subcutaneous QHS  . labetalol      . metFORMIN  500 mg Oral BID WC  . mometasone-formoterol  2 puff Inhalation BID  . oxybutynin  10 mg Oral Daily  . pantoprazole  40 mg Oral Daily  . sertraline  200 mg Oral Daily  . traZODone  100 mg Oral QHS  . trihexyphenidyl  1 mg Oral QHS  . [COMPLETED] vancomycin  1,000 mg Intravenous 60 min Pre-Op  . [DISCONTINUED] aspirin  81 mg Oral Daily  . [DISCONTINUED] haloperidol decanoate  75 mg Intramuscular Q28 days  . [DISCONTINUED] trihexyphenidyl  1 mg Oral QHS   Assessment: 61yo female s/p R-CEA, with consult for pharmacy to dose post-op Vancomycin.  Spoke with [place], [place]- to receive x 1 more dose.  Pt received Vancomycin 1000mg  IV x 1 at 0900.  Goal of Therapy:  prevention of infection  Plan:  1.  Vancomycin  1000mg  IV x 1 at 2100  Marisue Humble, PharmD Clinical Pharmacist [facility]

## 2012-09-21 NOTE — Transfer of Care (Signed)
Immediate Anesthesia Transfer of Care Note  Patient: Sherri Hays  Procedure(s) Performed: Procedure(s) (LRB) with comments: ENDARTERECTOMY CAROTID (Right) PATCH ANGIOPLASTY (Right) - with dacron patch angioplasty  Patient Location: PACU  Anesthesia Type:General  Level of Consciousness: patient cooperative, lethargic and responds to stimulation  Airway & Oxygen Therapy: Patient Spontanous Breathing and Patient connected to face mask oxygen  Post-op Assessment: Report given to PACU RN, Post -op Vital signs reviewed and stable and Patient moving all extremities  Post vital signs: Reviewed and stable  Complications: No apparent anesthesia complications

## 2012-09-21 NOTE — Progress Notes (Signed)
Pt. Refused cpap for tonight. RT consulted with RN and RN stated that pt. Wears cpap inconsistently at home. Pt. Is tolerating the oxygen at this time. RT and RN will continue to monitor pt.

## 2012-09-21 NOTE — Interval H&P Note (Signed)
History and Physical Interval Note:  09/21/2012 7:51 AM  Sherri Hays  has presented today for surgery, with the diagnosis of Severe Right Internal Carotid Artery Stenosis  The various methods of treatment have been discussed with the patient and family. After consideration of risks, benefits and other options for treatment, the patient has consented to  Procedure(s) (LRB) with comments: ENDARTERECTOMY CAROTID (Right) as a surgical intervention .  The patient's history has been reviewed, patient examined, no change in status, stable for surgery.  I have reviewed the patient's chart and labs.  Questions were answered to the patient's satisfaction.     [name]

## 2012-09-21 NOTE — Anesthesia Postprocedure Evaluation (Signed)
  Anesthesia Post-op Note  Patient: Sherri Hays  Procedure(s) Performed: Procedure(s) (LRB) with comments: ENDARTERECTOMY CAROTID (Right) PATCH ANGIOPLASTY (Right) - with dacron patch angioplasty  Patient Location: PACU  Anesthesia Type:General  Level of Consciousness: sedated, patient cooperative and responds to stimulation and voice  Airway and Oxygen Therapy: Patient Spontanous Breathing and Patient connected to nasal cannula oxygen  Post-op Pain: none  Post-op Assessment: Post-op Vital signs reviewed, Patient's Cardiovascular Status Stable, Respiratory Function Stable, Patent Airway, No signs of Nausea or vomiting and Pain level controlled  Post-op Vital Signs: Reviewed and stable  Complications: No apparent anesthesia complications

## 2012-09-21 NOTE — Progress Notes (Signed)
Pt admitted to 3300 from PACU. Oriented to unit and room. Safety discussed and call bell with in reach. VSS. Patient is very lethargic, bed alarm is set.   Elijah Birk, RN

## 2012-09-21 NOTE — Telephone Encounter (Addendum)
Message copied by Shari Prows on Thu Sep 21, 2012  4:34 PM ------      Message from: Melene Plan      Created: Thu Sep 21, 2012  3:43 PM                   ----- Message -----         From: [name], [place]         Sent: 09/21/2012   1:34 PM           To: Melene Plan, RN            2 week F/U CEA Hart [place]  I scheduled an appt for the above patient on 10/03/12 at 3:15pm. I mailed a letter to the patient and also left a message for her about this appt.awt

## 2012-09-21 NOTE — Progress Notes (Signed)
Utilization review completed.  

## 2012-09-21 NOTE — H&P (View-Only) (Signed)
Subjective:     Patient ID: Sherri Hays, female   DOB: [birth year], 61 y.o.   MRN: [id number]  HPI this 61 year old female comes to the office today to discuss right carotid endarterectomy. 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 MRI revealed a stroke in the left middle cerebral artery distribution. CT angiogram revealed left ICA occlusion and 80% right ICA stenosis. In the interim she has had a nuclear medicine stress test which revealed an ejection fraction of 55% and no evidence of ischemia. She has had no new neurologic symptoms. He denies lateralizing weakness, aphasia, amaurosis fugax, diplopia, blurred vision, or syncope.  Past Medical History  Diagnosis Date  . Hypertension   . Dysphagia   . TIA (transient ischemic attack)   . Genital herpes   . DM neuropathies   . Anemia   . Arthritis   . Asthma   . Cancer     lt breast  . Schizophrenia, simple   . Diabetes mellitus   . Stroke     History  Substance Use Topics  . Smoking status: Current Every Day Smoker -- 0.5 packs/day    Types: Cigarettes  . Smokeless tobacco: Never Used  . Alcohol Use: No    No family history on file.  Allergies  Allergen Reactions  . Penicillins Other (See Comments)    unknown    Current outpatient prescriptions:acetaminophen (TYLENOL) 325 MG tablet, Take 2 tablets (650 mg total) by mouth every 6 (six) hours as needed for pain or fever (or Fever >/= 101)., Disp: , Rfl: ;  albuterol (PROVENTIL HFA;VENTOLIN HFA) 108 (90 BASE) MCG/ACT inhaler, Inhale 2 puffs into the lungs every 6 (six) hours as needed. For dyspnea, Disp: 1 Inhaler, Rfl: 11;  amLODipine (NORVASC) 10 MG tablet, Take 10 mg by mouth daily., Disp: , Rfl:  aspirin 81 MG tablet, Take 1 tablet (81 mg total) by mouth daily., Disp: 30 tablet, Rfl: ;  atorvastatin (LIPITOR) 10 MG tablet, Take 1 tablet (10 mg total) by mouth at bedtime. For cholesterol., Disp: , Rfl:  ;  clopidogrel (PLAVIX) 75 MG tablet, Take 1 tablet (75 mg total) by mouth daily with breakfast., Disp: 30 tablet, Rfl: 0;  fluPHENAZine (PROLIXIN) 5 MG tablet, Take 10 mg by mouth at bedtime., Disp: , Rfl:  Fluticasone-Salmeterol (ADVAIR DISKUS) 250-50 MCG/DOSE AEPB, Inhale 1 puff into the lungs 2 (two) times daily. For COPD, Disp: 60 each, Rfl: 11;  gabapentin (NEURONTIN) 300 MG capsule, Take 300 mg by mouth 3 (three) times daily. For anxiety and depression. , Disp: , Rfl: ;  hydrALAZINE (APRESOLINE) 50 MG tablet, Take 1 tablet (50 mg total) by mouth 3 (three) times daily. For elevated blood pressure., Disp: 90 tablet, Rfl: 0 insulin glargine (LANTUS) 100 UNIT/ML injection, Inject 30 Units into the skin at bedtime. For glycemic control., Disp: 10 mL, Rfl: ;  metFORMIN (GLUCOPHAGE) 500 MG tablet, Take 1 tablet (500 mg total) by mouth 2 (two) times daily with a meal. For glycemic control., Disp: , Rfl: ;  oxybutynin (DITROPAN-XL) 10 MG 24 hr tablet, Take 1 tablet (10 mg total) by mouth at bedtime. For overactive bladder., Disp: 30 tablet, Rfl: 0 sertraline (ZOLOFT) 100 MG tablet, Take 2 tablets (200 mg total) by mouth daily. For depression and anxiety., Disp: 60 tablet, Rfl: 0;  traZODone (DESYREL) 100 MG tablet, Take 100 mg by mouth at bedtime., Disp: , Rfl: ;  trihexyphenidyl (  ARTANE) 2 MG tablet, Take 0.5 tablets (1 mg total) by mouth at bedtime. For EPS., Disp: 15 tablet, Rfl: 0  BP 182/89  Pulse 109  Resp 22  Ht 5\' 3"  (1.6 m)  Wt 215 lb (97.523 kg)  BMI 38.09 kg/m2  Body mass index is 38.09 kg/(m^2).          Review of Systems denies chest pain, dyspnea on exertion, PND, orthopnea, hemoptysis. She does have a remote history of a myocardial infarction 5 years ago but this has not been documented in the history and physical and she is having no cardiac symptoms at the present time. Other systems are negative and complete review of systems     Objective:   Physical Exam blood pressure 182  arrayed 9 heart rate 109 respirations 22 Gen.-alert and oriented x3 in no apparent distress HEENT normal for age Lungs no rhonchi or wheezing Cardiovascular regular rhythm no murmurs carotid pulses 3+ palpable no bruits audible Abdomen soft nontender no palpable masses Musculoskeletal free of  major deformities Skin clear -no rashes Neurologic speaks slowly but clearly. Good strength and sensation bilaterally. Good coordination.  Lower extremities 3+ femoral and dorsalis pedis pulses palpable bilaterally with no edema  Today I reviewed her CT angiogram again by computer and she does have an 80% focal right ICA stenosis and a totally occluded left ICA       Assessment:     3 months status post left brain CVA-middle cerebral artery distribution with left ICA occlusion and tight right ICA stenosis Recent negative nuclear medicine stress test with good ejection fraction at 55%    Plan:     Planned right carotid endarterectomy for prevention of further stroke-risks and benefits thoroughly discussed with patient she would like to proceed Scheduled for Thursday, November 21

## 2012-09-21 NOTE — Anesthesia Procedure Notes (Addendum)
Procedure Name: Intubation Date/Time: 09/21/2012 8:50 AM Performed by: Jerilee Hoh Pre-anesthesia Checklist: Patient identified, Emergency Drugs available, Suction available and Patient being monitored Patient Re-evaluated:Patient Re-evaluated prior to inductionOxygen Delivery Method: Circle system utilized Preoxygenation: Pre-oxygenation with 100% oxygen Intubation Type: IV induction Ventilation: Mask ventilation without difficulty and Oral airway inserted - appropriate to patient size Laryngoscope Size: Mac and 3 Grade View: Grade I Tube type: Oral Tube size: 7.5 mm Number of attempts: 2 Airway Equipment and Method: Stylet and LTA kit utilized Placement Confirmation: ETT inserted through vocal cords under direct vision,  positive ETCO2 and breath sounds checked- equal and bilateral Secured at: 22 cm Tube secured with: Tape Dental Injury: Teeth and Oropharynx as per pre-operative assessment  Comments: Easy mask airway.  DL x 1, attempted to place ETT but met resistance as patient was not completely paralyzed. Resumed mask ventilation.  DL x 1, Grade 1 view, Atraumatic intubation.     CVP: Timeout, sterile prep, drape, FBP R neck.  Trendelenburg position.  1% lido local, finder and trocar RIJ 1st pass with US guidance.  2 lumen placed over J wire. Biopatch and sterile dressing on.  Patient tolerated well.  VSS.  Sandford Craze, MD

## 2012-09-21 NOTE — Anesthesia Preprocedure Evaluation (Signed)
Anesthesia Evaluation  Patient identified by MRN, date of birth, ID band Patient awake    Reviewed: Allergy & Precautions, H&P , NPO status , Patient's Chart, lab work & pertinent test results  History of Anesthesia Complications Negative for: history of anesthetic complications  Airway Mallampati: II TM Distance: >3 FB Neck ROM: Full    Dental  (+) Edentulous Upper and Edentulous Lower   Pulmonary asthma , sleep apnea and Continuous Positive Airway Pressure Ventilation , pneumonia -, COPD COPD inhaler and oxygen dependent, Current Smoker, PE breath sounds clear to auscultation  Pulmonary exam normal       Cardiovascular hypertension, + CAD and + Peripheral Vascular Disease (carotid artery occlusion) Rhythm:Regular Rate:Normal  10/13 stress test: no ischemia, EF 55%   Neuro/Psych PSYCHIATRIC DISORDERS Depression Schizophrenia TIACVA, Residual Symptoms    GI/Hepatic negative GI ROS, Neg liver ROS,   Endo/Other  diabetes (glu 180 @0700 ), Well Controlled, Type 2, Oral Hypoglycemic Agents and Insulin DependentMorbid obesity  Renal/GU negative Renal ROS     Musculoskeletal   Abdominal (+) + obese,   Peds  Hematology negative hematology ROS (+)   Anesthesia Other Findings   Reproductive/Obstetrics                           Anesthesia Physical Anesthesia Plan  ASA: III  Anesthesia Plan: General   Post-op Pain Management:    Induction: Intravenous  Airway Management Planned: Oral ETT  Additional Equipment: Arterial line  Intra-op Plan:   Post-operative Plan: Extubation in OR  Informed Consent: I have reviewed the patients History and Physical, chart, labs and discussed the procedure including the risks, benefits and alternatives for the proposed anesthesia with the patient or authorized representative who has indicated his/her understanding and acceptance.     Plan Discussed with: CRNA  and Surgeon  Anesthesia Plan Comments: (Plan routine monitors, A line, GETA)        Anesthesia Quick Evaluation

## 2012-09-21 NOTE — Preoperative (Signed)
Beta Blockers   Reason not to administer Beta Blockers:Not Applicable 

## 2012-09-21 NOTE — Op Note (Signed)
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 Central line was inserted in the left IJ by anesthesia. Right neck was prepped with Betadine scrub and solution draped in routine sterile manner. Decision was made along the anterior border of the sternocleidomastoid muscle carried down through subcutaneous tissue and platysma using the Bovie. Common facial vein and external jugular veins are ligated with 3-0 silk ties and divided. The common internal and external carotid arteries were dissected free. There was severe redundancy of not only the common but also more significantly the internal carotid artery which clearly would necessitate resecting vessel to straighten this vessel. After dissecting the vessels free #10 shunt was prepared and the patient was heparinized. Carotid vessels were occluded with vascular clamps a longitudinal opening made in the common carotid with a 15 blade stent at interval carotid with Potts scissors to a point distal to the disease. The plaque was about 90% stenotic in severity but fairly smooth and calcified. #10 shunt was inserted without difficulty reestablishing flow in about 2 minutes. Standard endarterectomy was then performed using the elevator and Potts scissors with an eversion endarterectomy of the external carotid. Plaque feathered off the distal internal  carotid artery nice and not requiring any tacking sutures. Ligament was thoroughly irrigated with heparin saline all loose debris carefully removed. It was decided to resect about 3 cm length of the common carotid to relieve the redundancy in the kink. Tacking sutures of 6-0 Prolene were placed proximally and distally and a 3 cm segment was excised with the artery being reanastomosed with 6-0 Prolene. Following completion of this a Dacron patch was sewn into place. Prior to completion of the closure the shunt was removed after about 50 minutes of time following antegrade and retrograde flushing closure was completed the reestablishing flow initially of external the internal branch. Carotid is occluded for less than 2 minutes for removal of the shunt. Protamine was then given to reverse the heparin following adequate hemostasis when closed in layers with Vicryl in a subcuticular fashion sterile dressing applied patient taken to the recovery room satisfactory condition Procedure Details:   [name], MD 09/21/2012 10:51 AM

## 2012-09-22 ENCOUNTER — Encounter (HOSPITAL_COMMUNITY): Payer: Self-pay | Admitting: Vascular Surgery

## 2012-09-22 LAB — GLUCOSE, CAPILLARY
Glucose-Capillary: 146 mg/dL — ABNORMAL HIGH (ref 70–99)
Glucose-Capillary: 168 mg/dL — ABNORMAL HIGH (ref 70–99)
Glucose-Capillary: 120 mg/dL — ABNORMAL HIGH (ref 70–99)

## 2012-09-22 LAB — BASIC METABOLIC PANEL
BUN: 3 mg/dL — ABNORMAL LOW (ref 6–23)
CO2: 31 mEq/L (ref 19–32)
Calcium: 7.9 mg/dL — ABNORMAL LOW (ref 8.4–10.5)
Creatinine, Ser: 0.5 mg/dL (ref 0.50–1.10)
Glucose, Bld: 144 mg/dL — ABNORMAL HIGH (ref 70–99)

## 2012-09-22 LAB — CBC
HCT: 34.1 % — ABNORMAL LOW (ref 36.0–46.0)
MCH: 26.8 pg (ref 26.0–34.0)
MCV: 83.8 fL (ref 78.0–100.0)
Platelets: 353 10*3/uL (ref 150–400)
RBC: 4.07 MIL/uL (ref 3.87–5.11)

## 2012-09-22 NOTE — Evaluation (Signed)
Physical Therapy Evaluation Patient Details Name: Sherri Hays MRN: [id number] DOB: 22-Sep-1951 Today's Date: 09/22/2012 Time: 1431-1500 PT Time Calculation (min): 29 min  PT Assessment / Plan / Recommendation Clinical Impression  Pt s/p right CEA with limited evaluation today due to patient extremely lethargic.  Nursing calling MD to address poor sats at rest.  Will try and finish evaluation when patient more alert and awake.  Continue PT as able.    PT Assessment  Patient needs continued PT services    Follow Up Recommendations   (difficult to assess secondary to limited evaluation)    Does the patient have the potential to tolerate intense rehabilitation      Barriers to Discharge        Equipment Recommendations   (TBA when evaluation completed)    Recommendations for Other Services     Frequency Min 3X/week    Precautions / Restrictions Precautions Precautions: Fall Restrictions Weight Bearing Restrictions: No   Pertinent Vitals/Pain Upon arrival, patient's O2 sats in the 60's.  Notified nursing who placed O2 on at 6L.  O2 sat on departure at 93% with nursing in room with 6L O2.  No pain      Mobility  Bed Mobility Bed Mobility: Scooting to HOB Scooting to HOB: 1: +2 Total assist Scooting to [facility] 2: Patient Percentage: 0% Details for Bed Mobility Assistance: Pt was slid down in bed slightly.  Called for help and assisted pt up in bed.  Raised bed to 30 degree sitting position.  Pt. was difficult to arouse upon arrival and checked sats and her sats were 60%.  Called nursing who came and placed 6 L O2 on patient.  O2 sat within 5 minutes was >93%.  Nursing calling MD as patient was slighltly responsive with O2 placed but did no more than grunt occasionally.  Left patient with nurse on departure.  Unable to fully assess patient obviously.   Transfers Transfers: Not assessed Ambulation/Gait Ambulation/Gait Assistance: Not tested (comment) Stairs: No Wheelchair  Mobility Wheelchair Mobility: No              PT Diagnosis: Generalized weakness;Altered mental status  PT Problem List: Decreased activity tolerance;Decreased mobility;Cardiopulmonary status limiting activity PT Treatment Interventions: DME instruction;Gait training;Functional mobility training;Therapeutic activities;Therapeutic exercise;Patient/family education   PT Goals Acute Rehab PT Goals PT Goal Formulation: With patient Time For Goal Achievement: 09/29/12 Potential to Achieve Goals: Fair Pt will go Supine/Side to Sit: with min assist;with rail PT Goal: Supine/Side to Sit - Progress: Goal set today Pt will Transfer Bed to Chair/Chair to Bed: with min assist PT Transfer Goal: Bed to Chair/Chair to Bed - Progress: Goal set today Pt will Perform Home Exercise Program: with supervision, verbal cues required/provided PT Goal: Perform Home Exercise Program - Progress: Goal set today  Visit Information  Last PT Received On: 09/22/12 Assistance Needed: +2    Subjective Data  Subjective: Few verbalizations.  On arrival, patient with agonal breathing.  Difficult to arouse but would open eyes and say "What" but would go right back to sleep. Patient Stated Goal: Unable to assess.   Prior Functioning  Home Living Lives With:  (per chart, lives with boyfriend) Home Adaptive Equipment:  (per chart, uses RW) Additional Comments: Pt too lethargic to ask questions    Cognition  Overall Cognitive Status: Difficult to assess Difficult to assess due to: Level of arousal Arousal/Alertness: Lethargic Behavior During Session: Lethargic    Extremity/Trunk Assessment Right Upper Extremity Assessment RUE ROM/Strength/Tone: Unable to fully  assess Left Upper Extremity Assessment LUE ROM/Strength/Tone: Unable to fully assess Right Lower Extremity Assessment RLE ROM/Strength/Tone: Unable to fully assess Left Lower Extremity Assessment LLE ROM/Strength/Tone: Unable to fully assess LLE  Coordination:  (pt moved her left LE to command to move on bed)   Balance Balance Balance Assessed: No  End of Session PT - End of Session Equipment Utilized During Treatment: Oxygen (6L on departure with nursing in patient room) Activity Tolerance: Treatment limited secondary to medical complications (Comment) (lethargic and poor sats.) Patient left: in bed;with call bell/phone within reach;with nursing in room Nurse Communication: Mobility status (poor responsiveness and desaturation)       INGOLD,Katharyn Schauer 09/22/2012, 3:20 PM  [facility] Acute [facility] [phone] (pager)

## 2012-09-22 NOTE — Progress Notes (Signed)
Report called to sherry rn. Pt going to 2033 via w/c with belongings. Only required one assst to transfer into bed. Sherri Hays

## 2012-09-22 NOTE — Progress Notes (Signed)
Pt refusing to take night time medications but agreed to take hydralazine. Tried to give with applesauce but patient would not swallow pill. MD notified and gave order to hold all night time medications. Will continue to monitor.

## 2012-09-22 NOTE — Progress Notes (Signed)
VASCULAR PROGRESS NOTE  SUBJECTIVE: no specific complaints. Pain adequately controlled.  PHYSICAL EXAM: Filed Vitals:   09/22/12 0355 09/22/12 0645 09/22/12 0739 09/22/12 0940  BP: 156/66  150/74 145/67  Pulse: 107  101 111  Temp: 98.6 F (37 C)  98.8 F (37.1 C) 98 F (36.7 C)  TempSrc: Oral  Oral Oral  Resp: 19  19 22   Height:      Weight:      SpO2: 99% 98% 99% 90%   Neck incision looks good. No focal neurologic findings.  LABS: Lab Results  Component Value Date   WBC 19.0* 09/22/2012   HGB 10.9* 09/22/2012   HCT 34.1* 09/22/2012   MCV 83.8 09/22/2012   PLT 353 09/22/2012   Lab Results  Component Value Date   CREATININE 0.50 09/22/2012   Lab Results  Component Value Date   INR 1.03 09/20/2012   CBG (last 3)   Basename 09/22/12 0409 09/21/12 2337 09/21/12 2232  GLUCAP 146* 168* 117*     ASSESSMENT/PLAN: 1. 1 Day Post-Op s/p: right carotid endarterectomy. 2. Will likely need physical therapy. Also her boyfriend does not drive so we'll have to arrange for her to get a ride home and she is ready for discharge. 3. She is being transferred to 2000.  [name] Beeper: [phone] 09/22/2012

## 2012-09-22 NOTE — Progress Notes (Signed)
Pt BS 102, and still drowsy and not eating. MD called and gave order to hold lantus tonight and trazodone tonight only, will continue to monitor.

## 2012-09-22 NOTE — Progress Notes (Signed)
Physical medicine rehabilitation consult has been requested in chart reviewed. Patient is status post right carotid surgery today and is just been admitted to a room. Noted history of recent stroke in September. Patient at this time is incapacitated could not ask answer any questions. Will need physical and occupational therapy evaluations to be completed and at that time we'll followup with formal rehabilitation consult

## 2012-09-23 LAB — GLUCOSE, CAPILLARY
Glucose-Capillary: 113 mg/dL — ABNORMAL HIGH (ref 70–99)
Glucose-Capillary: 145 mg/dL — ABNORMAL HIGH (ref 70–99)
Glucose-Capillary: 178 mg/dL — ABNORMAL HIGH (ref 70–99)
Glucose-Capillary: 156 mg/dL — ABNORMAL HIGH (ref 70–99)

## 2012-09-23 MED ORDER — INSULIN ASPART 100 UNIT/ML ~~LOC~~ SOLN
0.0000 [IU] | Freq: Three times a day (TID) | SUBCUTANEOUS | Status: DC
  Administered 2012-09-23 – 2012-09-24 (×2): 3 [IU] via SUBCUTANEOUS
  Administered 2012-09-25: 4 [IU] via SUBCUTANEOUS
  Administered 2012-09-25: 3 [IU] via SUBCUTANEOUS
  Administered 2012-09-25: 4 [IU] via SUBCUTANEOUS

## 2012-09-23 NOTE — Plan of Care (Signed)
Problem: Consults Goal: Diagnosis CEA/CES/AAA Stent Outcome: Completed/Met Date Met:  09/22/12 Carotid Endarterectomy (CEA)

## 2012-09-23 NOTE — Progress Notes (Addendum)
VASCULAR AND VEIN SURGERY POST - OP CEA PROGRESS NOTE  Date of Surgery: 09/21/2012  Surgeon(s): Sherri Ochoa, MD 2 Days Post-Op right Carotid Endarterectomy .  HPI: Sherri Hays is a 61 y.o. female who is 2 Days Post-Op right Carotid Endarterectomy . Patient is doing well. Pre-operative symptoms are Unchanged Patient denies headache; Patient reports some difficulty swallowing; reports weakness in Right upper and lower extremities; Pt. denies other symptoms of stroke or TIA. Pt lives at home with boyfriend - transfers bed to chair- limited walking Much more alert in am   IMAGING: Dg Chest Port 1 View  09/21/2012  *RADIOLOGY REPORT*  Clinical Data: Central line placement, postop surgery on the carotid arteries  PORTABLE CHEST - 1 VIEW  Comparison: Chest x-ray of 07/06/2012  Findings: A left IJ central venous line is now present with the tip at the junction of the left innominate vein and SVC.  No pneumothorax is seen.  Minimal basilar atelectasis is noted. Cardiomegaly is stable.  IMPRESSION:   A left IJ central venous line tip is at the junction of left innominate vein and SVC.  No pneumothorax.   Original Report Authenticated By: Sherri Hays, M.D.     Significant Diagnostic Studies: CBC Lab Results  Component Value Date   WBC 19.0* 09/22/2012   HGB 10.9* 09/22/2012   HCT 34.1* 09/22/2012   MCV 83.8 09/22/2012   PLT 353 09/22/2012    BMET    Component Value Date/Time   NA 139 09/22/2012 0420   K 3.1* 09/22/2012 0420   CL 100 09/22/2012 0420   CO2 31 09/22/2012 0420   GLUCOSE 144* 09/22/2012 0420   BUN 3* 09/22/2012 0420   CREATININE 0.50 09/22/2012 0420   CALCIUM 7.9* 09/22/2012 0420   GFRNONAA >90 09/22/2012 0420   GFRAA >90 09/22/2012 0420    COAG Lab Results  Component Value Date   INR 1.03 09/20/2012   INR 0.91 05/28/2012   INR 0.94 01/23/2012   No results found for this basename: PTT     No intake or output data in the 24 hours ending 09/23/12  0844  Physical Exam:  BP Readings from Last 3 Encounters:  09/23/12 149/78  09/23/12 149/78  09/20/12 126/78   Temp Readings from Last 3 Encounters:  09/23/12 99 F (37.2 C) Oral  09/23/12 99 F (37.2 C) Oral  09/20/12 98.6 F (37 C)    SpO2 Readings from Last 3 Encounters:  09/23/12 92%  09/23/12 92%  09/20/12 97%   Pulse Readings from Last 3 Encounters:  09/23/12 103  09/23/12 103  09/20/12 98    Pt is A&O x 3 Speech is slightly garbled but understandable right Neck Wound is healing well Patient with Negative tongue deviation and Negative facial droop Pt has 3/5 strength RUE/ RLE and 5/5 strength in LUE/LLE  Assessment/Plan:: Sherri Hays is a 61 y.o. female is S/P Right Carotid endarterectomy Hx left brain stroke with residual RUE/RLE weakness and expressive aphasia Also some dysphagia - will have SLP evaluate Pt ate well this am with some choking on liquids food. SLP for dysphagia eval and rec. PT/OT eval ? If would be able to do CIR  Sherri Hays [phone] 09/23/2012 8:44 AM  Much more alert this afternoon. Neuro baseline. Will change CBG's to [facility] and HS. Physical therapies help appreciated. It sounds like she does need continued physical therapy. Swallowing evaluation is pending.  Sherri Hays Beeper [phone] 09/23/2012

## 2012-09-24 LAB — TYPE AND SCREEN
ABO/RH(D): B POS
Antibody Screen: NEGATIVE
Unit division: 0

## 2012-09-24 LAB — GLUCOSE, CAPILLARY: Glucose-Capillary: 148 mg/dL — ABNORMAL HIGH (ref 70–99)

## 2012-09-24 MED ORDER — RESOURCE THICKENUP CLEAR PO POWD
ORAL | Status: DC | PRN
  Filled 2012-09-24: qty 125

## 2012-09-24 MED ORDER — CHLORHEXIDINE GLUCONATE 0.12 % MT SOLN
15.0000 mL | Freq: Two times a day (BID) | OROMUCOSAL | Status: DC
  Administered 2012-09-24 – 2012-09-26 (×4): 15 mL via OROMUCOSAL
  Filled 2012-09-24 (×6): qty 15

## 2012-09-24 MED ORDER — BIOTENE DRY MOUTH MT LIQD
15.0000 mL | Freq: Two times a day (BID) | OROMUCOSAL | Status: DC
  Administered 2012-09-24 – 2012-09-26 (×4): 15 mL via OROMUCOSAL

## 2012-09-24 NOTE — Evaluation (Signed)
Clinical/Bedside Swallow Evaluation Patient Details  Name: Sherri Hays MRN: [id number] Date of Birth: [birth year]  Today's Date: 09/24/2012 Time: 0930-1001 SLP Time Calculation (min): 31 min  Past Medical History:  Past Medical History  Diagnosis Date  . Dysphagia   . TIA (transient ischemic attack)   . Genital herpes   . DM neuropathies   . Anemia   . Arthritis   . Asthma   . Cancer     lt breast  . Schizophrenia, simple   . Diabetes mellitus   . Pneumonia     hx of  . Sleep apnea     "wears cpap, unsure of settings"  . Stroke     2011  . Neuromuscular disorder     diabetic neuropathy  . COPD (chronic obstructive pulmonary disease)   . Hypertension     physician home visits, Dr. Redmond School [phone]   Past Surgical History:  Past Surgical History  Procedure Date  . Cholecystectomy   . Cesarean section   . Tonsillectomy   . Endarterectomy 09/21/2012    Procedure: ENDARTERECTOMY CAROTID;  Surgeon: Pryor Ochoa, MD;  Location: [facility] OR;  Service: Vascular;  Laterality: Right;  . Patch angioplasty 09/21/2012    Procedure: PATCH ANGIOPLASTY;  Surgeon: Pryor Ochoa, MD;  Location: [facility] OR;  Service: Vascular;  Laterality: Right;  with dacron patch angioplasty   HPI:  61 y/o female s/p Right carotid endarterectomy.   Patient referred for BSE due to reports of difficulty swallowing liquids.    Assessment / Plan / Recommendation Clinical Impression  Dysphagia indicated by reduced hyoid laryngeal elevation with swallows in succession. Initiation difficulty to assess by palpation due to excess adipose tissue.  Wet vocal quality noted s/p swallow of  thin liquid trials with coughing moderately after swallow. Moderate amount of residue cleared from posterior portion of tongue with regular solid trials with wet coughing s/p oral care.   Question residuals of liquid and solids s/p swallow due to s/s present.  Recommend downgrade to dysphagia 2 ( finely chopped)  and nectar thick  liquids. Due to history of CVA, s/s present during swallow evaluation and current respiratory status recommend objective evaluation of MBS to assess risk for aspiration.  Recommend MBS to be completed on 09/25/12.  ST to follow closely in acute care setting.     Aspiration Risk  Moderate    Diet Recommendation Dysphagia 2 (Fine chop);Nectar-thick liquid   Liquid Administration via: Cup;No straw Medication Administration: Whole meds with puree Supervision: Full supervision/cueing for compensatory strategies Compensations: Slow rate;Small sips/bites;Effortful swallow;Multiple dry swallows after each bite/sip;Check for pocketing;Clear throat intermittently;Follow solids with liquid Postural Changes and/or Swallow Maneuvers: Out of bed for meals;Upright 30-60 min after meal    Other  Recommendations Recommended Consults: MBS Oral Care Recommendations: Oral care QID Other Recommendations: Order thickener from pharmacy;Clarify dietary restrictions;Prohibited food (jello, ice cream, thin soups);Remove water pitcher;Have oral suction available   Follow Up Recommendations  Skilled Nursing facility;24 hour supervision/assistance    Frequency and Duration min 2x/week  2 weeks       SLP Swallow Goals Patient will consume recommended diet without observed clinical signs of aspiration with: Minimal assistance Patient will utilize recommended strategies during swallow to increase swallowing safety with: Minimal assistance   Swallow Study Prior Functional Status   Lives at home with significant other     General Date of Onset: 09/22/12 HPI: 61 y/o female s/p Right carotid endarterectomy Type of Study: Bedside swallow evaluation Previous Swallow  Assessment: BSE 07/06/12 Diet Prior to this Study: Regular;Thin liquids Temperature Spikes Noted: No Respiratory Status: Supplemental O2 delivered via (comment) (nasal cannula) History of Recent Intubation: Yes Length of Intubations (days):   (surgery) Behavior/Cognition: Alert;Cooperative;Pleasant mood;Decreased sustained attention;Distractible;Requires cueing Oral Cavity - Dentition: Dentures, not available;Edentulous Self-Feeding Abilities: Total assist Patient Positioning: Upright in bed Baseline Vocal Quality: Hoarse Volitional Cough: Strong Volitional Swallow: Able to elicit    Oral/Motor/Sensory Function Overall Oral Motor/Sensory Function: Impaired at baseline Labial ROM: Reduced right Labial Symmetry: Abnormal symmetry right Labial Strength: Within Functional Limits Labial Sensation: Within Functional Limits Lingual ROM: Reduced right Lingual Symmetry: Abnormal symmetry right Lingual Strength: Reduced Lingual Sensation: Reduced Facial ROM: Within Functional Limits Facial Symmetry: Within Functional Limits Facial Strength: Within Functional Limits Facial Sensation: Reduced Velum: Impaired right Mandible: Within Functional Limits   Ice Chips Ice chips: Not tested   Thin Liquid Thin Liquid: Impaired Presentation: Cup;Straw Pharyngeal  Phase Impairments: Cough - Delayed;Wet Vocal Quality    Nectar Thick Nectar Thick Liquid: Impaired Presentation: Cup Pharyngeal Phase Impairments: Decreased hyoid-laryngeal movement   Honey Thick Honey Thick Liquid: Not tested   Puree Puree: Impaired Pharyngeal Phase Impairments: Decreased hyoid-laryngeal movement;Cough - Delayed   Solid   GO     [name] MS, CCC-SLP [phone] Solid: Impaired Oral Phase Impairments: Poor awareness of bolus;Reduced lingual movement/coordination Oral Phase Functional Implications: Oral residue Pharyngeal Phase Impairments: Decreased hyoid-laryngeal movement;Cough - Delayed       [name] 09/24/2012,10:23 AM

## 2012-09-24 NOTE — Progress Notes (Addendum)
VASCULAR AND VEIN SURGERY POST - OP CEA PROGRESS NOTE  Date of Surgery: 09/21/2012  Surgeon(s): Pryor Ochoa, MD 3 Days Post-Op right Carotid Endarterectomy .  HPI: Sherri Hays is a 61 y.o. female who is 3 Days Post-Op right Carotid Endarterectomy . Patient is doing well. Pre-operative symptoms are Unchanged Patient denies headache; Much more alert today Patient reports difficulty swallowing liquids but handling carb mod diet   Intake/Output Summary (Last 24 hours) at 09/24/12 0850 Last data filed at 09/23/12 1100  Gross per 24 hour  Intake    120 ml  Output      0 ml  Net    120 ml    Physical Exam:  BP Readings from Last 3 Encounters:  09/24/12 148/88  09/24/12 148/88  09/20/12 126/78   Temp Readings from Last 3 Encounters:  09/24/12 97.7 F (36.5 C) Oral  09/24/12 97.7 F (36.5 C) Oral  09/20/12 98.6 F (37 C)    SpO2 Readings from Last 3 Encounters:  09/24/12 92%  09/24/12 92%  09/20/12 97%   Pulse Readings from Last 3 Encounters:  09/24/12 79  09/24/12 79  09/20/12 98    Pt is A&O x 3; more awake , feels good, feeding herself today Speech is garbled as pre-op right Neck Wound is healing well Patient with Negative tongue deviation and Negative facial droop Pt has good strength on left; 3/5 on right - unchanged  Assessment/Plan:: Sherri Hays is a 61 y.o. female is S/P Right Carotid endarterectomy Pt with previous left brain stroke with residual garbled speech which is appropriate and understandable RUE/RLE weakness unchanged- transferring bed to commode and chair Feeding self today Overall improved. Social work evaluating DC plan Thickened liquids  Della Goo, [place]  D/C Left IJ line Hopefully home tomorrow if we can arrange a ride.  [name] Beeper [phone] 09/24/2012

## 2012-09-25 ENCOUNTER — Other Ambulatory Visit (HOSPITAL_COMMUNITY): Payer: Self-pay

## 2012-09-25 ENCOUNTER — Inpatient Hospital Stay (HOSPITAL_COMMUNITY): Payer: Medicaid Other

## 2012-09-25 DIAGNOSIS — I6330 Cerebral infarction due to thrombosis of unspecified cerebral artery: Secondary | ICD-10-CM

## 2012-09-25 DIAGNOSIS — G92 Toxic encephalopathy: Secondary | ICD-10-CM

## 2012-09-25 DIAGNOSIS — R5381 Other malaise: Secondary | ICD-10-CM

## 2012-09-25 LAB — GLUCOSE, CAPILLARY
Glucose-Capillary: 152 mg/dL — ABNORMAL HIGH (ref 70–99)
Glucose-Capillary: 126 mg/dL — ABNORMAL HIGH (ref 70–99)
Glucose-Capillary: 107 mg/dL — ABNORMAL HIGH (ref 70–99)

## 2012-09-25 NOTE — Plan of Care (Signed)
Problem: Phase III Progression Outcomes Goal: Advance to regular diet without nausea Outcome: Not Applicable Date Met:  09/25/12 Dysphagia diet due to swallowing issues. Had barium swallow today.

## 2012-09-25 NOTE — Progress Notes (Addendum)
Physical Therapy Treatment Patient Details Name: Sherri Hays MRN: [id number] DOB: Feb 04, 1951 Today's Date: 09/25/2012 Time: 4098-1191 PT Time Calculation (min): 24 min  PT Assessment / Plan / Recommendation Comments on Treatment Session  Patient s/p RCEA with decr mobility secondary to right hemibody weakness (worsened since surgery per pt) as well as decr endurance.  Pt requiring +2 total assist (pt = 30%).  Concerned whether pt is going to be able to go home with boyfriend unless he can physically lift her and provide 24 hour care.  Recommend SNF as ultimate d/c plan so patient can achieve prior functional status as she is appears  weaker than her baseline.        Follow Up Recommendations  Supervision/Assistance - 24 hour;SNF     Does the patient have the potential to tolerate intense rehabilitation     Barriers to Discharge        Equipment Recommendations  None recommended by PT    Recommendations for Other Services    Frequency Min 3X/week   Plan Discharge plan remains appropriate;Frequency remains appropriate    Precautions / Restrictions Precautions Precautions: Fall Restrictions Weight Bearing Restrictions: No   Pertinent Vitals/Pain VSS, some pain    Mobility  Bed Mobility Bed Mobility: Rolling Right;Right Sidelying to Sit;Supine to Sit;Sit to Supine Rolling Right: 3: Mod assist;With rail Right Sidelying to Sit: 3: Mod assist;With rails;HOB elevated Supine to Sit: 3: Mod assist;HOB elevated;With rails Sit to Supine: 3: Mod assist Scooting to [facility]: Not tested (comment) Details for Bed Mobility Assistance: Pt was in bed on arrival.  Pt did not want to get up initially but persuaded patient.  Pt needed a lot of assistance to get to EOB.  Once at EOB, patient able to sit with min guard assist for a few minutes.  Pt stated she needed to use BS commode. Transfers Transfers: Sit to Stand;Stand to Sit;Stand Pivot Transfers Sit to Stand: 1: +2 Total assist;With upper  extremity assist;From bed Sit to Stand: Patient Percentage: 30% Stand to Sit: 1: +2 Total assist;With upper extremity assist;With armrests;To chair/3-in-1 Stand to Sit: Patient Percentage: 30% Stand Pivot Transfers: 1: +2 Total assist Stand Pivot Transfers: Patient Percentage: 30% Details for Transfer Assistance: Pt needed cues for hand placement with sit to stand and stand to sit.  Pt made several attempts to stand up from bed needing lots of cues to achieve technique and achieve position of trunk and LEs.  Pt stood first time and panicked and sat abruptly back onto bed needing assist at LEs to ensure that she got onto bed appropriately.  Pt states she said she sat quickly due to having to urinate.  Got BS commode and placed to patient's left.  Pt transferred to left to 3N1 with postural stability by PT and SPT to maintain balance, assist with weight shifting and for trunk, hip and knee stabililty.  Had to wipe pt after -pt urinated.  Assisted back to bed with same assist.     Ambulation/Gait Ambulation/Gait Assistance: Not tested (comment) Stairs: No Wheelchair Mobility Wheelchair Mobility: No    PT Goals Acute Rehab PT Goals PT Goal: Supine/Side to Sit - Progress: Progressing toward goal PT Transfer Goal: Bed to Chair/Chair to Bed - Progress: Progressing toward goal  Visit Information  Last PT Received On: 09/25/12 Assistance Needed: +2    Subjective Data  Subjective: Pt more alert today once aroused.  Difficult to understand at times but much more conversant than prior session.   Patient  Stated Goal: To get more therapy and then go home.   Cognition  Overall Cognitive Status: History of cognitive impairments - at baseline Area of Impairment: Attention;Safety/judgement;Problem solving Arousal/Alertness: Awake/alert Orientation Level: Appears intact for tasks assessed Behavior During Session: [facility] for tasks performed Current Attention Level: Focused Safety/Judgement: Decreased  awareness of safety precautions;Decreased safety judgement for tasks assessed Awareness of Errors: Assistance required to identify errors made;Assistance required to correct errors made    Balance  Static Sitting Balance Static Sitting - Balance Support: Bilateral upper extremity supported;Feet supported Static Sitting - Level of Assistance: 5: Stand by assistance Static Sitting - Comment/# of Minutes: 2 minutes - initially needed min assist until patient's feet on the floor good  End of Session PT - End of Session Equipment Utilized During Treatment: Gait belt;Oxygen (4L) Activity Tolerance: Patient limited by fatigue (refused to get into chair) Patient left: in bed;with call bell/phone within reach Nurse Communication: Mobility status       INGOLD,Madiha Bambrick 09/25/2012, 1:05 PM  [facility] [phone] (pager)

## 2012-09-25 NOTE — Progress Notes (Addendum)
VASCULAR AND VEIN SURGERY POST - OP CEA PROGRESS NOTE  Date of Surgery: 09/21/2012  Surgeon(s): Pryor Ochoa, MD 4 Days Post-Op Right Carotid Endarterectomy .  HPI: Sherri Hays is a 61 y.o. female who is 4 Days Post-Op Right Carotid Endarterectomy . Patient is doing well. Pre-operative symptoms are Unchanged Patient denies headache; Patient reports difficulty swallowing; states this is slightly worse than pre-op reports weakness in right upper and lower extremities;   IMAGING: No results found.    Intake/Output Summary (Last 24 hours) at 09/25/12 1610 Last data filed at 09/24/12 1200  Gross per 24 hour  Intake    240 ml  Output      0 ml  Net    240 ml    Physical Exam:  BP Readings from Last 3 Encounters:  09/25/12 157/72  09/25/12 157/72  09/20/12 126/78   Temp Readings from Last 3 Encounters:  09/25/12 98.3 F (36.8 C) Oral  09/25/12 98.3 F (36.8 C) Oral  09/20/12 98.6 F (37 C)    SpO2 Readings from Last 3 Encounters:  09/25/12 98%  09/25/12 98%  09/20/12 97%   Pulse Readings from Last 3 Encounters:  09/25/12 76  09/25/12 76  09/20/12 98    Pt is A&O x 3  Speech is garbled but understandable - unchanged right Neck Wound is healing well Neuro exam unchanged  Assessment/Plan:: Sherri Hays is a 61 y.o. 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  For MBSS today and hopefully discharge  soon Believe patient would benefit from CIR

## 2012-09-25 NOTE — Evaluation (Signed)
Physical Therapy  Patient Details Name: LIVELY BEHAR MRN: [id number] DOB: 09-26-1951 Today's Date: 09/25/2012     Prior Functioning  Home Living Lives With:  (boyfriend) Available Help at Discharge: Available PRN/intermittently;Friend(s) Type of Home: House Home Access: Ramped entrance Home Layout: One level Home Adaptive Equipment: Walker - rolling;Wheelchair - manual;Bedside commode/3-in-1 Prior Function Level of Independence: Needs assistance Needs Assistance: Transfers Transfer Assistance: min asssist per pt prior to admit Able to Take Stairs?: No Driving: No Communication Communication: No difficulties   Per patient, this was her prior level of function information.  Unable to get information the day of evaluation.                      INGOLD,Gissella Niblack 09/25/2012, 1:07 PM  [facility] [phone] (pager)

## 2012-09-25 NOTE — Procedures (Signed)
Objective Swallowing Evaluation: Modified Barium Swallowing Study  Patient Details  Name: Sherri Hays MRN: [id number] Date of Birth: Dec 28, 1950  Today's Date: 09/25/2012 Time: 1040-1105 SLP Time Calculation (min): 25 min  Past Medical History:  Past Medical History  Diagnosis Date  . Dysphagia   . TIA (transient ischemic attack)   . Genital herpes   . DM neuropathies   . Anemia   . Arthritis   . Asthma   . Cancer     lt breast  . Schizophrenia, simple   . Diabetes mellitus   . Pneumonia     hx of  . Sleep apnea     "wears cpap, unsure of settings"  . Stroke     2011  . Neuromuscular disorder     diabetic neuropathy  . COPD (chronic obstructive pulmonary disease)   . Hypertension     physician home visits, Dr. Redmond School [phone]   Past Surgical History:  Past Surgical History  Procedure Date  . Cholecystectomy   . Cesarean section   . Tonsillectomy   . Endarterectomy 09/21/2012    Procedure: ENDARTERECTOMY CAROTID;  Surgeon: Pryor Ochoa, MD;  Location: [facility] OR;  Service: Vascular;  Laterality: Right;  . Patch angioplasty 09/21/2012    Procedure: PATCH ANGIOPLASTY;  Surgeon: Pryor Ochoa, MD;  Location: [facility] OR;  Service: Vascular;  Laterality: Right;  with dacron patch angioplasty   HPI:  61 y/o female s/p Right carotid endarterectomy. Pt has a history of left CVA approx 3 weeks ago with right sided weakness. Pt reports some coughing episodes.      Assessment / Plan / Recommendation Clinical Impression  Clinical impression: Pt presents with mild difficutly manipulating solids due to missing dentition but not weakness. Pharyngeal phase characterized by decreased closure of the laryngeal vestibule with deep flash penetration on most trials. There was no frank penetration/aspiration during the study. With large sips the pt is at risk for aspiration before the swallow due to a slight delay in intiation, increased with bolus size. There is no residual and no apparent  impact of right cartoid endarterectory on swallow function. Suspect pts function is baseline from prior CVA. Pt is safe to consume a dys 2 (fine chopped diet) her preference and thin liquids with small sips, no straws. SLP will f/u x1 to reinforce precautions.     Treatment Recommendation  Therapy as outlined in treatment plan below    Diet Recommendation Dysphagia 2 (Fine chop);Thin liquid   Liquid Administration via: Cup;No straw Medication Administration: Whole meds with puree Supervision: Full supervision/cueing for compensatory strategies Compensations: Slow rate;Small sips/bites Postural Changes and/or Swallow Maneuvers: Seated upright 90 degrees;Upright 30-60 min after meal    Other  Recommendations Oral Care Recommendations: Oral care BID   Follow Up Recommendations  None    Frequency and Duration min 2x/week  1 week   Pertinent Vitals/Pain None    SLP Swallow Goals Patient will consume recommended diet without observed clinical signs of aspiration with: Supervision/safety Patient will utilize recommended strategies during swallow to increase swallowing safety with: Minimal cueing   General HPI: 61 y/o female s/p Right carotid endarterectomy. Pt has a history of left CVA approx 3 weeks ago with right sided weakness. Pt reports some coughing episodes.  Type of Study: Modified Barium Swallowing Study Reason for Referral: Objectively evaluate swallowing function Previous Swallow Assessment: BSE 07/06/12 Diet Prior to this Study: Dysphagia 2 (chopped);Nectar-thick liquids Temperature Spikes Noted: No Respiratory Status: Supplemental O2 delivered via (comment)  History of Recent Intubation: Yes Length of Intubations (days): 1 days Behavior/Cognition: Alert;Cooperative;Pleasant mood;Decreased sustained attention;Distractible;Requires cueing Oral Cavity - Dentition: Dentures, not available;Edentulous Oral Motor / Sensory Function: Impaired - see Bedside swallow eval Self-Feeding  Abilities: Able to feed self Patient Positioning: Upright in chair Baseline Vocal Quality: Hoarse Volitional Cough: Strong Volitional Swallow: Able to elicit Anatomy: Within functional limits Pharyngeal Secretions: Not observed secondary MBS    Reason for Referral Objectively evaluate swallowing function   Oral Phase Oral Preparation/Oral Phase Oral Phase: WFL   Pharyngeal Phase Pharyngeal Phase Pharyngeal Phase: Impaired Pharyngeal - Thin Pharyngeal - Thin Cup: Penetration/Aspiration during swallow;Reduced airway/laryngeal closure Penetration/Aspiration details (thin cup): Material enters airway, remains ABOVE vocal cords then ejected out Pharyngeal - Thin Straw: Penetration/Aspiration during swallow;Reduced airway/laryngeal closure Penetration/Aspiration details (thin straw): Material enters airway, remains ABOVE vocal cords then ejected out Pharyngeal - Solids Pharyngeal - Puree: Within functional limits Pharyngeal - Mechanical Soft: Within functional limits Pharyngeal - Pill: Within functional limits  Cervical Esophageal Phase    GO             Harlon Ditty, MA CCC-SLP [phone]  Claudine Mouton 09/25/2012, 1:30 PM

## 2012-09-25 NOTE — Consult Note (Signed)
Physical Medicine and Rehabilitation Consult Reason for Consult: Right carotid endarterectomy/recent CVA Referring Physician: Dr. Hart Rochester   HPI: Sherri Hays is a 61 y.o. 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 Patient lives with her boyfriend used a rolling walker prior to admission. Admitted 09/21/2012 and underwent right carotid enterectomy 09/21/2012 per Dr. Hart Rochester. Patient remains on aspirin and Plavix therapy for history of CVA. Postoperative elevated white blood cell count of 19,000 with chest x-ray 1121 showing minimal basilar atelectasis noted. Followup speech therapy for history of dysphagia presently on a dysphagia 2 nectar thick liquid diet. Hospital course with noted lethargy as well as patient refusing to take some of her medications. Physical therapy evaluation attempt 09/22/12 with assessment Limited again secondary to lethargy. Full therapy assessments to follow. M.D. is requested physical medicine rehabilitation consult to consider inpatient rehabilitation services   Review of Systems  Unable to perform ROS  Past Medical History  Diagnosis Date  . Dysphagia   . TIA (transient ischemic attack)   . Genital herpes   . DM neuropathies   . Anemia   . Arthritis   . Asthma   . Cancer     lt breast  . Schizophrenia, simple   . Diabetes mellitus   . Pneumonia     hx of  . Sleep apnea     "wears cpap, unsure of settings"  . Stroke     2011  . Neuromuscular disorder     diabetic neuropathy  . COPD (chronic obstructive pulmonary disease)   . Hypertension     physician home visits, Dr. Redmond School [phone]   Past Surgical History  Procedure Date  . Cholecystectomy   . Cesarean section   . Tonsillectomy   . Endarterectomy 09/21/2012    Procedure: ENDARTERECTOMY CAROTID;  Surgeon: Pryor Ochoa, MD;   Location: [facility] OR;  Service: Vascular;  Laterality: Right;  . Patch angioplasty 09/21/2012    Procedure: PATCH ANGIOPLASTY;  Surgeon: Pryor Ochoa, MD;  Location: [facility] OR;  Service: Vascular;  Laterality: Right;  with dacron patch angioplasty   History reviewed. No pertinent family history. Social History:  reports that she has been smoking Cigarettes.  She has been smoking about .5 packs per day. She has never used smokeless tobacco. She reports that she does not drink alcohol or use illicit drugs. Allergies:  Allergies  Allergen Reactions  . Penicillins Other (See Comments)    unknown   Medications Prior to Admission  Medication Sig Dispense Refill  . acetaminophen (TYLENOL) 325 MG tablet Take 2 tablets (650 mg total) by mouth every 6 (six) hours as needed for pain or fever (or Fever >/= 101).      Marland Kitchen albuterol (PROVENTIL HFA;VENTOLIN HFA) 108 (90 BASE) MCG/ACT inhaler Inhale 2 puffs into the lungs every 6 (six) hours as needed. For dyspnea  1 Inhaler  11  . amLODipine (NORVASC) 10 MG tablet Take 10 mg by mouth daily.      Marland Kitchen aspirin 81 MG tablet Take 1 tablet (81 mg total) by mouth daily.  30 tablet    . atorvastatin (LIPITOR) 10 MG tablet Take 1 tablet (10 mg total) by mouth at bedtime. For cholesterol.      . clopidogrel (PLAVIX) 75 MG tablet Take 1 tablet (75 mg total) by mouth daily with breakfast.  30 tablet  0  .  diclofenac (VOLTAREN) 25 MG EC tablet Take 25 mg by mouth 2 (two) times daily.      . fluPHENAZine (PROLIXIN) 5 MG tablet Take 10 mg by mouth at bedtime.      . Fluticasone-Salmeterol (ADVAIR DISKUS) 250-50 MCG/DOSE AEPB Inhale 1 puff into the lungs 2 (two) times daily. For COPD  60 each  11  . gabapentin (NEURONTIN) 300 MG capsule Take 300 mg by mouth 3 (three) times daily. For anxiety and depression.       . haloperidol decanoate (HALDOL DECANOATE) 100 MG/ML injection Inject 75 mg into the muscle every 28 (twenty-eight) days.      . hydrALAZINE (APRESOLINE) 50 MG tablet Take  1 tablet (50 mg total) by mouth 3 (three) times daily. For elevated blood pressure.  90 tablet  0  . hydrOXYzine (ATARAX/VISTARIL) 25 MG tablet Take 25 mg by mouth every 4 (four) hours as needed. For anxiety      . insulin glargine (LANTUS) 100 UNIT/ML injection Inject 30 Units into the skin at bedtime. For glycemic control.  10 mL    . metFORMIN (GLUCOPHAGE) 500 MG tablet Take 1 tablet (500 mg total) by mouth 2 (two) times daily with a meal. For glycemic control.      Marland Kitchen oxybutynin (DITROPAN-XL) 10 MG 24 hr tablet Take 10 mg by mouth daily.      . sertraline (ZOLOFT) 100 MG tablet Take 2 tablets (200 mg total) by mouth daily. For depression and anxiety.  60 tablet  0  . traZODone (DESYREL) 100 MG tablet Take 100 mg by mouth at bedtime.      . trihexyphenidyl (ARTANE) 2 MG tablet Take 1 mg by mouth at bedtime.      . trihexyphenidyl (ARTANE) 2 MG tablet Take 0.5 tablets (1 mg total) by mouth at bedtime. For EPS.  15 tablet  0    Home: Home Living Lives With:  (per chart, lives with boyfriend) Home Adaptive Equipment:  (per chart, uses RW) Additional Comments: Pt too lethargic to ask questions  Functional History:   Functional Status:  Mobility: Bed Mobility Bed Mobility: Scooting to HOB Scooting to HOB: 1: +2 Total assist Scooting to [facility]: Patient Percentage: 0% Transfers Transfers: Not assessed Ambulation/Gait Ambulation/Gait Assistance: Not tested (comment) Stairs: No Wheelchair Mobility Wheelchair Mobility: No  ADL:    Cognition: Cognition Arousal/Alertness: Lethargic Orientation Level: Oriented to person;Oriented to place;Oriented to time Cognition Overall Cognitive Status: Difficult to assess Difficult to assess due to: Level of arousal Arousal/Alertness: Lethargic Behavior During Session: Lethargic  Blood pressure 157/72, pulse 76, temperature 98.3 F (36.8 C), temperature source Oral, resp. rate 16, height 5\' 3"  (1.6 m), weight 98.8 kg (217 lb 13 oz), SpO2  98.00%. Physical Exam  Vitals reviewed. Constitutional:       Lethargic African American female  HENT:  Head: Normocephalic.  Neck:       Right carotid surgery healing nicely  Cardiovascular: Normal rate and regular rhythm.   Pulmonary/Chest:       Decreased breath sounds at the bases  Abdominal: Bowel sounds are normal. She exhibits no distension.       Obese  Musculoskeletal:       +1 edema lower extremities  Neurological:       Patient was lethargic and difficult to keep awake during exam. She did make eye contact with examiner. Her voice was very wet/ dysarthric. She moves all 4's.  She was able to state her name. She would follow simple one-step  commands.     Results for orders placed during the hospital encounter of 09/21/12 (from the past 24 hour(s))  GLUCOSE, CAPILLARY     Status: Abnormal   Collection Time   09/24/12  5:56 AM      Component Value Range   Glucose-Capillary 115 (*) 70 - 99 mg/dL  GLUCOSE, CAPILLARY     Status: Abnormal   Collection Time   09/24/12 11:10 AM      Component Value Range   Glucose-Capillary 192 (*) 70 - 99 mg/dL   Comment 1 Documented in Chart     Comment 2 Notify RN    GLUCOSE, CAPILLARY     Status: Abnormal   Collection Time   09/24/12  4:16 PM      Component Value Range   Glucose-Capillary 113 (*) 70 - 99 mg/dL   Comment 1 Documented in Chart     Comment 2 Notify RN    GLUCOSE, CAPILLARY     Status: Abnormal   Collection Time   09/24/12  8:37 PM      Component Value Range   Glucose-Capillary 148 (*) 70 - 99 mg/dL  GLUCOSE, CAPILLARY     Status: Abnormal   Collection Time   09/24/12 10:29 PM      Component Value Range   Glucose-Capillary 140 (*) 70 - 99 mg/dL   No results found.  Assessment/Plan: Diagnosis: hx left MCA infarct, recent right CEA 1. Does the need for close, 24 hr/day medical supervision in concert with the patient's rehab needs make it unreasonable for this patient to be served in a less intensive setting?  Potentially 2. Co-Morbidities requiring supervision/potential complications: asthma, copd, diarrhea 3. Due to bladder management, bowel management, safety, skin/wound care, disease management, medication administration, pain management and patient education, does the patient require 24 hr/day rehab nursing? Potentially 4. Does the patient require coordinated care of a physician, rehab nurse, PT (1-2 hrs/day, 5 days/week), OT (1-2 hrs/day, 5 days/week) and SLP (1-2\ hrs/day, 5 days/week) to address physical and functional deficits in the context of the above medical diagnosis(es)? Potentially Addressing deficits in the following areas: balance, endurance, locomotion, strength, transferring, bowel/bladder control, bathing, dressing, feeding, grooming, toileting, cognition, speech, language, swallowing and psychosocial support 5. Can the patient actively participate in an intensive therapy program of at least 3 hrs of therapy per day at least 5 days per week? Potentially 6. The potential for patient to make measurable gains while on inpatient rehab is fair 7. Anticipated functional outcomes upon discharge from inpatient rehab are ?supervision to min assit with PT, min assist? with OT, supervision to min asssit with SLP. 8. Estimated rehab length of stay to reach the above functional goals is: ? TBD 9. Does the patient have adequate social supports to accommodate these discharge functional goals? Potentially 10. Anticipated D/C setting: Home 11. Anticipated post D/C treatments: HH therapy 12. Overall Rehab/Functional Prognosis: good  RECOMMENDATIONS: This patient's condition is appropriate for continued rehabilitative care in the following setting: CIR? Patient has agreed to participate in recommended program. Potentially Note that insurance prior authorization may be required for reimbursement for recommended care.  Comment: Pt still quite lethargic. Once she becomes alert, need to see if she returns  to baseline (pre-CEA) level of function. Will follow.  Ivory Broad, MD    09/25/2012

## 2012-09-25 NOTE — Evaluation (Signed)
Occupational Therapy Evaluation Patient Details Name: Sherri Hays MRN: [id number] DOB: 1951/02/05 Today's Date: 09/25/2012 Time: 4098-1191 OT Time Calculation (min): 26 min  OT Assessment / Plan / Recommendation Clinical Impression  This 61 y.o. female with recent CVA, admitted for Rt. CEA.  Pt. now demonstrates significant Rt. hemiplegia.  She is very lethargic with confusion noted.  She will benefit from acute OT to maximize safety and independence with BADLs to allow pt. to return to min -mod A level with BADLs.  She will require 24 hour assist at d/c - anticipate SNF    OT Assessment  Patient needs continued OT Services    Follow Up Recommendations  SNF;Supervision/Assistance - 24 hour    Barriers to Discharge Decreased caregiver support    Equipment Recommendations  None recommended by PT;None recommended by OT    Recommendations for Other Services    Frequency  Min 3X/week    Precautions / Restrictions Precautions Precautions: Fall Restrictions Weight Bearing Restrictions: No       ADL  Eating/Feeding: Moderate assistance Where Assessed - Eating/Feeding: Bed level Where Assessed - Grooming: Supported sitting Upper Body Bathing: Maximal assistance Where Assessed - Upper Body Bathing: Supported sitting Lower Body Bathing: +1 Total assistance Where Assessed - Lower Body Bathing: Supine, head of bed up;Rolling right and/or left Upper Body Dressing: Maximal assistance Where Assessed - Upper Body Dressing: Supported sitting Lower Body Dressing: +1 Total assistance Where Assessed - Lower Body Dressing: Supine, head of bed up;Rolling right and/or left Toilet Transfer: +2 Total assistance Toilet Transfer: Patient Percentage: 30% Toilet Transfer Method: Sit to stand Toileting - Clothing Manipulation and Hygiene: +1 Total assistance Where Assessed - Toileting Clothing Manipulation and Hygiene: Rolling right and/or left Transfers/Ambulation Related to ADLs: pt requires  total A +2 to move sit to stand ADL Comments: Pt lethargic and requires max verbal cues to maintain arrousal to fully participate.  Pt. with very little movement actively Rt. UE    OT Diagnosis: Generalized weakness;Cognitive deficits;Hemiplegia non-dominant side  OT Problem List: Decreased strength;Decreased range of motion;Decreased activity tolerance;Impaired balance (sitting and/or standing);Decreased coordination;Decreased cognition;Decreased safety awareness;Decreased knowledge of use of DME or AE;Impaired tone;Impaired UE functional use;Obesity;Increased edema OT Treatment Interventions: Self-care/ADL training;Neuromuscular education;DME and/or AE instruction;Therapeutic activities;Splinting;Cognitive remediation/compensation;Patient/family education;Balance training;Visual/perceptual remediation/compensation (need vision assesment)   OT Goals Acute Rehab OT Goals OT Goal Formulation: With patient Time For Goal Achievement: 10/09/12 Potential to Achieve Goals: Good ADL Goals Pt Will Perform Eating: with set-up;Sitting, chair ADL Goal: Eating - Progress: Goal set today Pt Will Perform Grooming: with min assist;Sitting, chair ADL Goal: Grooming - Progress: Goal set today Pt Will Perform Upper Body Bathing: with min assist;Sitting, chair ADL Goal: Upper Body Bathing - Progress: Goal set today Pt Will Perform Lower Body Bathing: with mod assist;Sit to stand from chair;Sit to stand from bed ADL Goal: Lower Body Bathing - Progress: Goal set today Pt Will Perform Upper Body Dressing: with mod assist;Sit to stand from chair;Sit to stand from bed ADL Goal: Upper Body Dressing - Progress: Goal set today Pt Will Transfer to Toilet: with mod assist;Squat pivot transfer;3-in-1 ADL Goal: Toilet Transfer - Progress: Goal set today Additional ADL Goal #1: Pt. will use Rt. UE as a stablizer during BADLs ADL Goal: Additional Goal #1 - Progress: Goal set today  Visit Information  Last OT Received  On: 09/25/12 Assistance Needed: +2    Subjective Data  Subjective: "I live with my grandmother" Patient Stated Goal: Pt unable to state due to  confusion and lethargy   Prior Functioning     Home Living Lives With: Friend(s) (boyfriend per chart.) Available Help at Discharge: Available PRN/intermittently;Friend(s) Type of Home: House Home Access: Ramped entrance Home Layout: One level Home Adaptive Equipment: Walker - rolling;Wheelchair - manual;Bedside commode/3-in-1 Additional Comments: Pt unable to answer questions consistently on eval.  Pt. initially stating she lives with her 20 y.o. grandmother Prior Function Level of Independence: Needs assistance Needs Assistance: Transfers Transfer Assistance: min asssist per pt prior to admit Able to Take Stairs?: No Driving: No Communication Communication: Expressive difficulties (pt dysarthric) Dominant Hand: Left         Vision/Perception Vision - Assessment Eye Alignment: Within Functional Limits Additional Comments: Attempted to test vision, but difficulty to accurately asses due to level of lethargy.  Fields are very grossly intact - pt was able to locate items on Lt. and Rt. without difficulty.  Pt. able to read clock on wall without difficulty Perception Perception: Within Functional Limits Praxis Praxis: Intact   Cognition  Overall Cognitive Status: Difficult to assess Area of Impairment: Other (comment);Attention (lehtargy) Difficult to assess due to: Level of arousal Arousal/Alertness: Lethargic Orientation Level: Appears intact for tasks assessed Behavior During Session: Lethargic Current Attention Level: Focused Safety/Judgement: Decreased awareness of safety precautions;Decreased safety judgement for tasks assessed Awareness of Errors: Assistance required to identify errors made;Assistance required to correct errors made Cognition - Other Comments: Pt stated she lived with grandmother, but unable to determine  this was inaccurate dispite max verbal cues    Extremity/Trunk Assessment Right Upper Extremity Assessment RUE ROM/Strength/Tone: Deficits RUE ROM/Strength/Tone Deficits: Rt. UE Brunstrom stage II.  Pt. able to initiate elbow flexion with max  effort.  Hand Brunnstrom stage I.  PROM WFL. No c/o pain (minimal edema noted) RUE Sensation: Deficits RUE Sensation Deficits: Pt reports Rt. UE tingly when asked RUE Coordination: Deficits RUE Coordination Deficits: Rt. UE non functional Left Upper Extremity Assessment LUE ROM/Strength/Tone: Within functional levels (grossly assesed.  Able to assist with bed mobility) LUE Coordination: WFL - gross/fine motor     Mobility Bed Mobility Bed Mobility: Supine to Sit;Sitting - Scoot to [place] of Bed;Sit to Supine Rolling Right: 3: Mod assist;With rail Right Sidelying to Sit: 3: Mod assist;With rails;HOB elevated Supine to Sit: 2: Max assist;With rails;HOB elevated (30*) Sitting - Scoot to Edge of Bed: 3: Mod assist;With rail Sit to Supine: 2: Max assist;With rail;HOB flat Scooting to HOB: 1: +1 Total assist Scooting to [facility]: Patient Percentage: 0% Details for Bed Mobility Assistance: Pt. requires step by step verbal cues and assist to initiate all movement.  Pt. able to assist with LEs minimmaly, and able to push through Lt. UE to lift trunk Transfers Sit to Stand: 1: +2 Total assist;With upper extremity assist;From bed Sit to Stand: Patient Percentage: 30% Stand to Sit: 1: +2 Total assist;With upper extremity assist;With armrests;To chair/3-in-1 Stand to Sit: Patient Percentage: 30% Details for Transfer Assistance: Pt needed cues for hand placement with sit to stand and stand to sit.  Pt made several attempts to stand up from bed needing lots of cues to achieve technique and achieve position of trunk and LEs.  Pt stood first time and panicked and sat abruptly back onto bed needing assist at LEs to ensure that she got onto bed appropriately.  Pt states she  said she sat quickly due to having to urinate.  Got BS commode and placed to patient's left.  Pt transferred to left to 3N1 with postural stability  by PT and SPT to maintain balance, assist with weight shifting and for trunk, hip and knee stabililty.  Had to wipe pt after -pt urinated.  Assisted back to bed with same assist.         Shoulder Instructions     Exercise     Balance Balance Balance Assessed: Yes Static Sitting Balance Static Sitting - Balance Support: Left upper extremity supported Static Sitting - Level of Assistance: 5: Stand by assistance Static Sitting - Comment/# of Minutes: Pt. able to sit EOB x 8 mins   End of Session OT - End of Session Activity Tolerance: Patient limited by fatigue Patient left: in bed;with call bell/phone within reach  GO     Jaimarie Rapozo M 09/25/2012, 1:59 PM

## 2012-09-26 LAB — GLUCOSE, CAPILLARY
Glucose-Capillary: 122 mg/dL — ABNORMAL HIGH (ref 70–99)
Glucose-Capillary: 127 mg/dL — ABNORMAL HIGH (ref 70–99)

## 2012-09-26 MED ORDER — RESOURCE THICKENUP CLEAR PO POWD: ORAL | Status: AC

## 2012-09-26 NOTE — Progress Notes (Signed)
Clinical Social Work  Chiropodist for [employer]. Auth #: [id number] W   CSW spoke with patient regarding bed offers. Patient chose [facility]. CSW sent dc summary to facility and they are agreeable to admission today. CSW prepared dc packet with FL2, Medicaid authorization, letter of guarantee and hard scripts. CSW informed patient and RN of dc and both parties agreeable. Patient reports that she will contact boyfriend regarding dc. CSW coordinated transportation via [place]. CSW is signing off but available if needed.  [place], [place] [phone] (Coverage for Frederico Hamman)

## 2012-09-26 NOTE — Progress Notes (Signed)
VASCULAR AND VEIN SURGERY POST - OP CEA PROGRESS NOTE  Date of Surgery: 09/21/2012  Surgeon(s): Pryor Ochoa, MD 5 Days Post-Op right Carotid Endarterectomy .  HPI: Sherri Hays is a 61 y.o. female who is 5 Days Post-Op right Carotid Endarterectomy . Patient is doing well. Pre-operative symptoms are Improved Patient denies headache; Patient denies difficulty swallowing; denies weakness in upper or lower extremities; Pt. denies other symptoms of stroke or TIA.  IMAGING: Dg Swallowing Func-speech Pathology  09/25/2012  Riley Nearing [place], CCC-SLP     09/25/2012  1:31 PM Objective Swallowing Evaluation: Modified Barium Swallowing Study   Patient Details  Name: Sherri Hays MRN: [id number] Date of Birth: 1951/04/29  Today's Date: 09/25/2012 Time: 1040-1105 SLP Time Calculation (min): 25 min  Past Medical History:  Past Medical History  Diagnosis Date  . Dysphagia   . TIA (transient ischemic attack)   . Genital herpes   . DM neuropathies   . Anemia   . Arthritis   . Asthma   . Cancer     lt breast  . Schizophrenia, simple   . Diabetes mellitus   . Pneumonia     hx of  . Sleep apnea     "wears cpap, unsure of settings"  . Stroke     2011  . Neuromuscular disorder     diabetic neuropathy  . COPD (chronic obstructive pulmonary disease)   . Hypertension     physician home visits, Dr. Redmond School [phone]   Past Surgical History:  Past Surgical History  Procedure Date  . Cholecystectomy   . Cesarean section   . Tonsillectomy   . Endarterectomy 09/21/2012    Procedure: ENDARTERECTOMY CAROTID;  Surgeon: Pryor Ochoa,  MD;  [facility] OR;  Service: Vascular;  Laterality: Right;  . Patch angioplasty 09/21/2012    Procedure: PATCH ANGIOPLASTY;  Surgeon: Pryor Ochoa, MD;   Location: [facility] OR;  Service: Vascular;  Laterality: Right;  with  dacron patch angioplasty   HPI:  61 y/o female s/p Right carotid endarterectomy. Pt has a history  of left CVA approx 3 weeks ago with right sided weakness. Pt  reports  some coughing episodes.      Assessment / Plan / Recommendation Clinical Impression  Clinical impression: Pt presents with mild difficutly  manipulating solids due to missing dentition but not weakness.  Pharyngeal phase characterized by decreased closure of the  laryngeal vestibule with deep flash penetration on most trials.  There was no frank penetration/aspiration during the study. With  large sips the pt is at risk for aspiration before the swallow  due to a slight delay in intiation, increased with bolus size.  There is no residual and no apparent impact of right cartoid  endarterectory on swallow function. Suspect pts function is  baseline from prior CVA. Pt is safe to consume a dys 2 (fine  chopped diet) her preference and thin liquids with small sips, no  straws. SLP will f/u x1 to reinforce precautions.     Treatment Recommendation  Therapy as outlined in treatment plan below    Diet Recommendation Dysphagia 2 (Fine chop);Thin liquid   Liquid Administration via: Cup;No straw Medication Administration: Whole meds with puree Supervision: Full supervision/cueing for compensatory strategies Compensations: Slow rate;Small sips/bites Postural Changes and/or Swallow Maneuvers: Seated upright 90  degrees;Upright 30-60 min after meal    Other  Recommendations Oral Care Recommendations: Oral care BID   Follow Up Recommendations  None    Frequency  and Duration min 2x/week  1 week   Pertinent Vitals/Pain None    SLP Swallow Goals Patient will consume recommended diet without observed clinical  signs of aspiration with: Supervision/safety Patient will utilize recommended strategies during swallow to  increase swallowing safety with: Minimal cueing   General HPI: 61 y/o female s/p Right carotid endarterectomy. Pt  has a history of left CVA approx 3 weeks ago with right sided  weakness. Pt reports some coughing episodes.  Type of Study: Modified Barium Swallowing Study Reason for Referral: Objectively evaluate swallowing  function Previous Swallow Assessment: BSE 07/06/12 Diet Prior to this Study: Dysphagia 2 (chopped);Nectar-thick  liquids Temperature Spikes Noted: No Respiratory Status: Supplemental O2 delivered via (comment) History of Recent Intubation: Yes Length of Intubations (days): 1 days Behavior/Cognition: Alert;Cooperative;Pleasant mood;Decreased  sustained attention;Distractible;Requires cueing Oral Cavity - Dentition: Dentures, not available;Edentulous Oral Motor / Sensory Function: Impaired - see Bedside swallow  eval Self-Feeding Abilities: Able to feed self Patient Positioning: Upright in chair Baseline Vocal Quality: Hoarse Volitional Cough: Strong Volitional Swallow: Able to elicit Anatomy: Within functional limits Pharyngeal Secretions: Not observed secondary MBS    Reason for Referral Objectively evaluate swallowing function   Oral Phase Oral Preparation/Oral Phase Oral Phase: WFL   Pharyngeal Phase Pharyngeal Phase Pharyngeal Phase: Impaired Pharyngeal - Thin Pharyngeal - Thin Cup: Penetration/Aspiration during  swallow;Reduced airway/laryngeal closure Penetration/Aspiration details (thin cup): Material enters  airway, remains ABOVE vocal cords then ejected out Pharyngeal - Thin Straw: Penetration/Aspiration during  swallow;Reduced airway/laryngeal closure Penetration/Aspiration details (thin straw): Material enters  airway, remains ABOVE vocal cords then ejected out Pharyngeal - Solids Pharyngeal - Puree: Within functional limits Pharyngeal - Mechanical Soft: Within functional limits Pharyngeal - Pill: Within functional limits  Cervical Esophageal Phase    GO             Harlon Ditty, MA CCC-SLP [phone]  Claudine Mouton 09/25/2012, 1:30 PM      Significant Diagnostic Studies: CBC Lab Results  Component Value Date   WBC 19.0* 09/22/2012   HGB 10.9* 09/22/2012   HCT 34.1* 09/22/2012   MCV 83.8 09/22/2012   PLT 353 09/22/2012    BMET    Component Value Date/Time   NA 139 09/22/2012 0420     K 3.1* 09/22/2012 0420   CL 100 09/22/2012 0420   CO2 31 09/22/2012 0420   GLUCOSE 144* 09/22/2012 0420   BUN 3* 09/22/2012 0420   CREATININE 0.50 09/22/2012 0420   CALCIUM 7.9* 09/22/2012 0420   GFRNONAA >90 09/22/2012 0420   GFRAA >90 09/22/2012 0420    COAG Lab Results  Component Value Date   INR 1.03 09/20/2012   INR 0.91 05/28/2012   INR 0.94 01/23/2012   No results found for this basename: PTT      Intake/Output Summary (Last 24 hours) at 09/26/12 0753 Last data filed at 09/26/12 0045  Gross per 24 hour  Intake    240 ml  Output    450 ml  Net   -210 ml    Physical Exam:  BP Readings from Last 3 Encounters:  09/26/12 150/67  09/26/12 150/67  09/20/12 126/78   Temp Readings from Last 3 Encounters:  09/26/12 98.2 F (36.8 C) Oral  09/26/12 98.2 F (36.8 C) Oral  09/20/12 98.6 F (37 C)    SpO2 Readings from Last 3 Encounters:  09/26/12 100%  09/26/12 100%  09/20/12 97%   Pulse Readings from Last 3 Encounters:  09/26/12 88  09/26/12 88  09/20/12 98    Pt is Alert and drowsey  Speech is slow and garbled, but understandable right Neck Wound is healing well Patient with Negative tongue deviation and Negative facial droop Pt has fair  and equal strength in all extremities  Assessment/Plan:: EXIE CHRISMER is a 61 y.o. female is S/P Right Carotid endarterectomy Pt is voiding, ambulating and taking po well    Discharge to: Skilled nursing facility Follow-up in 2 weeks Will monitor urine output   Clinton Gallant [facility] [phone] 09/26/2012 7:53 AM

## 2012-09-26 NOTE — Progress Notes (Signed)
NT reported that Pt did not void, bladder scanned and it showed . In and out performed with 450 returned per protocol.  Will continue to monitor patient.

## 2012-09-26 NOTE — Progress Notes (Signed)
Speech Language Pathology Dysphagia Treatment Patient Details Name: ALFREDO COLLYMORE MRN: [id number] DOB: August 08, 1951 Today's Date: 09/26/2012 Time: 8295-6213 SLP Time Calculation (min): 15 min  Assessment / Plan / Recommendation Clinical Impression  Pt seen for f/u after MBS yesterday. SLP verbally reinforced safe swallow precautions, pt return demonstrated with max assist. Pt was confused, could not repeat strategies. Though risk of aspiration is mild, pt may benefit from f/u with SLP at SNF to reinforce aspriation precautions (posture, bolus size, no straws) until mental status improves    Diet Recommendation  Continue with Current Diet: Thin liquid;Dysphagia 2 (fine chop)    SLP Plan Continue with current plan of care   Pertinent Vitals/Pain NA   Swallowing Goals  SLP Swallowing Goals Patient will consume recommended diet without observed clinical signs of aspiration with: Supervision/safety Swallow Study Goal #1 - Progress: Progressing toward goal Patient will utilize recommended strategies during swallow to increase swallowing safety with: Minimal cueing Swallow Study Goal #2 - Progress: Progressing toward goal  General Temperature Spikes Noted: No Respiratory Status: Supplemental O2 delivered via (comment) Behavior/Cognition: Alert;Cooperative;Confused Oral Cavity - Dentition: Dentures, not available;Edentulous Patient Positioning: Postural control interferes with function  Oral Cavity - Oral Hygiene Does patient have any of the following "at risk" factors?: Oxygen therapy - cannula, mask, simple oxygen devices Patient is AT RISK - Oral Care Protocol followed (see row info): Yes   Dysphagia Treatment Treatment focused on: Skilled observation of diet tolerance;Patient/family/caregiver education;Utilization of compensatory strategies Treatment Methods/Modalities: Skilled observation Patient observed directly with PO's: Yes Type of PO's observed: Thin liquids Feeding: Needs  assist Liquids provided via: Cup;No straw Oral Phase Signs & Symptoms: Anterior loss/spillage Type of cueing: Verbal Amount of cueing: Moderate   GO     Falcon Mccaskey, Riley Nearing 09/26/2012, 4:27 PM

## 2012-09-26 NOTE — Progress Notes (Signed)
Clinical Social Work Department BRIEF PSYCHOSOCIAL ASSESSMENT 09/26/2012  Patient:  Sherri Hays, Sherri Hays     Account Number:  [id number]     Admit date:  09/21/2012  Clinical Social Worker:  Dennison Bulla  Date/Time:  09/26/2012 10:15 AM  Referred by:  Physician  Date Referred:  09/26/2012 Referred for  SNF Placement   Other Referral:   Interview type:  Patient Other interview type:    PSYCHOSOCIAL DATA Living Status:  FAMILY Admitted from facility:   Level of care:   Primary support name:  Marylene Land Primary support relationship to patient:  SIBLING Degree of support available:   Adequate    CURRENT CONCERNS Current Concerns  Post-Acute Placement   Other Concerns:    SOCIAL WORK ASSESSMENT / PLAN CSW received referral to assist with SNF placement. CSW reviewed chart and met with patient at bedside. No visitors present.    CSW introduced myself and explained role. Patient was drowsy but able to participate in assessment. Patient reports that she lives with boyfriend but he is not home during the day. CSW spoke with patient regarding SNF placement in order to regain strength. Patient is agreeable to this plan. CSW provided patient with SNF list and explained process. CSW explained search would include several counties due to patient's Medicaid. Patient agreed to review list.    CSW completed FL2 and submitted for pasarr. CSW faxed to [place], [place], [place] and [place] counties. CSW will follow up with bed offers.   Assessment/plan status:  Psychosocial Support/Ongoing Assessment of Needs Other assessment/ plan:   Information/referral to community resources:   SNF list    PATIENT'S/FAMILY'S RESPONSE TO PLAN OF CARE: Patient alert and able to participate in assessment. Patient agreeable to SNF placement and feels family would be agreeable as well.

## 2012-09-26 NOTE — Progress Notes (Addendum)
Clinical Social Work Department CLINICAL SOCIAL WORK PLACEMENT NOTE 09/26/2012  Patient:  Sherri Hays, Sherri Hays  Account Number:  [id number] Admit date:  09/21/2012  Clinical Social Worker:  Unk Lightning, LCSW  Date/time:  09/26/2012 10:15 AM  Clinical Social Work is seeking post-discharge placement for this patient at the following level of care:   SKILLED NURSING   (*CSW will update this form in Epic as items are completed)   09/26/2012  Patient/family provided with [facility] Department of Clinical Social Work's list of facilities offering this level of care within the geographic area requested by the patient (or if unable, by the patient's family).  09/26/2012  Patient/family informed of their freedom to choose among providers that offer the needed level of care, that participate in Medicare, Medicaid or managed care program needed by the patient, have an available bed and are willing to accept the patient.  09/26/2012  Patient/family informed of [facility]' ownership interest in [facility], as well as of the fact that they are under no obligation to receive care at this facility.  PASARR submitted to [employer] on 09/26/2012 PASARR number received from [employer] on 09/26/2012  FL2 transmitted to all facilities in geographic area requested by pt/family on  09/26/2012 FL2 transmitted to all facilities within larger geographic area on   Patient informed that his/her managed care company has contracts with or will negotiate with  certain facilities, including the following:     Patient/family informed of bed offers received:  09/26/12 Patient chooses bed at [facility] Physician recommends and patient chooses bed at    Patient to be transferred to [facility] on  09/26/12 Patient to be transferred to facility by [facility]  The following physician request were entered in Epic:   Additional Comments:

## 2012-09-26 NOTE — Discharge Summary (Signed)
[facility] Discharge Summary   Patient ID:  Sherri Hays MRN: [id number] DOB/AGE: 06-Jun-1951 61 y.o.  Admit date: 09/21/2012 Discharge date: 09/26/2012 Date of Surgery: 09/21/2012 Surgeon: Surgeon(s): Pryor Ochoa, MD  Admission Diagnosis: Severe Right Internal Carotid Artery Stenosis  Discharge Diagnoses:  Severe Right Internal Carotid Artery Stenosis  Secondary Diagnoses: Past Medical History  Diagnosis Date  . Dysphagia   . TIA (transient ischemic attack)   . Genital herpes   . DM neuropathies   . Anemia   . Arthritis   . Asthma   . Cancer     lt breast  . Schizophrenia, simple   . Diabetes mellitus   . Pneumonia     hx of  . Sleep apnea     "wears cpap, unsure of settings"  . Stroke     2011  . Neuromuscular disorder     diabetic neuropathy  . COPD (chronic obstructive pulmonary disease)   . Hypertension     physician home visits, Dr. Redmond School [phone]    Procedure(s): ENDARTERECTOMY CAROTID PATCH ANGIOPLASTY  Discharged Condition: stable  HPI: [name] is a 61 year old female came to the office on 09/19/12 to discuss right carotid endarterectomy. 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 MRI revealed a stroke in the left middle cerebral artery distribution. CT angiogram revealed left ICA occlusion and 80% right ICA stenosis. In the interim she has had a nuclear medicine stress test which revealed an ejection fraction of 55% and no evidence of ischemia. She has had no new neurologic symptoms. He denies lateralizing weakness, aphasia, amaurosis fugax, diplopia, blurred vision, or syncope.   Hospital Course:  Sherri Hays is a 61 y.o. female is S/P Right Procedure(s): ENDARTERECTOMY CAROTID PATCH ANGIOPLASTY Extubated: POD # 5 Post-op wounds healing well Pt. Ambulating, voiding and taking PO diet without difficulty. Pt pain controlled with PO pain  meds. Neuro exam stable  Dysphagia as pre-op - dys2 diet with thickened liquids RUE/RLE 3/5 strength from previous left brain stroke - stable Labs as below Complications:none  Consults:     Significant Diagnostic Studies: CBC Lab Results  Component Value Date   WBC 19.0* 09/22/2012   HGB 10.9* 09/22/2012   HCT 34.1* 09/22/2012   MCV 83.8 09/22/2012   PLT 353 09/22/2012    BMET    Component Value Date/Time   NA 139 09/22/2012 0420   K 3.1* 09/22/2012 0420   CL 100 09/22/2012 0420   CO2 31 09/22/2012 0420   GLUCOSE 144* 09/22/2012 0420   BUN 3* 09/22/2012 0420   CREATININE 0.50 09/22/2012 0420   CALCIUM 7.9* 09/22/2012 0420   GFRNONAA >90 09/22/2012 0420   GFRAA >90 09/22/2012 0420   COAG Lab Results  Component Value Date   INR 1.03 09/20/2012   INR 0.91 05/28/2012   INR 0.94 01/23/2012     Disposition:  Discharge to :home Discharge Orders    Future Appointments: Provider: Department: Dept Phone: Center:   10/03/2012 3:15 PM Pryor Ochoa, MD [facility] [phone] [facility]     Future Orders Please Complete By Expires   Resume previous diet      Driving Restrictions      Comments:   No driving for 2 weeks   Lifting restrictions      Comments:   No lifting for 4 weeks   Call MD for:  temperature >100.5  Call MD for:  redness, tenderness, or signs of infection (pain, swelling, bleeding, redness, odor or green/yellow discharge around incision site)      Call MD for:  severe or increased pain, loss or decreased feeling  in affected limb(s)      Increase activity slowly      Comments:   Walk with assistance use walker or cane as needed   May shower       Scheduling Instructions:   Saturday   No dressing needed      may wash over wound with mild soap and water      CAROTID Sugery: Call MD for difficulty swallowing or speaking; weakness in arms or legs that is a new symtom; severe headache.  If you have increased swelling in the  neck and/or  are having difficulty breathing, CALL 911         [place], [place]  Home Medication Instructions [facility]:[id number]   Printed on:09/26/12 1044  Medication Information                    albuterol (PROVENTIL HFA;VENTOLIN HFA) 108 (90 BASE) MCG/ACT inhaler Inhale 2 puffs into the lungs every 6 (six) hours as needed. For dyspnea           atorvastatin (LIPITOR) 10 MG tablet Take 1 tablet (10 mg total) by mouth at bedtime. For cholesterol.           Fluticasone-Salmeterol (ADVAIR DISKUS) 250-50 MCG/DOSE AEPB Inhale 1 puff into the lungs 2 (two) times daily. For COPD           hydrALAZINE (APRESOLINE) 50 MG tablet Take 1 tablet (50 mg total) by mouth 3 (three) times daily. For elevated blood pressure.           insulin glargine (LANTUS) 100 UNIT/ML injection Inject 30 Units into the skin at bedtime. For glycemic control.           metFORMIN (GLUCOPHAGE) 500 MG tablet Take 1 tablet (500 mg total) by mouth 2 (two) times daily with a meal. For glycemic control.           sertraline (ZOLOFT) 100 MG tablet Take 2 tablets (200 mg total) by mouth daily. For depression and anxiety.           trihexyphenidyl (ARTANE) 2 MG tablet Take 0.5 tablets (1 mg total) by mouth at bedtime. For EPS.           traZODone (DESYREL) 100 MG tablet Take 100 mg by mouth at bedtime.           amLODipine (NORVASC) 10 MG tablet Take 10 mg by mouth daily.           fluPHENAZine (PROLIXIN) 5 MG tablet Take 10 mg by mouth at bedtime.           gabapentin (NEURONTIN) 300 MG capsule Take 300 mg by mouth 3 (three) times daily. For anxiety and depression.            acetaminophen (TYLENOL) 325 MG tablet Take 2 tablets (650 mg total) by mouth every 6 (six) hours as needed for pain or fever (or Fever >/= 101).           aspirin 81 MG tablet Take 1 tablet (81 mg total) by mouth daily.           clopidogrel (PLAVIX) 75 MG tablet Take 1 tablet (75 mg total) by mouth daily with breakfast.  trihexyphenidyl (ARTANE) 2 MG tablet Take 1 mg by mouth at bedtime.           diclofenac (VOLTAREN) 25 MG EC tablet Take 25 mg by mouth 2 (two) times daily.           hydrOXYzine (ATARAX/VISTARIL) 25 MG tablet Take 25 mg by mouth every 4 (four) hours as needed. For anxiety           haloperidol decanoate (HALDOL DECANOATE) 100 MG/ML injection Inject 75 mg into the muscle every 28 (twenty-eight) days.           oxybutynin (DITROPAN-XL) 10 MG 24 hr tablet Take 10 mg by mouth daily.           oxyCODONE-acetaminophen (PERCOCET/ROXICET) 5-325 MG per tablet Take 1-2 tablets by mouth every 4 (four) hours as needed.           Maltodextrin-Xanthan Gum (RESOURCE THICKENUP CLEAR) POWD usem in liquids as needed            Verbal and written Discharge instructions given to the patient. Wound care per Discharge AVS Follow-up Information    Follow up with [name], MD. In 2 weeks. (office will arrange-sent)    Contact information:   [address] [place] [address] [phone]          Signed: [name] 09/26/2012, 10:44 AM    Agree with above Neurologic exam at baseline Right neck incision healing nicely Patient swallowing at baseline level  I will see patient in followup in 2 weeks

## 2012-09-26 NOTE — Progress Notes (Addendum)
Pt still very lethargic, limited participation.   Therapists recommend SNF.  CIR will sign off.  Left vm for pt's CM, Raynelle Fanning, about this. [phone]

## 2012-09-26 NOTE — Progress Notes (Signed)
Bladder scanned again at time  with 311 ml  Returned. Md will  be notified by morning nurse. Will continue  to monitor.

## 2012-10-02 ENCOUNTER — Encounter: Payer: Self-pay | Admitting: Vascular Surgery

## 2012-10-03 ENCOUNTER — Ambulatory Visit: Payer: Self-pay | Admitting: Vascular Surgery

## 2012-10-10 ENCOUNTER — Encounter: Payer: Self-pay | Admitting: Vascular Surgery

## 2012-10-10 ENCOUNTER — Ambulatory Visit (INDEPENDENT_AMBULATORY_CARE_PROVIDER_SITE_OTHER): Payer: Medicaid Other | Admitting: Vascular Surgery

## 2012-10-10 VITALS — BP 138/61 | HR 71 | Ht 63.0 in | Wt 220.0 lb

## 2012-10-10 DIAGNOSIS — I63239 Cerebral infarction due to unspecified occlusion or stenosis of unspecified carotid arteries: Secondary | ICD-10-CM

## 2012-10-10 DIAGNOSIS — Z48812 Encounter for surgical aftercare following surgery on the circulatory system: Secondary | ICD-10-CM

## 2012-10-10 DIAGNOSIS — I6529 Occlusion and stenosis of unspecified carotid artery: Secondary | ICD-10-CM

## 2012-10-10 NOTE — Progress Notes (Signed)
Subjective:     Patient ID: Sherri Hays, female   DOB: 11/05/1950, 61 y.o.   MRN: [id number]  HPI this 61 year old female returns for initial followup following her right carotid endarterectomy performed 09/21/2012. 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 She has chronic right sided weakness which has not changed since her surgery. She states she is swallowing well and has no hoarseness. She is in a nursing facility and is on nasal oxygen.  Review of Systems     Objective:   Physical ExamBP 138/61  Pulse 71  Ht 5\' 3"  (1.6 m)  Wt 220 lb (99.791 kg)  BMI 38.97 kg/m2  SpO2 97%  Gen. chronically ill-appearing female in a wheelchair with nasal oxygen. Right neck incision well healed with no hematoma. 3+ carotid pulse. No bruits audible. Neurologic exam speech is slow but clear. Mild to moderate right-handed paresis. No other neuro deficits noted.    Assessment:     Stable 2 weeks post right carotid endarterectomy with known left ICA occlusion and previous left brain CVA 3 months ago currently in nursing facility    Plan:     Return in 6 months with followup carotid duplex exam. Continue daily aspirin

## 2012-10-11 NOTE — Addendum Note (Signed)
Addended by: Sharee Pimple on: 10/11/2012 03:02 PM   Modules accepted: Orders

## 2012-12-30 IMAGING — CR DG CHEST 2V
2 series · 2 of 2 positions shown · non-contrast
Comparison: PA and lateral chest 01/22/2012 CT chest 01/23/2012.

CLINICAL DATA: Chest pain.  Hypertension.

CHEST - 2 VIEW

[w chest lat]
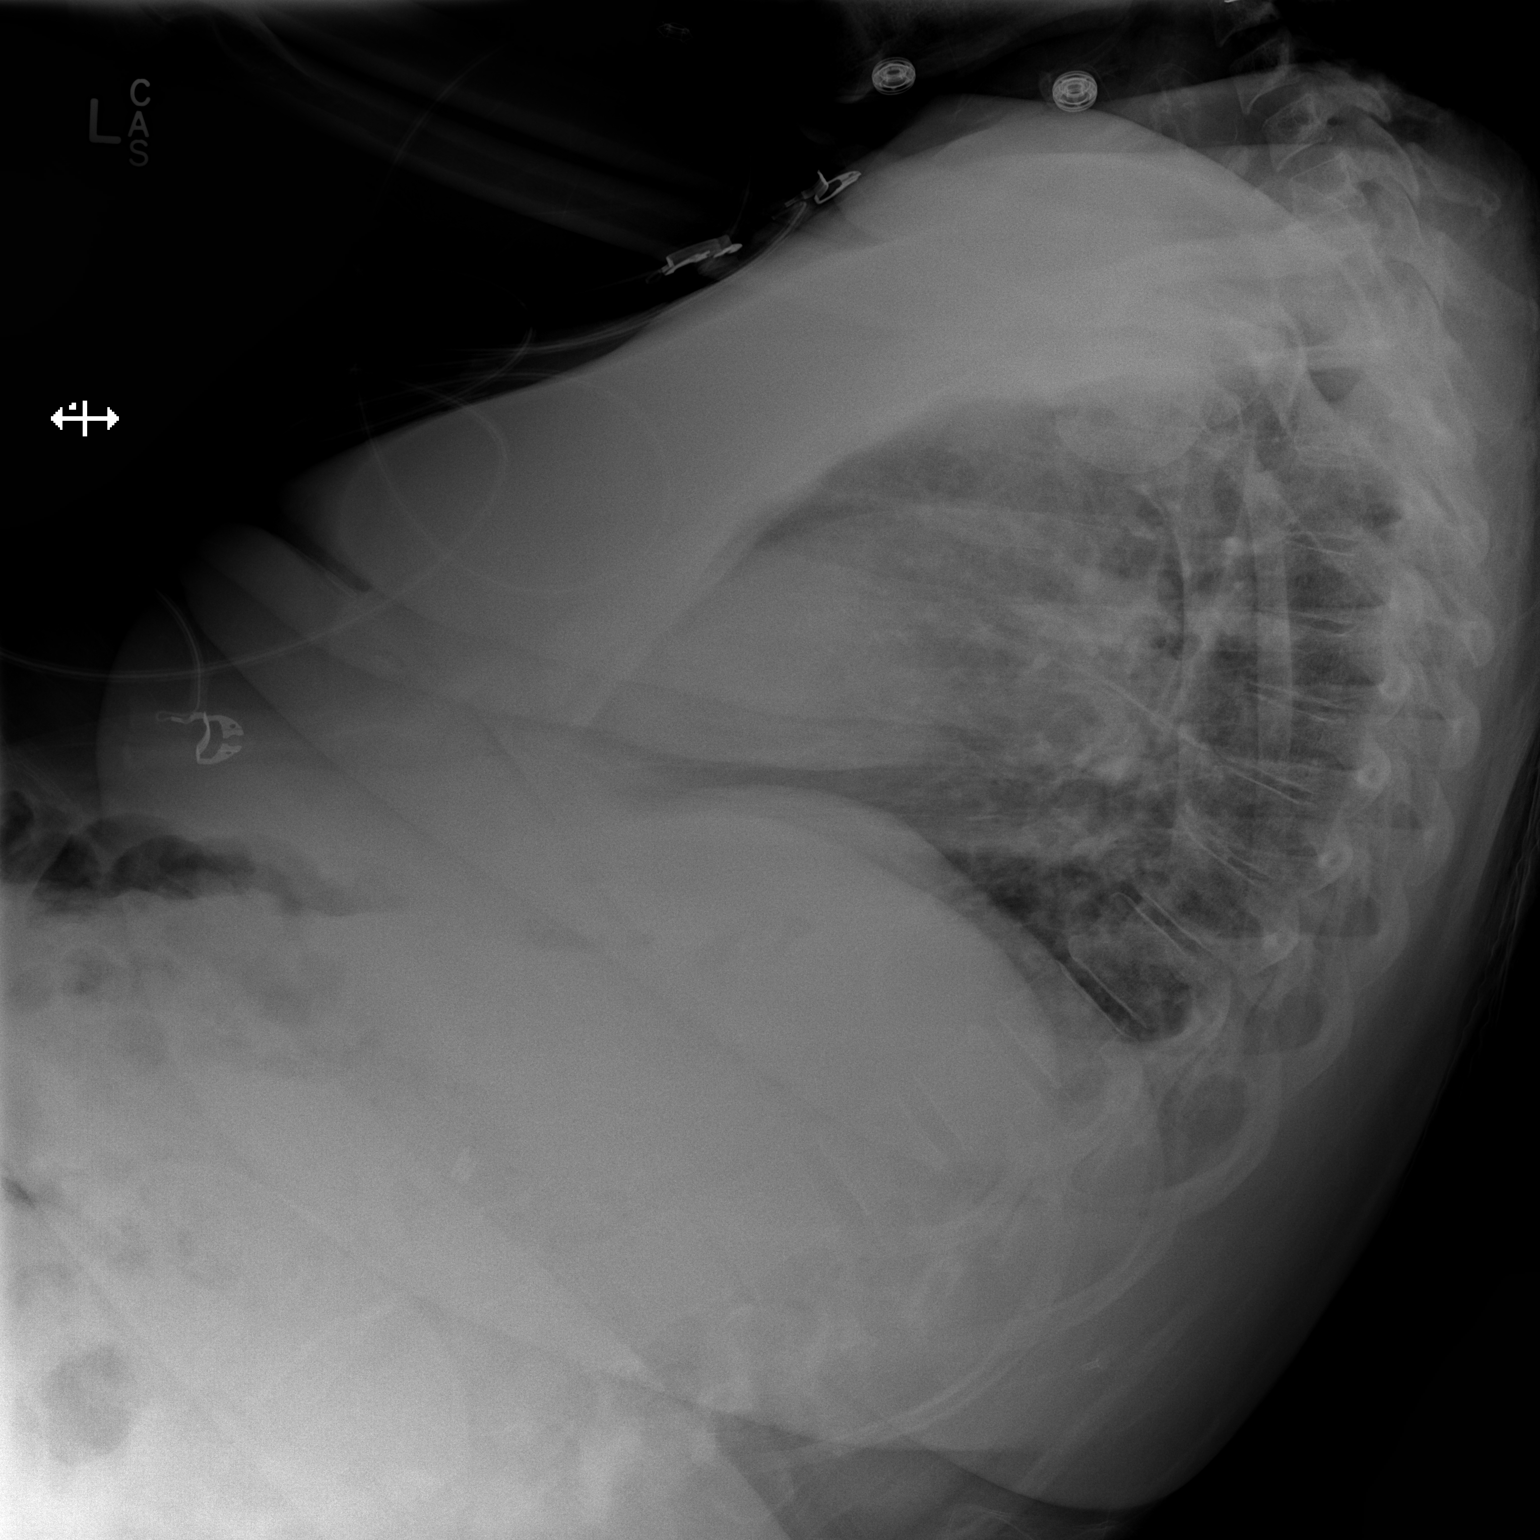

[x chest ap]
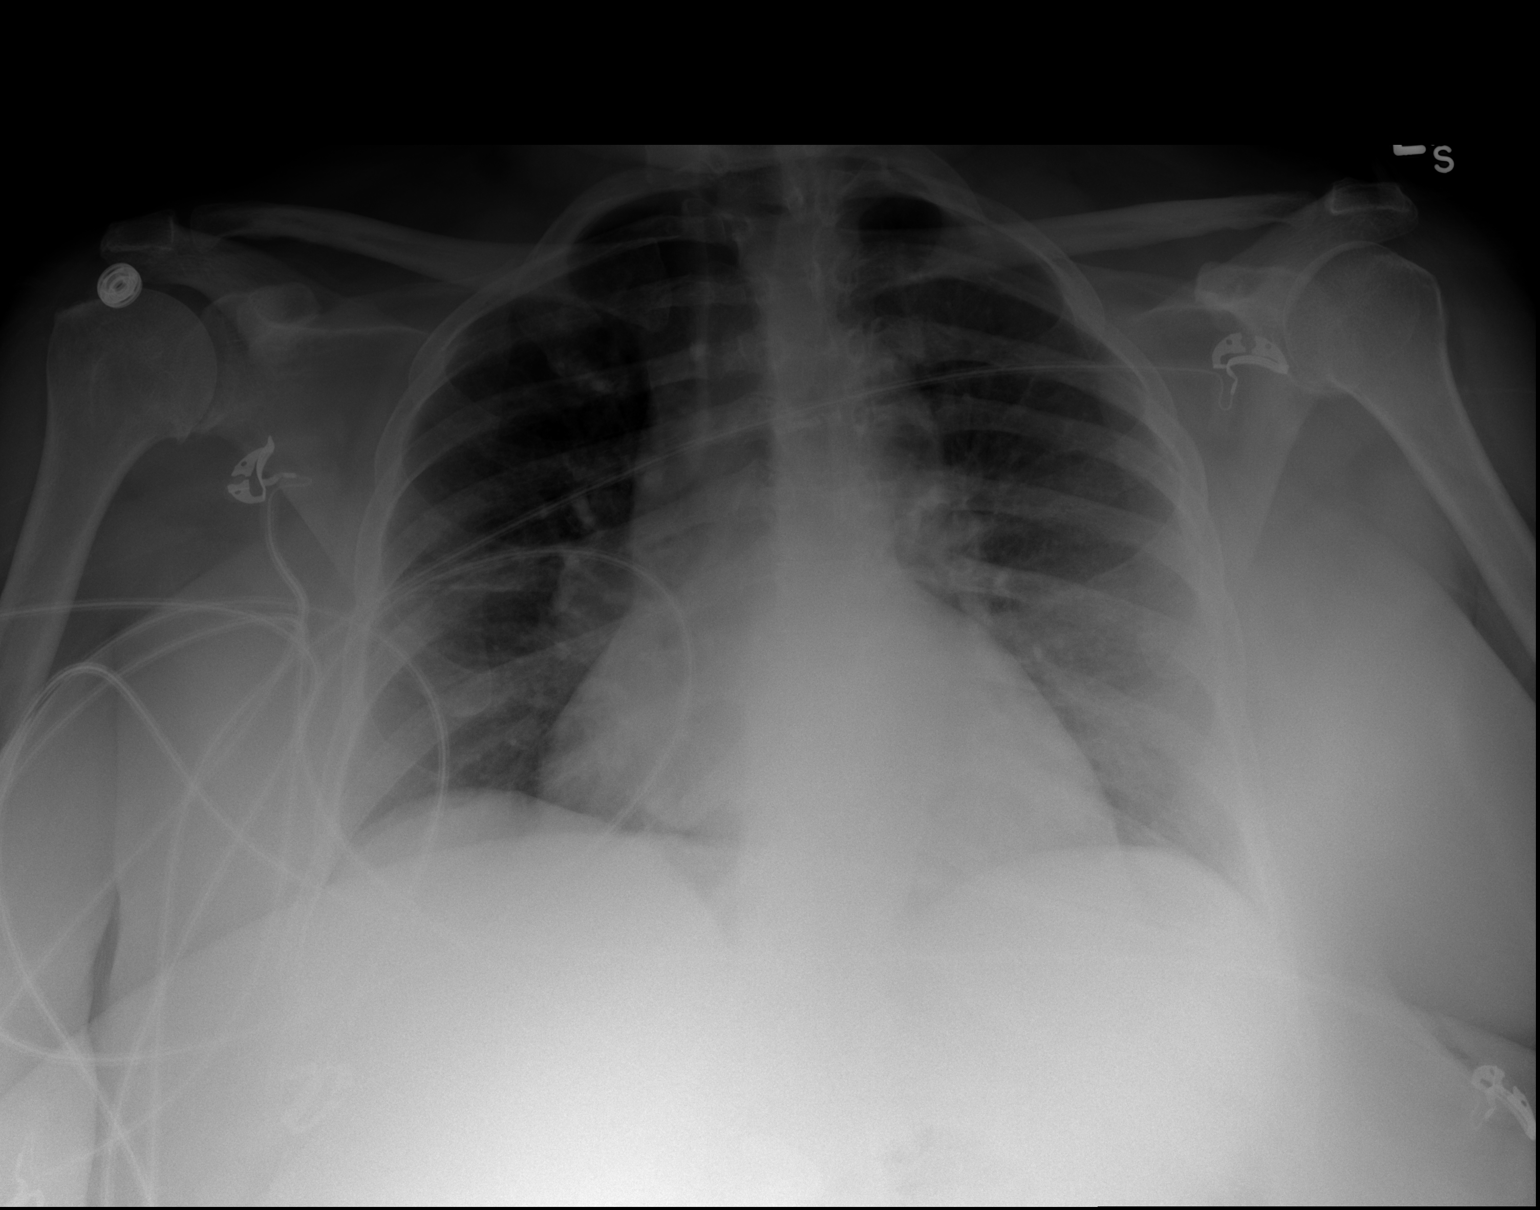

[2 of 2 positions shown; findings below may reference images not displayed]

FINDINGS: Lungs are clear.  There is mild cardiomegaly.  No
pneumothorax or effusion.
IMPRESSION: No acute disease.

## 2013-04-10 ENCOUNTER — Other Ambulatory Visit: Payer: Self-pay

## 2013-04-10 ENCOUNTER — Ambulatory Visit: Payer: Self-pay | Admitting: Vascular Surgery

## 2013-05-11 IMAGING — CT CT HEAD W/O CM
2 series · 15 of 30 positions shown, 19 images · non-contrast
Comparison: 07/07/2012

CLINICAL DATA: Right-sided weakness and fatigue.

CT HEAD WITHOUT CONTRAST
TECHNIQUE: Contiguous axial images were obtained from the base of
the skull through the vertex without contrast.

[Series 2: brain · axial · 0.47mm/px · z∈[+110,+231]mm · 13 of 52 slices shown, 17 images]
[im 4/52  brain]
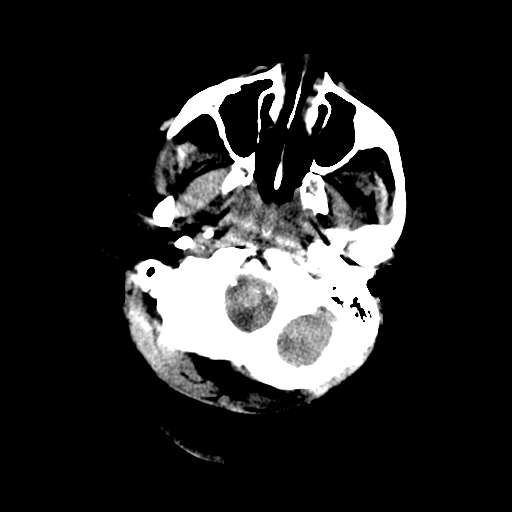
[im 4/52  bone]
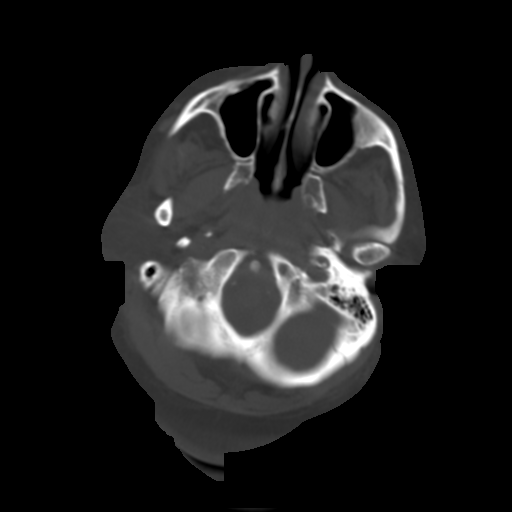
[im 8/52  brain]
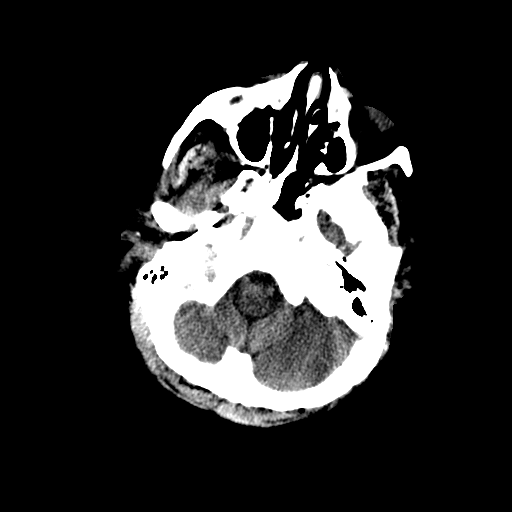
[im 11/52  brain]
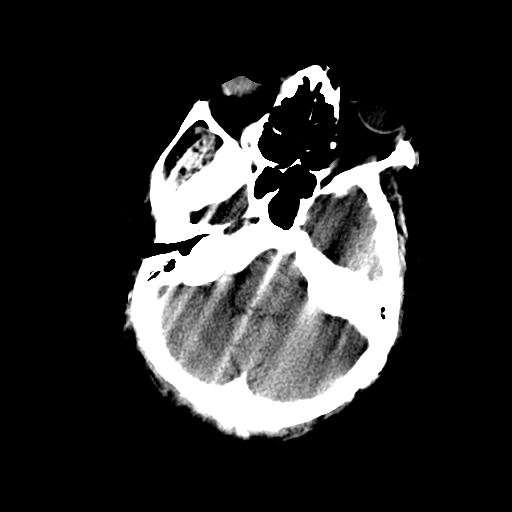
[im 15/52  brain]
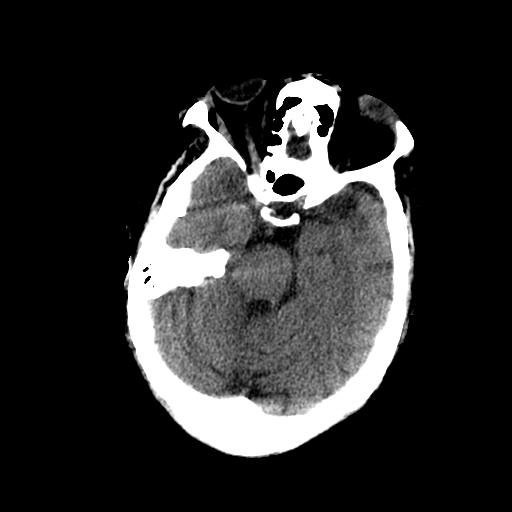
[im 19/52  brain]
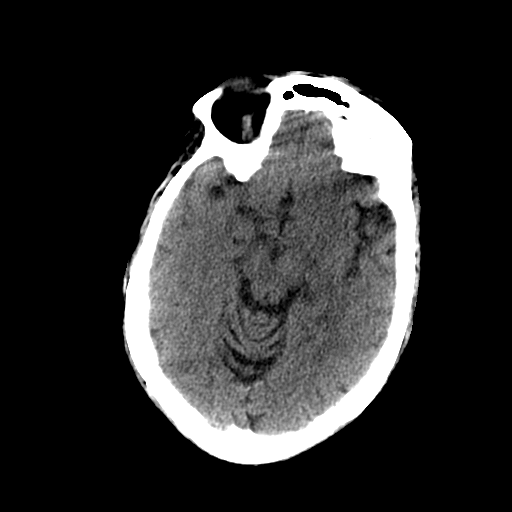
[im 19/52  bone]
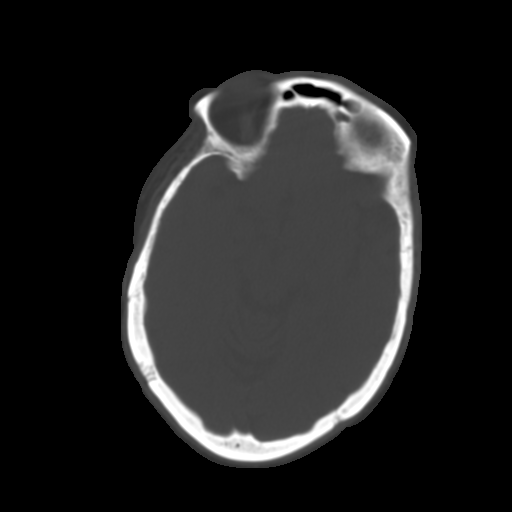
[im 22/52  brain]
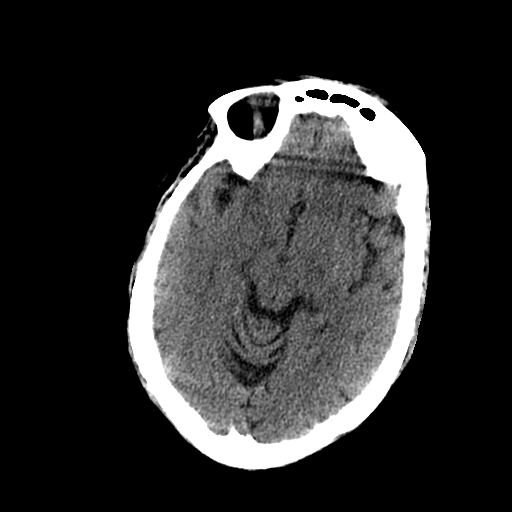
[im 26/52  brain]
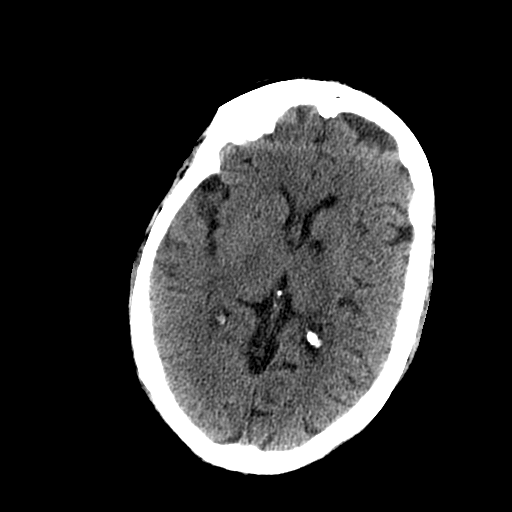
[im 30/52  brain]
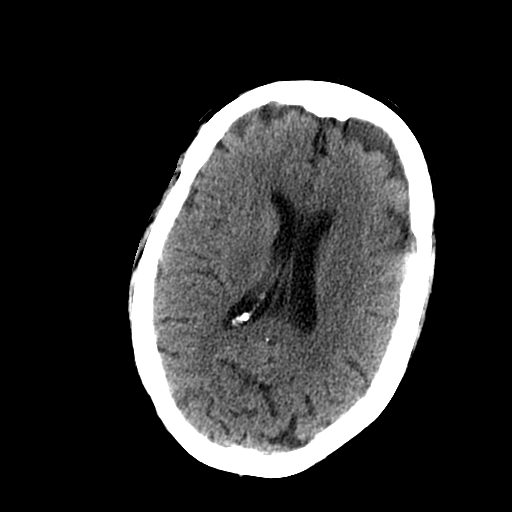
[im 33/52  brain]
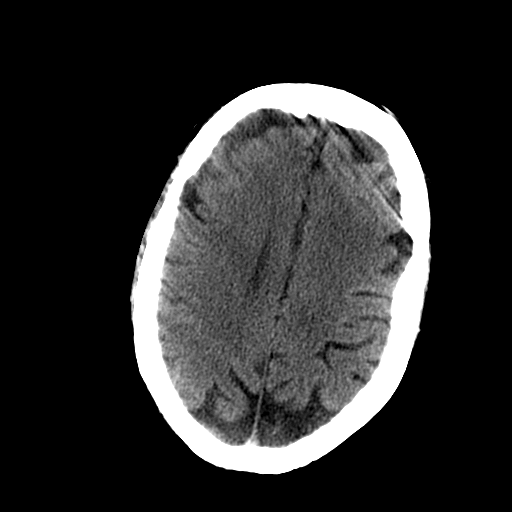
[im 33/52  bone]
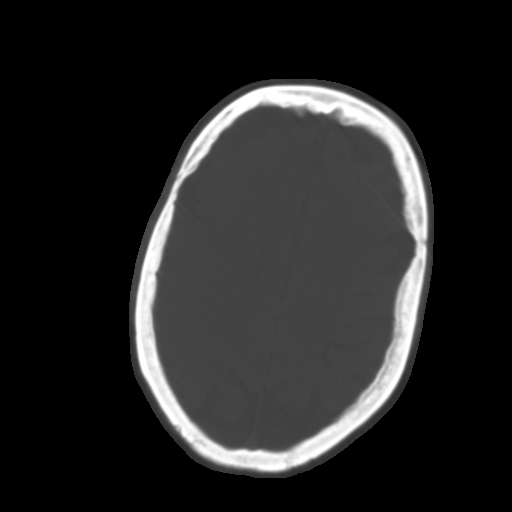
[im 37/52  brain]
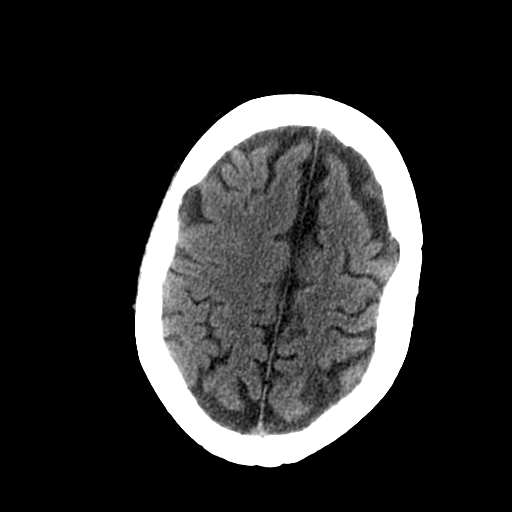
[im 41/52  brain]
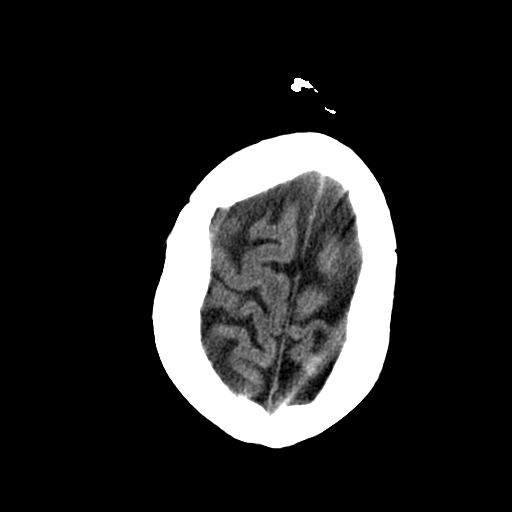
[im 44/52  brain]
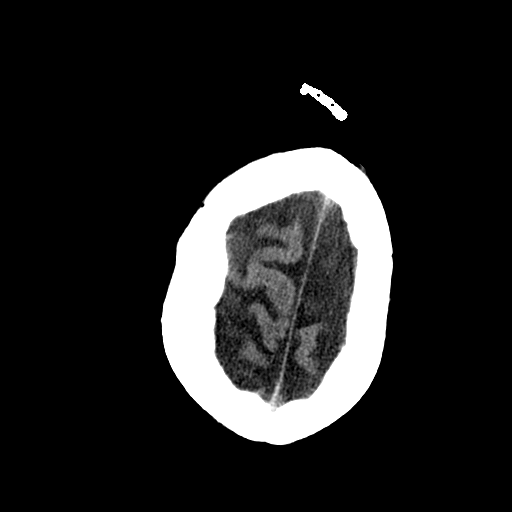
[im 48/52  brain]
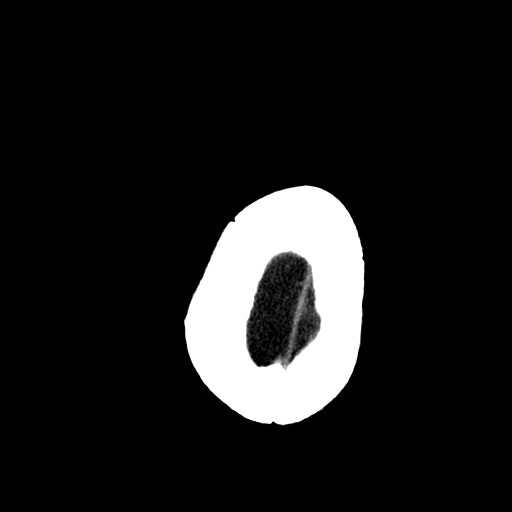
[im 48/52  bone]
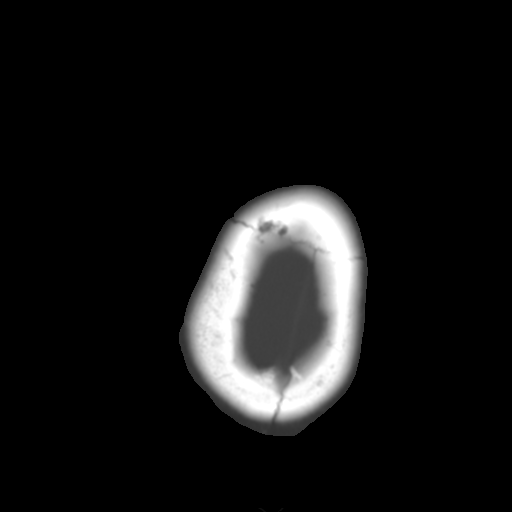

[Series 3: recon 2: brain · axial · 0.47mm/px · z∈[+110,+130]mm · 2 of 52 slices shown]
[im 4/52  brain]
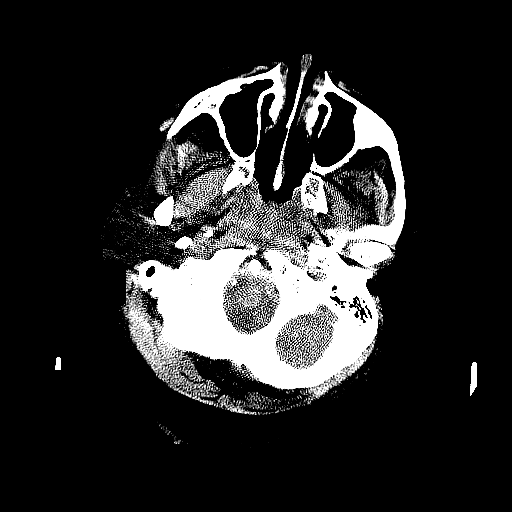
[im 11/52  brain]
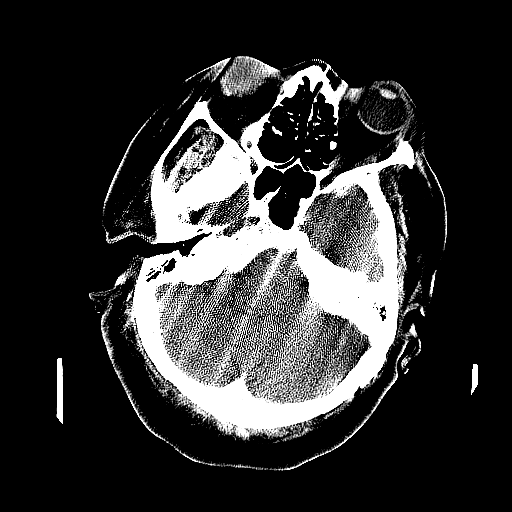

[15 of 30 positions shown; findings below may reference images not displayed]

FINDINGS: Despite efforts by the patient and technologist, motion
artifact is present on some series of today's examination and could
not be totally eliminated.  This reduces diagnostic sensitivity and
specificity.

Increased CT conspicuity of the left basal ganglia and internal
capsule lacunar infarcts noted.  The previously are demonstrated
small lacunar infarcts in the periventricular white matter on the
left are still relatively inapparent.

No significant associated hemorrhage noted.  No hydrocephalus or
intraventricular hemorrhage.  Basilar cisterns unremarkable.

Cerebral atrophy observed.

Incidental failure of fusion of the anterior arch and of the
posterolateral arches of C1 noted.
IMPRESSION: 1.  Hypodensities in the left basal ganglia and internal capsule
represent increasing ounce conspicuity of the previously
demonstrated infarcts from last month.  No definite new infarct
compared to the prior MRI examination.  No intracranial hemorrhage
or mass lesion observed.
2.  Failure of fusion of the ring of C1.
3.  Cerebral atrophy.

## 2013-06-25 ENCOUNTER — Encounter: Payer: Self-pay | Admitting: Family

## 2013-06-26 ENCOUNTER — Encounter: Payer: Self-pay | Admitting: Family

## 2013-06-26 ENCOUNTER — Other Ambulatory Visit: Payer: Self-pay

## 2013-06-26 ENCOUNTER — Ambulatory Visit (INDEPENDENT_AMBULATORY_CARE_PROVIDER_SITE_OTHER): Payer: Medicaid Other | Admitting: Family

## 2013-06-26 DIAGNOSIS — M79609 Pain in unspecified limb: Secondary | ICD-10-CM | POA: Insufficient documentation

## 2013-06-26 DIAGNOSIS — Z48812 Encounter for surgical aftercare following surgery on the circulatory system: Secondary | ICD-10-CM | POA: Insufficient documentation

## 2013-06-26 DIAGNOSIS — I6529 Occlusion and stenosis of unspecified carotid artery: Secondary | ICD-10-CM

## 2013-06-26 NOTE — Patient Instructions (Signed)
Stroke Prevention Some medical conditions and some behaviors are associated with an increased chance of having a stroke. You may prevent a stroke by making healthy choices and managing medical conditions. You can reduce your risk of having a stroke by:  Staying physically active. It is recommended that you get at least 30 minutes of activity on most or all days.   Stop smoking.   Limiting alcohol use. Moderate alcohol use is considered to be: No more than 2 drinks per day for men. No more than 1 drink per day for non-pregnant women.   DIET.  A low fat, low cholesterol, low salt and high fiber diet with servings of fruits and vegetables daily will help .   Managing your cholesterol levels.. Take any prescribed medications to control cholesterol as directed by your caregiver.   Managing your diabetes. Diabetic diet as recommended by the ADA. Take any prescribed medications to control diabetes as directed by your caregiver.   Controlling your high blood pressure (hypertension). It is very important to take any prescribed medications to control hypertension .   Aspirin: Take Aspirin daily as prescribed by your physician. Do not take aspirin with blood thinners unless prescribed by your Physician  You may be on "blood thinners" (anticoagulants) Coumadin, Plavix, Agrennox, Effient. All these medications are helpful in reducing the risk of forming abnormal blood clots. If you have the irregular heart rhythm of atrial fibrillation, you may be on a blood thinner.  Be sure you understand all your medication instructions.   SIGNS OF STROKE: SEEK IMMEDIATE MEDICAL CARE IF: You have sudden weakness or numbness of the face, arm, or leg, especially on 1 side of the body.  You have sudden confusion.  You have trouble speaking (aphasia) or understanding.  You have sudden trouble seeing in 1 or both eyes.  You have sudden trouble walking.  You have dizziness.  You have a loss of balance or coordination.   You have a sudden severe headache with no known cause.    You have new chest pain, angina, or an irregular heartbeat.   ANY OF THESE SYMPTOMS MAY REPRESENT A SERIOUS PROBLEM THAT IS AN EMERGENCY. Do not wait to see if the symptoms will go away. Get medical help right away. Call your local emergency services (911 in [place] DO NOT drive yourself to the hospital. Stroke Prevention Some medical conditions and some behaviors are associated with an increased chance of having a stroke. You may prevent a stroke by making healthy choices and managing medical conditions. You can reduce your risk of having a stroke by:  Staying physically active. It is recommended that you get at least 30 minutes of activity on most or all days.   Not smoking.   Limiting alcohol use. Moderate alcohol use is considered to be:   No more than 2 drinks per day for men.   No more than 1 drink per day for nonpregnant women.   Eating healthy foods. Certain diets may be prescribed to address high blood pressure, high cholesterol, diabetes, or obesity. A diet that includes 5 or more servings of fruits and vegetables a day may reduce the risk of stroke.   Managing your cholesterol levels. A low saturated fat, low trans fat, low cholesterol, and high fiber diet may control cholesterol levels. Take any prescribed medications to control cholesterol as directed by your caregiver.   Managing your diabetes. A controlled carbohydrate, controlled sugar diet is recommended to manage diabetes. Take any prescribed medications to   control diabetes as directed by your caregiver.   Controlling your high blood pressure (hypertension). A low salt (sodium), low saturated fat, low trans fat, and low cholesterol diet is recommended to manage high blood pressure. Take any prescribed medications to control hypertension as directed by your caregiver.   Maintaining a healthy weight. A reduced calorie, low sodium, low saturated fat, low trans fat, low  cholesterol diet is recommended to manage weight.   Stopping drug abuse.   Taking medications as directed by your caregiver. For some individuals, aspirin or "blood thinners" (anticoagulants) are helpful in reducing the risk of forming abnormal blood clots that can lead to stroke. If you have the irregular heart rhythm of atrial fibrillation, you should be on a blood thinner unless there is a good reason you cannot take them. Be sure you understand all your medication instructions.  SEEK IMMEDIATE MEDICAL CARE IF:  You have sudden weakness or numbness of the face, arm, or leg, especially on 1 side of the body.   You have sudden confusion.   You have trouble speaking (aphasia) or understanding.   You have sudden trouble seeing in 1 or both eyes.   You have sudden trouble walking.   You have a loss of balance or coordination.   You have a sudden severe headache with no known cause.    ANY OF THESE SYMPTOMS MAY REPRESENT A SERIOUS PROBLEM THAT IS AN EMERGENCY. Do not wait to see if the symptoms will go away. Get medical help right away. Call your local emergency services (911 in [place] DO NOT drive yourself to the hospital. Document Released: 11/25/2004 Document Re-Released: 04/07/2010 [employer] Patient Information 2011 [employer]  

## 2013-06-26 NOTE — Progress Notes (Addendum)
Established Carotid Patient  History of Present Illness  Sherri Hays is a 62 y.o. female who had a right CEA on 09/21/2012 by Dr. Hart Rochester. 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 She has chronic right arm weakness which has not changed since her surgery. She states she is swallowing well and has no hoarseness. She is in a nursing facility and is on nasal oxygen at night.She was scheduled to have a carotid Duplex today but this was declined by her insurance; apparently her ins. will cover a yearly carotid US and a year after her right CEA will be November this year.  Patient states she walks "a good ways" before her left hip starts to hurt, this is relieved by sitting. Has what sounds like neuropathy symptoms in her feet at times (is diabetic), with numbness, tingling, and burning intermittently. She complains of left eye blurry vision at times.  The patient's previous neurologic deficits are Unchanged.  Patient  denies New Medical or Surgical History.  Pt Diabetic: Yes Pt smoker: smoker  (4 cigarettes/day x 31 yrs)  Pt meds include: Statin : Yes Betablocker: No  ASA: Yes Other anticoagulants/antiplatelets: Plavix   Past Medical History  Diagnosis Date  . Dysphagia   . TIA (transient ischemic attack)   . Genital herpes   . DM neuropathies   . Anemia   . Arthritis   . Asthma   . Cancer     lt breast  . Schizophrenia, simple   . Diabetes mellitus   . Pneumonia     hx of  . Sleep apnea     "wears cpap, unsure of settings"  . Stroke     2011  . Neuromuscular disorder     diabetic neuropathy  . COPD (chronic obstructive pulmonary disease)   . Hypertension     physician home visits, Dr. Redmond School [phone]  . Hyperlipidemia   . DJD (degenerative joint disease)     Social History History  Substance Use Topics  . Smoking status: Current Every Day Smoker -- 0.50 packs/day    Types: Cigarettes  . Smokeless tobacco: Never  Used  . Alcohol Use: No    Family History Family History  Problem Relation Age of Onset  . Family history unknown: Yes    Surgical History Past Surgical History  Procedure Laterality Date  . Cholecystectomy    . Cesarean section    . Tonsillectomy    . Endarterectomy  09/21/2012    Procedure: ENDARTERECTOMY CAROTID;  Surgeon: Pryor Ochoa, MD;  Location: [facility] OR;  Service: Vascular;  Laterality: Right;  . Patch angioplasty  09/21/2012    Procedure: PATCH ANGIOPLASTY;  Surgeon: Pryor Ochoa, MD;  Location: [facility] OR;  Service: Vascular;  Laterality: Right;  with dacron patch angioplasty  . Carotid endarterectomy      Allergies  Allergen Reactions  . Penicillins Other (See Comments)    unknown    Current Outpatient Prescriptions  Medication Sig Dispense Refill  . acetaminophen (TYLENOL) 325 MG tablet Take 2 tablets (650 mg total) by mouth every 6 (six) hours as needed for pain or fever (or Fever >/= 101).      Marland Kitchen albuterol (PROVENTIL HFA;VENTOLIN HFA) 108 (90 BASE) MCG/ACT inhaler Inhale 2 puffs into the lungs every 6 (six) hours as needed. For dyspnea  1 Inhaler  11  . amLODipine (NORVASC) 10 MG tablet Take 10 mg by mouth daily.      Marland Kitchen  aspirin 81 MG tablet Take 1 tablet (81 mg total) by mouth daily.  30 tablet    . atorvastatin (LIPITOR) 10 MG tablet Take 1 tablet (10 mg total) by mouth at bedtime. For cholesterol.      . clopidogrel (PLAVIX) 75 MG tablet Take 1 tablet (75 mg total) by mouth daily with breakfast.  30 tablet  0  . diclofenac (VOLTAREN) 25 MG EC tablet Take 25 mg by mouth 2 (two) times daily.      . fluPHENAZine (PROLIXIN) 5 MG tablet Take 10 mg by mouth at bedtime.      . Fluticasone-Salmeterol (ADVAIR DISKUS) 250-50 MCG/DOSE AEPB Inhale 1 puff into the lungs 2 (two) times daily. For COPD  60 each  11  . gabapentin (NEURONTIN) 300 MG capsule Take 300 mg by mouth 3 (three) times daily. For anxiety and depression.       . haloperidol decanoate (HALDOL DECANOATE) 100  MG/ML injection Inject 75 mg into the muscle every 28 (twenty-eight) days.      . hydrOXYzine (ATARAX/VISTARIL) 25 MG tablet Take 25 mg by mouth every 4 (four) hours as needed. For anxiety      . insulin glargine (LANTUS) 100 UNIT/ML injection Inject 30 Units into the skin at bedtime. For glycemic control.  10 mL    . Maltodextrin-Xanthan Gum (RESOURCE THICKENUP CLEAR) POWD usem in liquids as needed      . metFORMIN (GLUCOPHAGE) 500 MG tablet Take 1 tablet (500 mg total) by mouth 2 (two) times daily with a meal. For glycemic control.      Marland Kitchen oxybutynin (DITROPAN-XL) 10 MG 24 hr tablet Take 10 mg by mouth daily.      Marland Kitchen oxyCODONE-acetaminophen (PERCOCET/ROXICET) 5-325 MG per tablet Take 1-2 tablets by mouth every 4 (four) hours as needed.  20 tablet  0  . traZODone (DESYREL) 100 MG tablet Take 100 mg by mouth at bedtime.      . trihexyphenidyl (ARTANE) 2 MG tablet Take 1 mg by mouth at bedtime.      . hydrALAZINE (APRESOLINE) 50 MG tablet Take 1 tablet (50 mg total) by mouth 3 (three) times daily. For elevated blood pressure.  90 tablet  0  . sertraline (ZOLOFT) 100 MG tablet Take 2 tablets (200 mg total) by mouth daily. For depression and anxiety.  60 tablet  0  . trihexyphenidyl (ARTANE) 2 MG tablet Take 0.5 tablets (1 mg total) by mouth at bedtime. For EPS.  15 tablet  0   No current facility-administered medications for this visit.    Review of Systems : [x]  Positive   [ ]  Denies  General:[ ]  Weight loss,  [ ]  Weight gain, [ ]  Loss of appetite, [ ]  Fever, [ ]  chills  Neurologic: Arly.Keller ] Dizziness, [ ]  Blackouts, Arly.Keller ] Headaches, [ ]  Seizure Arly.Keller ] Stroke in 2013, [ ]  "Mini stroke", [ ]  Slurred speech, [ ]  Temporary blindness;  [x ]weakness, right arm since stroke  Ear/Nose/Throat: [ ]  Change in hearing, [ ]  Nose bleeds, [ ]  Hoarseness  Vascular:[ ]  Pain in legs with walking, [ ]  Pain in feet while lying flat , [ ]   Non-healing ulcer, [ ]  Blood clot in vein,    Pulmonary: Arly.Keller ] Home oxygen at  night, [ ]   Productive cough, [ ]  Bronchitis, [ ]  Coughing up blood,  [ ]  Asthma, [ ]  Wheezing  Musculoskeletal:  [ ]  Arthritis, Arly.Keller ] Joint pain, Arly.Keller ] low back pain  Cardiac: [ ]   Chest pain, [ ]  Shortness of breath when lying flat, Arly.Keller ] Shortness of breath with exertion, [ ]  Palpitations, [ ]  Heart murmur, [ ]   Atrial fibrillation  Hematologic:[X ] Easy Bruising, [ ]  Anemia; [ ]  Hepatitis  Psychiatric: [ ]   Depression, [ ]  Anxiety.Positive for schizophrenia, treated.  Gastrointestinal: [ ]  Black stool, [ ]  Blood in stool, [ ]  Peptic ulcer disease,  [ ]  Gastroesophageal Reflux, [ ]  Trouble swallowing, [ ]  Diarrhea, [ ]  Constipation  Urinary: [ ]  chronic Kidney disease, [ ]  on HD, [ ]  Burning with urination, [ ]  Frequent urination, [ ]  Difficulty urinating; Positive for urinary urgency.  Skin: [ ]  Rashes, [ ]  Wounds    Physical Examination  Filed Vitals:   06/26/13 1508  BP: 142/74  Pulse: 74  Resp:    Filed Weights   06/26/13 1459  Weight: 200 lb (90.719 kg)   Body mass index is 35.44 kg/(m^2).  General: WDWN female in NAD GAIT: slow, halting, using wheelchair in the office to get from the exam room to check out area. Eyes: PERRLA Pulmonary:  CTAB, Negative  Rales, Negative rhonchi, & Negative wheezing.  Cardiac: regular Rhythm ,  Negative Murmurs, bilateral pretibial pitting edema, 1+.  VASCULAR EXAM Carotid Bruits Left Right   Negative Negative                                                                                                                                LE Pulses LEFT RIGHT       FEMORAL  unable to palpate sitting unable to palpate sitting        POPLITEAL  not palpable   not palpable       POSTERIOR TIBIAL  not palpable   not palpable        DORSALIS PEDIS      ANTERIOR TIBIAL  palpable    palpable      Gastrointestinal: soft, nontender, BS WNL, no r/g,  negative masses.  Musculoskeletal: Negative muscle atrophy/wasting. M/S 5/5 left UE,  legs and RUE are 3/5, Extremities without ischemic changes.     Neurologic: A&O X 3; Appropriate Affect ; SENSATION ;normal; MOTOR FUNCTION: no muscle wasting or atrophy Speech is slow but fluent. CN 2-12 intact, Pain and light touch intact in extremities, Motor exam as listed above.   Non-Invasive Vascular Imaging CAROTID DUPLEX 06/26/2013  Unable to perform today since patient's insurance will not cover until November, 2014.  Previous angiogram: Yes, CT ANGIOGRAPHY HEAD AND NECK with contrast (07/07/12)  IMPRESSION:  NECK: 1. Left ICA occluded at its origin with no reconstituted flow in  the neck.  2. High-grade stenosis of the right ICA at the level of the distal  bulb, 75% stenosis with respect the distal vessel.  3. High-grade stenosis of the proximal right common carotid artery  estimated at 65-70%.  4. Dominant left vertebral artery with mild if any stenosis at its  origin. Diminutive right vertebral artery throughout the neck.  5. See intracranial findings below.  6. Chronic ununited C1 ring fracture.   HEAD: 1. Thrombosed left ICA siphon. Some reconstituted flow in the  left supraclinoid ICA, such that the left ICA terminus, left MCA,  and left ACA origins remain patent.  2. Patent but attenuated left MCA major branches compared to those  on the right.  3. Symmetric ACA flow.  4. Distal ACA stenosis. Right MCA branches within normal limits.  5. Posterior circulation mild atherosclerosis with no major branch  occlusion.  6. Acute left MCA infarcts seen earlier are not well visualized by  CT. No mass effect or hemorrhage.    Assessment: Sherri Hays is a 62 y.o. female who presents with symptomatic but unchanged symptoms since 2013 CVA prior to her right CEA 3 months later that same year. Her insurance will not cover an [place] today, will advise her to return in 3 months which will be 1 year from her right carotid endarterectomy.  Plan: Follow-up in 3 months with Carotid  Duplex scan.   I discussed in depth with the patient the nature of atherosclerosis, and emphasized the importance of maximal medical management including strict control of blood pressure, blood glucose, and lipid levels, obtaining regular exercise, and cessation of smoking.  The patient is aware that without maximal medical management the underlying atherosclerotic disease process will progress, limiting the benefit of any interventions.  Patient was counseled about smoking cessation. Pt was given information regarding stroke symptoms and prevention. Thank you for allowing [place] to participate in this patient's care.  Charisse March, RN, MSN, FNP-C [facility] [place] Office: [phone]  Clinic Physician: Hart Rochester  06/26/2013 3:22 PM  VASCULAR QUALITY INITIATIVE FOLLOW UP DATA:  Current smoker: Arly.Keller  ] yes  [  ] no  Living status: [  ]  Home  [ X ] Nursing home  [  ] Homeless    MEDS:  ASA [ X ] yes  [  ] no- [  ] medical reason  [  ] non compliant  STATIN  Arly.Keller  ] yes  [  ] no- [  ] medical reason  [  ] non compliant  Beta blocker [  ] yes  [ X ] no- [ X ] medical reason  [  ] non compliant  ACE inhibitor [  ] yes  Arly.Keller  ] no- [  ] medical reason  [  ] non compliant  P2Y12 Antagonist [  ] none  [ X ] clopidogrel-Plavix  [  ] ticlopidine-Ticlid   [  ] prasugrel-Effient  [  ] ticagrelor- Brilinta    Anticoagulant Arly.Keller  ] None  [  ] warfarin  [  ] rivaroxaban-Xarelto [  ] dabigatran- Pradaxa  Neurologic event since D/C:  Arly.Keller  ] no  [  ] yes: [  ] eye event  [  ] cortical event  [  ] VB event  [  ] non specific event  [  ] right  [  ] left  [  ] TIA  [  ] stroke  Date:   Modified Rankin Score: 3   Agree with above assessment Continued carotid followup in the future as outlined

## 2013-09-18 ENCOUNTER — Encounter: Payer: Self-pay | Admitting: Vascular Surgery

## 2013-09-19 ENCOUNTER — Ambulatory Visit (HOSPITAL_COMMUNITY)
Admission: RE | Admit: 2013-09-19 | Discharge: 2013-09-19 | Disposition: A | Discharge status: Home / Self Care | Payer: Medicaid Other | Source: Ambulatory Visit | Attending: Family | Admitting: Family

## 2013-09-19 ENCOUNTER — Encounter: Payer: Self-pay | Admitting: Family

## 2013-09-19 ENCOUNTER — Ambulatory Visit (INDEPENDENT_AMBULATORY_CARE_PROVIDER_SITE_OTHER): Payer: Medicaid Other | Admitting: Family

## 2013-09-19 DIAGNOSIS — I739 Peripheral vascular disease, unspecified: Secondary | ICD-10-CM

## 2013-09-19 DIAGNOSIS — I6529 Occlusion and stenosis of unspecified carotid artery: Secondary | ICD-10-CM | POA: Insufficient documentation

## 2013-09-19 DIAGNOSIS — Z48812 Encounter for surgical aftercare following surgery on the circulatory system: Secondary | ICD-10-CM

## 2013-09-19 NOTE — Progress Notes (Signed)
Established Carotid Patient  History of Present Illness  Sherri Hays is a 62 y.o. female who had a right CEA on 09/21/2012 by Dr. Hart Rochester. 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 She has chronic right arm weakness which has not changed since her surgery. She states she is swallowing well and has no hoarseness. She is in a nursing facility and is on nasal oxygen at night. Apparently her ins. will cover a yearly carotid US.  Patient states she walks "a good ways" before her left hip starts to hurt, this is relieved by sitting.  Has what sounds like neuropathy symptoms in her feet at times (is diabetic), with numbness, tingling, and burning intermittently. She returns today for carotid Duplex since her ins. would not cover 3 months ago. Her caregiver with her denies any further TIA or stroke symptoms in the last 3 months. Patient states pain in both calves with standing, postpones walking due to this, walks with walker, no LE angiogram nor Duplex on file; denies non-healing wounds   The patient's previous neurologic deficits are Unchanged.   Caregiver denies New Medical or Surgical History.  Pt Diabetic: Yes, pt. States she was told that her sugar is in good control Pt smoker: smoker  (1/2 ppd x 30 yrs); states she has tried Chantix and states it did not decrease her urge to smoke.   Past Medical History  Diagnosis Date  . Dysphagia   . TIA (transient ischemic attack)   . Genital herpes   . DM neuropathies   . Anemia   . Arthritis   . Asthma   . Cancer     lt breast  . Schizophrenia, simple   . Diabetes mellitus   . Pneumonia     hx of  . Sleep apnea     "wears cpap, unsure of settings"  . Stroke     2011  . Neuromuscular disorder     diabetic neuropathy  . COPD (chronic obstructive pulmonary disease)   . Hypertension     physician home visits, Dr. Redmond School [phone]  . Hyperlipidemia   . DJD (degenerative joint disease)      Social History History  Substance Use Topics  . Smoking status: Current Every Day Smoker -- 0.50 packs/day    Types: Cigarettes  . Smokeless tobacco: Never Used  . Alcohol Use: No    Family History History reviewed. No pertinent family history.  Surgical History Past Surgical History  Procedure Laterality Date  . Cholecystectomy    . Cesarean section    . Tonsillectomy    . Endarterectomy  09/21/2012    Procedure: ENDARTERECTOMY CAROTID;  Surgeon: Pryor Ochoa, MD;  Location: [facility] OR;  Service: Vascular;  Laterality: Right;  . Patch angioplasty  09/21/2012    Procedure: PATCH ANGIOPLASTY;  Surgeon: Pryor Ochoa, MD;  Location: [facility] OR;  Service: Vascular;  Laterality: Right;  with dacron patch angioplasty  . Carotid endarterectomy      Allergies  Allergen Reactions  . Penicillins Other (See Comments)    unknown    Current Outpatient Prescriptions  Medication Sig Dispense Refill  . albuterol (PROVENTIL HFA;VENTOLIN HFA) 108 (90 BASE) MCG/ACT inhaler Inhale 2 puffs into the lungs every 6 (six) hours as needed. For dyspnea  1 Inhaler  11  . amLODipine (NORVASC) 10 MG tablet Take 10 mg by mouth daily.      Marland Kitchen atorvastatin (LIPITOR) 10 MG tablet  Take 1 tablet (10 mg total) by mouth at bedtime. For cholesterol.      . diclofenac (VOLTAREN) 25 MG EC tablet Take 25 mg by mouth 2 (two) times daily.      . fluPHENAZine (PROLIXIN) 5 MG tablet Take 10 mg by mouth at bedtime.      . Fluticasone-Salmeterol (ADVAIR DISKUS) 250-50 MCG/DOSE AEPB Inhale 1 puff into the lungs 2 (two) times daily. For COPD  60 each  11  . gabapentin (NEURONTIN) 300 MG capsule Take 300 mg by mouth 3 (three) times daily. For anxiety and depression.       . haloperidol decanoate (HALDOL DECANOATE) 100 MG/ML injection Inject 75 mg into the muscle every 28 (twenty-eight) days.      . hydrOXYzine (ATARAX/VISTARIL) 25 MG tablet Take 25 mg by mouth every 4 (four) hours as needed. For anxiety      . insulin  glargine (LANTUS) 100 UNIT/ML injection Inject 30 Units into the skin at bedtime. For glycemic control.  10 mL    . Maltodextrin-Xanthan Gum (RESOURCE THICKENUP CLEAR) POWD usem in liquids as needed      . metFORMIN (GLUCOPHAGE) 500 MG tablet Take 1 tablet (500 mg total) by mouth 2 (two) times daily with a meal. For glycemic control.      Marland Kitchen oxybutynin (DITROPAN-XL) 10 MG 24 hr tablet Take 10 mg by mouth daily.      Marland Kitchen oxyCODONE-acetaminophen (PERCOCET/ROXICET) 5-325 MG per tablet Take 1-2 tablets by mouth every 4 (four) hours as needed.  20 tablet  0  . traZODone (DESYREL) 100 MG tablet Take 100 mg by mouth at bedtime.      . trihexyphenidyl (ARTANE) 2 MG tablet Take 1 mg by mouth at bedtime.      . hydrALAZINE (APRESOLINE) 50 MG tablet Take 1 tablet (50 mg total) by mouth 3 (three) times daily. For elevated blood pressure.  90 tablet  0  . sertraline (ZOLOFT) 100 MG tablet Take 2 tablets (200 mg total) by mouth daily. For depression and anxiety.  60 tablet  0  . trihexyphenidyl (ARTANE) 2 MG tablet Take 0.5 tablets (1 mg total) by mouth at bedtime. For EPS.  15 tablet  0   No current facility-administered medications for this visit.    Review of Systems : [x]  Positive   [ ]  Denies  General:[ ]  Weight loss,  [ ]  Weight gain, [ ]  Loss of appetite, [ ]  Fever, [ ]  chills  Neurologic: [ ]  Dizziness, [ ]  Blackouts, [ ]  Headaches, [ ]  Seizure [ ]  Stroke, [ ]  "Mini stroke", [ ]  Slurred speech, [ ]  Temporary blindness;  [ ] weakness,  Ear/Nose/Throat: [ ]  Change in hearing, [ ]  Nose bleeds, [ ]  Hoarseness  Vascular:[ ]  Pain in legs with walking, [ ]  Pain in feet while lying flat , [ ]   Non-healing ulcer, [ ]  Blood clot in vein,    Pulmonary: [ ]  Home oxygen, [ ]   Productive cough, [ ]  Bronchitis, [ ]  Coughing up blood,  [ ]  Asthma, [ ]  Wheezing  Musculoskeletal:  [ ]  Arthritis, [ ]  Joint pain, [ ]  low back pain  Cardiac: [ ]  Chest pain, [ ]  Shortness of breath when lying flat, [ ]  Shortness of  breath with exertion, [ ]  Palpitations, [ ]  Heart murmur, [ ]   Atrial fibrillation  Hematologic:[ ]  Easy Bruising, [ ]  Anemia; [ ]  Hepatitis  Psychiatric: [ ]   Depression, [ ]  Anxiety   Gastrointestinal: [ ]  Black  stool, [ ]  Blood in stool, [ ]  Peptic ulcer disease,  [ ]  Gastroesophageal Reflux, [ ]  Trouble swallowing, [ ]  Diarrhea, [ ]  Constipation  Urinary: [ ]  chronic Kidney disease, [ ]  on HD, [ ]  Burning with urination, [ ]  Frequent urination, [ ]  Difficulty urinating;   Skin: [ ]  Rashes, [ ]  Wounds    Physical Examination  Filed Vitals:   09/19/13 1224  BP: 100/66  Pulse: 80  Resp: 16   Filed Weights   09/19/13 1224  Weight: 223 lb (101.152 kg)   Body mass index is 39.51 kg/(m^2).   General: WDWN female in NAD GAIT: slow and deliberate, using walker. Eyes: Pupils = Pulmonary:  CTAB, Negative  Rales, Negative rhonchi, & Negative wheezing.  Cardiac: regular Rhythm ,  Negative Murmurs.  Cardiac: regular Rhythm , Negative Murmurs, bilateral pretibial pitting edema, 1+.  VASCULAR EXAM  Carotid Bruits  Left  Right    Negative  Negative    LE Pulses  LEFT  RIGHT   POPLITEAL  not palpable  not palpable   POSTERIOR TIBIAL  not palpable  not palpable   DORSALIS PEDIS  ANTERIOR TIBIAL  palpable  palpable    Gastrointestinal: soft, nontender, BS WNL, no r/g, negative masses.   Musculoskeletal: Negative muscle atrophy/wasting. M/S 5/5 left UE, legs and RUE are 3/5, Extremities without ischemic changes.   Neurologic: A&O X 3; Appropriate Affect ; SENSATION ;normal; MOTOR FUNCTION: no muscle wasting or atrophy  Speech is slow but fluent.  CN 2-12 intact, Pain and light touch intact in extremities, Motor exam as listed above.     Non-Invasive Vascular Imaging CAROTID DUPLEX 09/19/2013   Right ICA: patent CEA site. Left ICA: 0ccluded.   Previous angiogram: Yes, CT ANGIOGRAPHY HEAD AND NECK with contrast (07/07/12)  IMPRESSION:  NECK:  1. Left ICA occluded at its  origin with no reconstituted flow in  the neck.  2. High-grade stenosis of the right ICA at the level of the distal  bulb, 75% stenosis with respect the distal vessel.  3. High-grade stenosis of the proximal right common carotid artery  estimated at 65-70%.  4. Dominant left vertebral artery with mild if any stenosis at its  origin. Diminutive right vertebral artery throughout the neck.  5. See intracranial findings below.  6. Chronic ununited C1 ring fracture.  HEAD:  1. Thrombosed left ICA siphon. Some reconstituted flow in the  left supraclinoid ICA, such that the left ICA terminus, left MCA,  and left ACA origins remain patent.  2. Patent but attenuated left MCA major branches compared to those  on the right.  3. Symmetric ACA flow.  4. Distal ACA stenosis. Right MCA branches within normal limits.  5. Posterior circulation mild atherosclerosis with no major branch  occlusion.  6. Acute left MCA infarcts seen earlier are not well visualized by  CT. No mass effect or hemorrhage.    Assessment:Gilberte L Salado is a 62 y.o. female who presents with symptomatic but unchanged symptoms since 2013 CVA prior to her right CEA 3 months later that same year. She is status post  right CEA on 09/21/2012 by Dr. Hart Rochester. 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 She has chronic right arm weakness which has not changed since her surgery.  The  ICA stenosis is  Improved from previous exam since CEA. Has claudication symptoms in calves with standing, will check ABI's in 6 months.  Plan: Follow-up in 1 year with Carotid Duplex scan, and ABI's in 6 months. Recommend starting daily 81 mg ASA unless she has a contraindication for this, will defer to her PCP for this.  I discussed in depth with the patient the nature of atherosclerosis, and emphasized the importance of maximal medical management including strict control of blood pressure, blood glucose, and  lipid levels, obtaining regular exercise, and cessation of smoking.  The patient is aware that without maximal medical management the underlying atherosclerotic disease process will progress, limiting the benefit of any interventions. The patient was given information about stroke prevention and what symptoms should prompt the patient to seek immediate medical care.  The patient was counseled re smoking cessation, has tried Chantix to no effect, consider vapor only with no nicotine cartridges for e-cigarettes.   Thank you for allowing [place] to participate in this patient's care.  Charisse March, RN, MSN, FNP-C [facility] [place] Office: [phone]  Clinic Physician: Edilia Bo  09/19/2013 12:48 PM

## 2013-09-19 NOTE — Patient Instructions (Addendum)
Smoking Cessation Quitting smoking is important to your health and has many advantages. However, it is not always easy to quit since nicotine is a very addictive drug. Often times, people try 3 times or more before being able to quit. This document explains the best ways for you to prepare to quit smoking. Quitting takes hard work and a lot of effort, but you can do it. ADVANTAGES OF QUITTING SMOKING  You will live longer, feel better, and live better.  Your body will feel the impact of quitting smoking almost immediately.  Within 20 minutes, blood pressure decreases. Your pulse returns to its normal level.  After 8 hours, carbon monoxide levels in the blood return to normal. Your oxygen level increases.  After 24 hours, the chance of having a heart attack starts to decrease. Your breath, hair, and body stop smelling like smoke.  After 48 hours, damaged nerve endings begin to recover. Your sense of taste and smell improve.  After 72 hours, the body is virtually free of nicotine. Your bronchial tubes relax and breathing becomes easier.  After 2 to 12 weeks, lungs can hold more air. Exercise becomes easier and circulation improves.  The risk of having a heart attack, stroke, cancer, or lung disease is greatly reduced.  After 1 year, the risk of coronary heart disease is cut in half.  After 5 years, the risk of stroke falls to the same as a nonsmoker.  After 10 years, the risk of lung cancer is cut in half and the risk of other cancers decreases significantly.  After 15 years, the risk of coronary heart disease drops, usually to the level of a nonsmoker.  If you are pregnant, quitting smoking will improve your chances of having a healthy baby.  The people you live with, especially any children, will be healthier.  You will have extra money to spend on things other than cigarettes. QUESTIONS TO THINK ABOUT BEFORE ATTEMPTING TO QUIT You may want to talk about your answers with your  caregiver.  Why do you want to quit?  If you tried to quit in the past, what helped and what did not?  What will be the most difficult situations for you after you quit? How will you plan to handle them?  Who can help you through the tough times? Your family? Friends? A caregiver?  What pleasures do you get from smoking? What ways can you still get pleasure if you quit? Here are some questions to ask your caregiver:  How can you help me to be successful at quitting?  What medicine do you think would be best for me and how should I take it?  What should I do if I need more help?  What is smoking withdrawal like? How can I get information on withdrawal? GET READY  Set a quit date.  Change your environment by getting rid of all cigarettes, ashtrays, matches, and lighters in your home, car, or work. Do not let people smoke in your home.  Review your past attempts to quit. Think about what worked and what did not. GET SUPPORT AND ENCOURAGEMENT You have a better chance of being successful if you have help. You can get support in many ways.  Tell your family, friends, and co-workers that you are going to quit and need their support. Ask them not to smoke around you.  Get individual, group, or telephone counseling and support. Programs are available at local hospitals and health centers. Call your local health department for   information about programs in your area.  Spiritual beliefs and practices may help some smokers quit.  Download a "quit meter" on your computer to keep track of quit statistics, such as how long you have gone without smoking, cigarettes not smoked, and money saved.  Get a self-help book about quitting smoking and staying off of tobacco. LEARN NEW SKILLS AND BEHAVIORS  Distract yourself from urges to smoke. Talk to someone, go for a walk, or occupy your time with a task.  Change your normal routine. Take a different route to work. Drink tea instead of coffee.  Eat breakfast in a different place.  Reduce your stress. Take a hot bath, exercise, or read a book.  Plan something enjoyable to do every day. Reward yourself for not smoking.  Explore interactive web-based programs that specialize in helping you quit. GET MEDICINE AND USE IT CORRECTLY Medicines can help you stop smoking and decrease the urge to smoke. Combining medicine with the above behavioral methods and support can greatly increase your chances of successfully quitting smoking.  Nicotine replacement therapy helps deliver nicotine to your body without the negative effects and risks of smoking. Nicotine replacement therapy includes nicotine gum, lozenges, inhalers, nasal sprays, and skin patches. Some may be available over-the-counter and others require a prescription.  Antidepressant medicine helps people abstain from smoking, but how this works is unknown. This medicine is available by prescription.  Nicotinic receptor partial agonist medicine simulates the effect of nicotine in your brain. This medicine is available by prescription. Ask your caregiver for advice about which medicines to use and how to use them based on your health history. Your caregiver will tell you what side effects to look out for if you choose to be on a medicine or therapy. Carefully read the information on the package. Do not use any other product containing nicotine while using a nicotine replacement product.  RELAPSE OR DIFFICULT SITUATIONS Most relapses occur within the first 3 months after quitting. Do not be discouraged if you start smoking again. Remember, most people try several times before finally quitting. You may have symptoms of withdrawal because your body is used to nicotine. You may crave cigarettes, be irritable, feel very hungry, cough often, get headaches, or have difficulty concentrating. The withdrawal symptoms are only temporary. They are strongest when you first quit, but they will go away within  10 14 days. To reduce the chances of relapse, try to:  Avoid drinking alcohol. Drinking lowers your chances of successfully quitting.  Reduce the amount of caffeine you consume. Once you quit smoking, the amount of caffeine in your body increases and can give you symptoms, such as a rapid heartbeat, sweating, and anxiety.  Avoid smokers because they can make you want to smoke.  Do not let weight gain distract you. Many smokers will gain weight when they quit, usually less than 10 pounds. Eat a healthy diet and stay active. You can always lose the weight gained after you quit.  Find ways to improve your mood other than smoking. FOR MORE INFORMATION  www.smokefree.gov  Document Released: 10/12/2001 Document Revised: 04/18/2012 Document Reviewed: 01/27/2012 [facility] Patient Information 2014 [place], [place].  Stroke Prevention Some medical conditions and behaviors are associated with an increased chance of having a stroke. You may prevent a stroke by making healthy choices and managing medical conditions. Reduce your risk of having a stroke by:  Staying physically active. Get at least 30 minutes of activity on most or all days.  Not smoking.  It may also be helpful to avoid exposure to secondhand smoke.  Limiting alcohol use. Moderate alcohol use is considered to be:  No more than 2 drinks per day for men.  No more than 1 drink per day for nonpregnant women.  Eating healthy foods.  Include 5 or more servings of fruits and vegetables a day.  Certain diets may be prescribed to address high blood pressure, high cholesterol, diabetes, or obesity.  Managing your cholesterol levels.  A low-saturated fat, low-trans fat, low-cholesterol, and high-fiber diet may control cholesterol levels.  Take any prescribed medicines to control cholesterol as directed by your caregiver.  Managing your diabetes.  A controlled-carbohydrate, controlled-sugar diet is recommended to manage  diabetes.  Take any prescribed medicines to control diabetes as directed by your caregiver.  Controlling your high blood pressure (hypertension).  A low-salt (sodium), low-saturated fat, low-trans fat, and low-cholesterol diet is recommended to manage high blood pressure.  Take any prescribed medicines to control hypertension as directed by your caregiver.  Maintaining a healthy weight.  A reduced-calorie, low-sodium, low-saturated fat, low-trans fat, low-cholesterol diet is recommended to manage weight.  Stopping drug abuse.  Avoiding birth control pills.  Talk to your caregiver about the risks of taking birth control pills if you are over 52 years old, smoke, get migraines, or have ever had a blood clot.  Getting evaluated for sleep disorders (sleep apnea).  Talk to your caregiver about getting a sleep evaluation if you snore a lot or have excessive sleepiness.  Taking medicines as directed by your caregiver.  For some people, aspirin or blood thinners (anticoagulants) are helpful in reducing the risk of forming abnormal blood clots that can lead to stroke. If you have the irregular heart rhythm of atrial fibrillation, you should be on a blood thinner unless there is a good reason you cannot take them.  Understand all your medicine instructions. SEEK IMMEDIATE MEDICAL CARE IF:   You have sudden weakness or numbness of the face, arm, or leg, especially on one side of the body.  You have sudden confusion.  You have trouble speaking (aphasia) or understanding.  You have sudden trouble seeing in one or both eyes.  You have sudden trouble walking.  You have dizziness.  You have a loss of balance or coordination.  You have a sudden, severe headache with no known cause.  You have new chest pain or an irregular heartbeat. Any of these symptoms may represent a serious problem that is an emergency. Do not wait to see if the symptoms will go away. Get medical help right away.  Call your local emergency services (911 in [place] Do not drive yourself to the hospital. Document Released: 11/25/2004 Document Revised: 01/10/2012 Document Reviewed: 04/20/2013 [facility] Patient Information 2014 [place], [place].   Peripheral Vascular Disease Peripheral Vascular Disease (PVD), also called Peripheral Arterial Disease (PAD), is a circulation problem caused by cholesterol (atherosclerotic plaque) deposits in the arteries. PVD commonly occurs in the lower extremities (legs) but it can occur in other areas of the body, such as your arms. The cholesterol buildup in the arteries reduces blood flow which can cause pain and other serious problems. The presence of PVD can place a person at risk for Coronary Artery Disease (CAD).  CAUSES  Causes of PVD can be many. It is usually associated with more than one risk factor such as:   High Cholesterol.  Smoking.  Diabetes.  Lack of exercise or inactivity.  High blood pressure (hypertension).  Obesity.  Family history. SYMPTOMS   When the lower extremities are affected, patients with PVD may experience:  Leg pain with exertion or physical activity. This is called INTERMITTENT CLAUDICATION. This may present as cramping or numbness with physical activity. The location of the pain is associated with the level of blockage. For example, blockage at the abdominal level (distal abdominal aorta) may result in buttock or hip pain. Lower leg arterial blockage may result in calf pain.  As PVD becomes more severe, pain can develop with less physical activity.  In people with severe PVD, leg pain may occur at rest.  Other PVD signs and symptoms:  Leg numbness or weakness.  Coldness in the affected leg or foot, especially when compared to the other leg.  A change in leg color.  Patients with significant PVD are more prone to ulcers or sores on toes, feet or legs. These may take longer to heal or may reoccur. The ulcers or sores can become  infected.  If signs and symptoms of PVD are ignored, gangrene may occur. This can result in the loss of toes or loss of an entire limb.  Not all leg pain is related to PVD. Other medical conditions can cause leg pain such as:  Blood clots (embolism) or Deep Vein Thrombosis.  Inflammation of the blood vessels (vasculitis).  Spinal stenosis. DIAGNOSIS  Diagnosis of PVD can involve several different types of tests. These can include:  Pulse Volume Recording Method (PVR). This test is simple, painless and does not involve the use of X-rays. PVR involves measuring and comparing the blood pressure in the arms and legs. An ABI (Ankle-Brachial Index) is calculated. The normal ratio of blood pressures is 1. As this number becomes smaller, it indicates more severe disease.  < 0.95  indicates significant narrowing in one or more leg vessels.  <0.8 there will usually be pain in the foot, leg or buttock with exercise.  <0.4 will usually have pain in the legs at rest.  <0.25  usually indicates limb threatening PVD.  Doppler detection of pulses in the legs. This test is painless and checks to see if you have a pulses in your legs/feet.  A dye or contrast material (a substance that highlights the blood vessels so they show up on x-ray) may be given to help your caregiver better see the arteries for the following tests. The dye is eliminated from your body by the kidney's. Your caregiver may order blood work to check your kidney function and other laboratory values before the following tests are performed:  Magnetic Resonance Angiography (MRA). An MRA is a picture study of the blood vessels and arteries. The MRA machine uses a large magnet to produce images of the blood vessels.  Computed Tomography Angiography (CTA). A CTA is a specialized x-ray that looks at how the blood flows in your blood vessels. An IV may be inserted into your arm so contrast dye can be injected.  Angiogram. Is a procedure that  uses x-rays to look at your blood vessels. This procedure is minimally invasive, meaning a small incision (cut) is made in your groin. A small tube (catheter) is then inserted into the artery of your groin. The catheter is guided to the blood vessel or artery your caregiver wants to examine. Contrast dye is injected into the catheter. X-rays are then taken of the blood vessel or artery. After the images are obtained, the catheter is taken out. TREATMENT  Treatment of PVD involves many interventions which may include:  Lifestyle changes:  Quitting smoking.  Exercise.  Following a low fat, low cholesterol diet.  Control of diabetes.  Foot care is very important to the PVD patient. Good foot care can help prevent infection.  Medication:  Cholesterol-lowering medicine.  Blood pressure medicine.  Anti-platelet drugs.  Certain medicines may reduce symptoms of Intermittent Claudication.  Interventional/Surgical options:  Angioplasty. An Angioplasty is a procedure that inflates a balloon in the blocked artery. This opens the blocked artery to improve blood flow.  Stent Implant. A wire mesh tube (stent) is placed in the artery. The stent expands and stays in place, allowing the artery to remain open.  Peripheral Bypass Surgery. This is a surgical procedure that reroutes the blood around a blocked artery to help improve blood flow. This type of procedure may be performed if Angioplasty or stent implants are not an option. SEEK IMMEDIATE MEDICAL CARE IF:   You develop pain or numbness in your arms or legs.  Your arm or leg turns cold, becomes blue in color.  You develop redness, warmth, swelling and pain in your arms or legs. MAKE SURE YOU:   Understand these instructions.  Will watch your condition.  Will get help right away if you are not doing well or get worse. Document Released: 11/25/2004 Document Revised: 01/10/2012 Document Reviewed: 10/22/2008 [facility] Patient  Information 2014 [place], [place].

## 2013-09-26 ENCOUNTER — Ambulatory Visit: Payer: Self-pay | Admitting: Family

## 2013-09-26 ENCOUNTER — Other Ambulatory Visit (HOSPITAL_COMMUNITY): Payer: Self-pay

## 2013-09-26 NOTE — Addendum Note (Signed)
Addended by: Melodye Ped C on: 09/26/2013 11:12 AM   Modules accepted: Orders

## 2013-10-01 NOTE — Progress Notes (Signed)
Phone call to the nursing facility that pt. Resides; pt. Resides at [facility]; spoke with Regan Rakers, NP.  Advised that per S. Nickel, NP, pt. is recommended to start ASA 81 mg, po, qd, if no contraindication noted by her PCP.   Gordy Clement, NP, stated that she would initiate the order for ASA 81 mg qd; stated she felt pt. would be able to take it, without any contraindication.

## 2014-03-18 ENCOUNTER — Encounter: Payer: Self-pay | Admitting: Family

## 2014-03-19 ENCOUNTER — Ambulatory Visit (INDEPENDENT_AMBULATORY_CARE_PROVIDER_SITE_OTHER): Payer: Medicaid Other | Admitting: Family

## 2014-03-19 ENCOUNTER — Encounter: Payer: Self-pay | Admitting: Family

## 2014-03-19 ENCOUNTER — Ambulatory Visit (HOSPITAL_COMMUNITY)
Admission: RE | Admit: 2014-03-19 | Discharge: 2014-03-19 | Disposition: A | Discharge status: Home / Self Care | Payer: Medicaid Other | Source: Ambulatory Visit | Attending: Family | Admitting: Family

## 2014-03-19 VITALS — BP 143/66 | HR 78 | Resp 16 | Ht 63.0 in | Wt 223.0 lb

## 2014-03-19 DIAGNOSIS — I739 Peripheral vascular disease, unspecified: Secondary | ICD-10-CM

## 2014-03-19 DIAGNOSIS — E785 Hyperlipidemia, unspecified: Secondary | ICD-10-CM | POA: Insufficient documentation

## 2014-03-19 DIAGNOSIS — E119 Type 2 diabetes mellitus without complications: Secondary | ICD-10-CM | POA: Insufficient documentation

## 2014-03-19 DIAGNOSIS — I6529 Occlusion and stenosis of unspecified carotid artery: Secondary | ICD-10-CM

## 2014-03-19 DIAGNOSIS — F17200 Nicotine dependence, unspecified, uncomplicated: Secondary | ICD-10-CM | POA: Insufficient documentation

## 2014-03-19 DIAGNOSIS — I70219 Atherosclerosis of native arteries of extremities with intermittent claudication, unspecified extremity: Secondary | ICD-10-CM | POA: Insufficient documentation

## 2014-03-19 DIAGNOSIS — I10 Essential (primary) hypertension: Secondary | ICD-10-CM | POA: Insufficient documentation

## 2014-03-19 DIAGNOSIS — M79609 Pain in unspecified limb: Secondary | ICD-10-CM

## 2014-03-19 DIAGNOSIS — Z9889 Other specified postprocedural states: Secondary | ICD-10-CM | POA: Insufficient documentation

## 2014-03-19 NOTE — Progress Notes (Signed)
[facility] HISTORY AND PHYSICAL -PAD, carotid artery stenosis  History of Present Illness Sherri Hays is a 63 y.o. female who had a right CEA on 09/21/2012 by Dr. Kellie Simmering. 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 She has chronic right arm weakness which has not changed since her surgery. She states she is swallowing well and has no hoarseness. She is in a nursing facility and is on nasal oxygen at night.  Apparently her ins. will cover a yearly carotid US.  Patient states she walks "a good ways" before her left hip starts to hurt, this is relieved by sitting.  Has what sounds like neuropathy symptoms in her feet at times (is diabetic), with numbness, tingling, and burning intermittently.  She returns today for ABI's. Her caregiver with her denies any further TIA or stroke symptoms in the last 3 months.  Patient states pain in both calves with standing, postpones walking due to this, walks with walker; denies non-healing wounds  The patient's previous neurologic deficits are Unchanged.     March, 2015 she had pt had an injection in her right hip, helped her right hip pain for a while. She takes a daily ASA, a statin, and clopidogrel.   Pt Diabetic: Yes, pt. States she was told that her sugar is in good control  Pt smoker: smoker (1/2 ppd x 30 yrs); states she has tried Chantix and states it did not decrease her urge to smoke.   Past Medical History  Diagnosis Date  . Dysphagia   . TIA (transient ischemic attack)   . Genital herpes   . DM neuropathies   . Anemia   . Arthritis   . Asthma   . Cancer     lt breast  . Schizophrenia, simple   . Diabetes mellitus   . Pneumonia     hx of  . Sleep apnea     "wears cpap, unsure of settings"  . Stroke     2011  . Neuromuscular disorder     diabetic neuropathy  . COPD (chronic obstructive pulmonary disease)   . Hypertension     physician home visits, Dr.  Fredderick Phenix [phone]  . Hyperlipidemia   . DJD (degenerative joint disease)     Social History History  Substance Use Topics  . Smoking status: Current Some Day Smoker -- 0.50 packs/day    Types: Cigarettes  . Smokeless tobacco: Never Used  . Alcohol Use: No    Family History History reviewed. No pertinent family history.  Past Surgical History  Procedure Laterality Date  . Cholecystectomy    . Cesarean section    . Tonsillectomy    . Endarterectomy  09/21/2012    Procedure: ENDARTERECTOMY CAROTID;  Surgeon: Mal Misty, MD;  Location: [facility];  Service: Vascular;  Laterality: Right;  . Patch angioplasty  09/21/2012    Procedure: PATCH ANGIOPLASTY;  Surgeon: Mal Misty, MD;  Location: [facility];  Service: Vascular;  Laterality: Right;  with dacron patch angioplasty  . Carotid endarterectomy      Allergies  Allergen Reactions  . Penicillins Other (See Comments)    unknown    Current Outpatient Prescriptions  Medication Sig Dispense Refill  . acetaminophen (TYLENOL) 325 MG tablet Take 650 mg by mouth every 6 (six) hours as needed.      Marland Kitchen albuterol (PROVENTIL HFA;VENTOLIN HFA) 108 (90 BASE) MCG/ACT inhaler Inhale 2 puffs into  the lungs every 6 (six) hours as needed. For dyspnea  1 Inhaler  11  . amLODipine (NORVASC) 10 MG tablet Take 10 mg by mouth daily.      Marland Kitchen aspirin 81 MG tablet Take 81 mg by mouth daily.      Marland Kitchen atorvastatin (LIPITOR) 10 MG tablet Take 1 tablet (10 mg total) by mouth at bedtime. For cholesterol.      . clopidogrel (PLAVIX) 75 MG tablet Take 75 mg by mouth daily with breakfast.      . diclofenac (VOLTAREN) 25 MG EC tablet Take 25 mg by mouth 2 (two) times daily.      . ferrous fumarate (HEMOCYTE - 106 MG FE) 325 (106 FE) MG TABS tablet Take 1 tablet by mouth.      . fluPHENAZine (PROLIXIN) 5 MG tablet Take 10 mg by mouth at bedtime.      . Fluticasone-Salmeterol (ADVAIR DISKUS) 250-50 MCG/DOSE AEPB Inhale 1 puff into the lungs 2 (two) times daily. For  COPD  60 each  11  . gabapentin (NEURONTIN) 300 MG capsule Take 300 mg by mouth 3 (three) times daily. For anxiety and depression.       . haloperidol decanoate (HALDOL DECANOATE) 100 MG/ML injection Inject 75 mg into the muscle every 28 (twenty-eight) days.      . hydrOXYzine (ATARAX/VISTARIL) 25 MG tablet Take 25 mg by mouth every 4 (four) hours as needed. For anxiety      . insulin detemir (LEVEMIR) 100 UNIT/ML injection Inject into the skin at bedtime.      . insulin glargine (LANTUS) 100 UNIT/ML injection Inject 30 Units into the skin at bedtime. For glycemic control.  10 mL    . Maltodextrin-Xanthan Gum (RESOURCE THICKENUP CLEAR) POWD usem in liquids as needed      . metFORMIN (GLUCOPHAGE) 500 MG tablet Take 1 tablet (500 mg total) by mouth 2 (two) times daily with a meal. For glycemic control.      . Multiple Vitamins-Minerals (THERA M PLUS PO) Take by mouth.      . oxyCODONE-acetaminophen (PERCOCET/ROXICET) 5-325 MG per tablet Take 1-2 tablets by mouth every 4 (four) hours as needed.  20 tablet  0  . pantoprazole (PROTONIX) 40 MG tablet Take 40 mg by mouth daily.      Marland Kitchen Propylene Glycol (SYSTANE BALANCE OP) Apply to eye.      . risperiDONE microspheres (RISPERDAL CONSTA) 25 MG injection Inject 25 mg into the muscle every 14 (fourteen) days.      . traZODone (DESYREL) 100 MG tablet Take 100 mg by mouth at bedtime.      . trihexyphenidyl (ARTANE) 2 MG tablet Take 1 mg by mouth at bedtime.      . hydrALAZINE (APRESOLINE) 50 MG tablet Take 1 tablet (50 mg total) by mouth 3 (three) times daily. For elevated blood pressure.  90 tablet  0  . oxybutynin (DITROPAN-XL) 10 MG 24 hr tablet Take 10 mg by mouth daily.      . sertraline (ZOLOFT) 100 MG tablet Take 2 tablets (200 mg total) by mouth daily. For depression and anxiety.  60 tablet  0  . trihexyphenidyl (ARTANE) 2 MG tablet Take 0.5 tablets (1 mg total) by mouth at bedtime. For EPS.  15 tablet  0   No current facility-administered medications  for this visit.    ROS: See HPI for pertinent positives and negatives.   Physical Examination  Filed Vitals:   03/19/14 1121  BP: 143/66  Pulse: 78  Resp: 16  Height: 5\' 3"  (1.6 m)  Weight: 223 lb (101.152 kg)  SpO2: 99%  Body mass index is 39.51 kg/(m^2).  General: WDWN obese female in NAD  GAIT: in wheelchair today  Eyes: Pupils =  Pulmonary: diminished air movement in all fields, Negative Rales, Negative rhonchi, & positive wheezing.  Cardiac: regular Rhythm , Negative Murmurs, bilateral pretibial pitting edema, 1+.   VASCULAR EXAM  Carotid Bruits  Left  Right    Negative  Negative    LE Pulses  LEFT  RIGHT   POPLITEAL  not palpable  not palpable   POSTERIOR TIBIAL  not palpable  not palpable   DORSALIS PEDIS  ANTERIOR TIBIAL  Not palpable  Not palpable    Gastrointestinal: soft, nontender, BS WNL, no r/g, negative masses.  Musculoskeletal: Negative muscle atrophy/wasting. M/S 5/5 left UE, legs and RUE are 3/5, Extremities without ischemic changes.  Neurologic: A&O X 3; Appropriate Affect ; SENSATION ;normal; MOTOR FUNCTION: no muscle wasting or atrophy  Speech is slow but fluent.  CN 2-12 intact, Pain and light touch intact in extremities, Motor exam as listed above.    Non-Invasive Vascular Imaging: DATE: 03/19/2014 ABI: RIGHT 0.68, Waveforms: biphasic;  LEFT 0.56, Waveforms: monophasic   Previous angiogram:  CT ANGIOGRAPHY HEAD AND NECK with contrast (07/07/12)  IMPRESSION:  NECK:  1. Left ICA occluded at its origin with no reconstituted flow in  the neck.  2. High-grade stenosis of the right ICA at the level of the distal  bulb, 75% stenosis with respect the distal vessel.  3. High-grade stenosis of the proximal right common carotid artery  estimated at 65-70%.  4. Dominant left vertebral artery with mild if any stenosis at its  origin. Diminutive right vertebral artery throughout the neck.  5. See intracranial findings below.  6. Chronic ununited C1 ring  fracture.  HEAD:  1. Thrombosed left ICA siphon. Some reconstituted flow in the  left supraclinoid ICA, such that the left ICA terminus, left MCA,  and left ACA origins remain patent.  2. Patent but attenuated left MCA major branches compared to those  on the right.  3. Symmetric ACA flow.  4. Distal ACA stenosis. Right MCA branches within normal limits.  5. Posterior circulation mild atherosclerosis with no major branch  occlusion.  6. Acute left MCA infarcts seen earlier are not well visualized by  CT. No mass effect or hemorrhage.    ASSESSMENT: Sherri Hays is a 63 y.o. female who is s/p right CEA on 09/21/2012 by Dr. Kellie Simmering. 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 She has chronic right arm weakness which has not changed since her surgery. Patient states pain in both calves with standing, postpones walking due to this, walks with walker; denies non-healing wounds. 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  PLAN:  Daily chair exercises for legs and arms as discussed; caretaker with pt states that pt is a fall risk. Pt was again counseled re smoking cessation, given pamphlet for smoking cessation class, advised to register. I discussed in depth with the patient the nature of atherosclerosis, and emphasized the importance of maximal medical management including strict control of blood pressure, blood glucose, and lipid levels, obtaining regular exercise, and cessation of smoking.  The patient is  aware that without maximal medical management the underlying atherosclerotic disease process will progress, limiting the benefit of any interventions.  Based on the patient's vascular studies and examination, pt will return to clinic in 6 months for already scheduled carotid Duplex,  will repeat ABI's since she has moderate and severe arterial occlusive disease in her legs.  The patient was given information about PAD including signs, symptoms, treatment, what symptoms should prompt the patient to seek immediate medical care, and risk reduction measures to take.  Clemon Chambers, RN, MSN, FNP-C [facility] Phone: [phone]  Clinic MD: Kellie Simmering  03/19/2014 11:40 AM

## 2014-03-19 NOTE — Patient Instructions (Signed)
Peripheral Vascular Disease Peripheral Vascular Disease (PVD), also called Peripheral Arterial Disease (PAD), is a circulation problem caused by cholesterol (atherosclerotic plaque) deposits in the arteries. PVD commonly occurs in the lower extremities (legs) but it can occur in other areas of the body, such as your arms. The cholesterol buildup in the arteries reduces blood flow which can cause pain and other serious problems. The presence of PVD can place a person at risk for Coronary Artery Disease (CAD).  CAUSES  Causes of PVD can be many. It is usually associated with more than one risk factor such as:   High Cholesterol.  Smoking.  Diabetes.  Lack of exercise or inactivity.  High blood pressure (hypertension).  Obesity.  Family history. SYMPTOMS   When the lower extremities are affected, patients with PVD may experience:  Leg pain with exertion or physical activity. This is called INTERMITTENT CLAUDICATION. This may present as cramping or numbness with physical activity. The location of the pain is associated with the level of blockage. For example, blockage at the abdominal level (distal abdominal aorta) may result in buttock or hip pain. Lower leg arterial blockage may result in calf pain.  As PVD becomes more severe, pain can develop with less physical activity.  In people with severe PVD, leg pain may occur at rest.  Other PVD signs and symptoms:  Leg numbness or weakness.  Coldness in the affected leg or foot, especially when compared to the other leg.  A change in leg color.  Patients with significant PVD are more prone to ulcers or sores on toes, feet or legs. These may take longer to heal or may reoccur. The ulcers or sores can become infected.  If signs and symptoms of PVD are ignored, gangrene may occur. This can result in the loss of toes or loss of an entire limb.  Not all leg pain is related to PVD. Other medical conditions can cause leg pain such  as:  Blood clots (embolism) or Deep Vein Thrombosis.  Inflammation of the blood vessels (vasculitis).  Spinal stenosis. DIAGNOSIS  Diagnosis of PVD can involve several different types of tests. These can include:  Pulse Volume Recording Method (PVR). This test is simple, painless and does not involve the use of X-rays. PVR involves measuring and comparing the blood pressure in the arms and legs. An ABI (Ankle-Brachial Index) is calculated. The normal ratio of blood pressures is 1. As this number becomes smaller, it indicates more severe disease.  < 0.95  indicates significant narrowing in one or more leg vessels.  <0.8 there will usually be pain in the foot, leg or buttock with exercise.  <0.4 will usually have pain in the legs at rest.  <0.25  usually indicates limb threatening PVD.  Doppler detection of pulses in the legs. This test is painless and checks to see if you have a pulses in your legs/feet.  A dye or contrast material (a substance that highlights the blood vessels so they show up on x-ray) may be given to help your caregiver better see the arteries for the following tests. The dye is eliminated from your body by the kidney's. Your caregiver may order blood work to check your kidney function and other laboratory values before the following tests are performed:  Magnetic Resonance Angiography (MRA). An MRA is a picture study of the blood vessels and arteries. The MRA machine uses a large magnet to produce images of the blood vessels.  Computed Tomography Angiography (CTA). A CTA is a   specialized x-ray that looks at how the blood flows in your blood vessels. An IV may be inserted into your arm so contrast dye can be injected.  Angiogram. Is a procedure that uses x-rays to look at your blood vessels. This procedure is minimally invasive, meaning a small incision (cut) is made in your groin. A small tube (catheter) is then inserted into the artery of your groin. The catheter is  guided to the blood vessel or artery your caregiver wants to examine. Contrast dye is injected into the catheter. X-rays are then taken of the blood vessel or artery. After the images are obtained, the catheter is taken out. TREATMENT  Treatment of PVD involves many interventions which may include:  Lifestyle changes:  Quitting smoking.  Exercise.  Following a low fat, low cholesterol diet.  Control of diabetes.  Foot care is very important to the PVD patient. Good foot care can help prevent infection.  Medication:  Cholesterol-lowering medicine.  Blood pressure medicine.  Anti-platelet drugs.  Certain medicines may reduce symptoms of Intermittent Claudication.  Interventional/Surgical options:  Angioplasty. An Angioplasty is a procedure that inflates a balloon in the blocked artery. This opens the blocked artery to improve blood flow.  Stent Implant. A wire mesh tube (stent) is placed in the artery. The stent expands and stays in place, allowing the artery to remain open.  Peripheral Bypass Surgery. This is a surgical procedure that reroutes the blood around a blocked artery to help improve blood flow. This type of procedure may be performed if Angioplasty or stent implants are not an option. SEEK IMMEDIATE MEDICAL CARE IF:   You develop pain or numbness in your arms or legs.  Your arm or leg turns cold, becomes blue in color.  You develop redness, warmth, swelling and pain in your arms or legs. MAKE SURE YOU:   Understand these instructions.  Will watch your condition.  Will get help right away if you are not doing well or get worse. Document Released: 11/25/2004 Document Revised: 01/10/2012 Document Reviewed: 10/22/2008 [employer] Patient Information 2014 [employer].   Stroke Prevention Some medical conditions and behaviors are associated with an increased chance of having a stroke. You may prevent a stroke by making healthy choices and managing medical  conditions. HOW CAN I REDUCE MY RISK OF HAVING A STROKE?   Stay physically active. Get at least 30 minutes of activity on most or all days.  Do not smoke. It may also be helpful to avoid exposure to secondhand smoke.  Limit alcohol use. Moderate alcohol use is considered to be:  No more than 2 drinks per day for men.  No more than 1 drink per day for nonpregnant women.  Eat healthy foods. This involves  Eating 5 or more servings of fruits and vegetables a day.  Following a diet that addresses high blood pressure (hypertension), high cholesterol, diabetes, or obesity.  Manage your cholesterol levels.  A diet low in saturated fat, trans fat, and cholesterol and high in fiber may control cholesterol levels.  Take any prescribed medicines to control cholesterol as directed by your health care provider.  Manage your diabetes.  A controlled-carbohydrate, controlled-sugar diet is recommended to manage diabetes.  Take any prescribed medicines to control diabetes as directed by your health care provider.  Control your hypertension.  A low-salt (sodium), low-saturated fat, low-trans fat, and low-cholesterol diet is recommended to manage hypertension.  Take any prescribed medicines to control hypertension as directed by your health care provider.    a healthy weight.  A reduced-calorie, low-sodium, low-saturated fat, low-trans fat, low-cholesterol diet is recommended to manage weight.  Stop drug abuse.  Avoid taking birth control pills.  Talk to your health care provider about the risks of taking birth control pills if you are over 37 years old, smoke, get migraines, or have ever had a blood clot.  Get evaluated for sleep disorders (sleep apnea).  Talk to your health care provider about getting a sleep evaluation if you snore a lot or have excessive sleepiness.  Take medicines as directed by your health care provider.  For some people, aspirin or blood thinners  (anticoagulants) are helpful in reducing the risk of forming abnormal blood clots that can lead to stroke. If you have the irregular heart rhythm of atrial fibrillation, you should be on a blood thinner unless there is a good reason you cannot take them.  Understand all your medicine instructions.  Make sure that other other conditions (such as anemia or atherosclerosis) are addressed. SEEK IMMEDIATE MEDICAL CARE IF:   You have sudden weakness or numbness of the face, arm, or leg, especially on one side of the body.  Your face or eyelid droops to one side.  You have sudden confusion.  You have trouble speaking (aphasia) or understanding.  You have sudden trouble seeing in one or both eyes.  You have sudden trouble walking.  You have dizziness.  You have a loss of balance or coordination.  You have a sudden, severe headache with no known cause.  You have new chest pain or an irregular heartbeat. Any of these symptoms may represent a serious problem that is an emergency. Do not wait to see if the symptoms will go away. Get medical help at once. Call your local emergency services  (911 in [place] Do not drive yourself to the hospital. Document Released: 11/25/2004 Document Revised: 08/08/2013 Document Reviewed: 04/20/2013 [facility] Patient Information 2014 [place].   Smoking Cessation Quitting smoking is important to your health and has many advantages. However, it is not always easy to quit since nicotine is a very addictive drug. Often times, people try 3 times or more before being able to quit. This document explains the best ways for you to prepare to quit smoking. Quitting takes hard work and a lot of effort, but you can do it. ADVANTAGES OF QUITTING SMOKING  You will live longer, feel better, and live better.  Your body will feel the impact of quitting smoking almost immediately.  Within 20 minutes, blood pressure decreases. Your pulse returns to its normal  level.  After 8 hours, carbon monoxide levels in the blood return to normal. Your oxygen level increases.  After 24 hours, the chance of having a heart attack starts to decrease. Your breath, hair, and body stop smelling like smoke.  After 48 hours, damaged nerve endings begin to recover. Your sense of taste and smell improve.  After 72 hours, the body is virtually free of nicotine. Your bronchial tubes relax and breathing becomes easier.  After 2 to 12 weeks, lungs can hold more air. Exercise becomes easier and circulation improves.  The risk of having a heart attack, stroke, cancer, or lung disease is greatly reduced.  After 1 year, the risk of coronary heart disease is cut in half.  After 5 years, the risk of stroke falls to the same as a nonsmoker.  After 10 years, the risk of lung cancer is cut in half and the risk of other cancers decreases  decreases significantly.  After 15 years, the risk of coronary heart disease drops, usually to the level of a nonsmoker.  If you are pregnant, quitting smoking will improve your chances of having a healthy baby.  The people you live with, especially any children, will be healthier.  You will have extra money to spend on things other than cigarettes. QUESTIONS TO THINK ABOUT BEFORE ATTEMPTING TO QUIT You may want to talk about your answers with your caregiver.  Why do you want to quit?  If you tried to quit in the past, what helped and what did not?  What will be the most difficult situations for you after you quit? How will you plan to handle them?  Who can help you through the tough times? Your family? Friends? A caregiver?  What pleasures do you get from smoking? What ways can you still get pleasure if you quit? Here are some questions to ask your caregiver:  How can you help me to be successful at quitting?  What medicine do you think would be best for me and how should I take it?  What should I do if I need more help?  What is  smoking withdrawal like? How can I get information on withdrawal? GET READY  Set a quit date.  Change your environment by getting rid of all cigarettes, ashtrays, matches, and lighters in your home, car, or work. Do not let people smoke in your home.  Review your past attempts to quit. Think about what worked and what did not. GET SUPPORT AND ENCOURAGEMENT You have a better chance of being successful if you have help. You can get support in many ways.  Tell your family, friends, and co-workers that you are going to quit and need their support. Ask them not to smoke around you.  Get individual, group, or telephone counseling and support. Programs are available at local hospitals and health centers. Call your local health department for information about programs in your area.  Spiritual beliefs and practices may help some smokers quit.  Download a "quit meter" on your computer to keep track of quit statistics, such as how long you have gone without smoking, cigarettes not smoked, and money saved.  Get a self-help book about quitting smoking and staying off of tobacco. LEARN NEW SKILLS AND BEHAVIORS  Distract yourself from urges to smoke. Talk to someone, go for a walk, or occupy your time with a task.  Change your normal routine. Take a different route to work. Drink tea instead of coffee. Eat breakfast in a different place.  Reduce your stress. Take a hot bath, exercise, or read a book.  Plan something enjoyable to do every day. Reward yourself for not smoking.  Explore interactive web-based programs that specialize in helping you quit. GET MEDICINE AND USE IT CORRECTLY Medicines can help you stop smoking and decrease the urge to smoke. Combining medicine with the above behavioral methods and support can greatly increase your chances of successfully quitting smoking.  Nicotine replacement therapy helps deliver nicotine to your body without the negative effects and risks of smoking.  Nicotine replacement therapy includes nicotine gum, lozenges, inhalers, nasal sprays, and skin patches. Some may be available over-the-counter and others require a prescription.  Antidepressant medicine helps people abstain from smoking, but how this works is unknown. This medicine is available by prescription.  Nicotinic receptor partial agonist medicine simulates the effect of nicotine in your brain. This medicine is available by prescription. Ask your caregiver for   advice about which medicines to use and how to use them based on your health history. Your caregiver will tell you what side effects to look out for if you choose to be on a medicine or therapy. Carefully read the information on the package. Do not use any other product containing nicotine while using a nicotine replacement product.  RELAPSE OR DIFFICULT SITUATIONS Most relapses occur within the first 3 months after quitting. Do not be discouraged if you start smoking again. Remember, most people try several times before finally quitting. You may have symptoms of withdrawal because your body is used to nicotine. You may crave cigarettes, be irritable, feel very hungry, cough often, get headaches, or have difficulty concentrating. The withdrawal symptoms are only temporary. They are strongest when you first quit, but they will go away within 10 14 days. To reduce the chances of relapse, try to:  Avoid drinking alcohol. Drinking lowers your chances of successfully quitting.  Reduce the amount of caffeine you consume. Once you quit smoking, the amount of caffeine in your body increases and can give you symptoms, such as a rapid heartbeat, sweating, and anxiety.  Avoid smokers because they can make you want to smoke.  Do not let weight gain distract you. Many smokers will gain weight when they quit, usually less than 10 pounds. Eat a healthy diet and stay active. You can always lose the weight gained after you quit.  Find ways to improve  your mood other than smoking. FOR MORE INFORMATION  www.smokefree.gov  Document Released: 10/12/2001 Document Revised: 04/18/2012 Document Reviewed: 01/27/2012 [employer] Patient Information 2014 [employer].  

## 2014-09-16 ENCOUNTER — Encounter (HOSPITAL_COMMUNITY): Payer: Self-pay

## 2014-09-16 ENCOUNTER — Ambulatory Visit: Payer: Self-pay | Admitting: Family

## 2014-09-16 ENCOUNTER — Other Ambulatory Visit (HOSPITAL_COMMUNITY): Payer: Self-pay

## 2014-09-23 ENCOUNTER — Encounter: Payer: Self-pay | Admitting: Family

## 2014-09-24 ENCOUNTER — Encounter (HOSPITAL_COMMUNITY): Payer: Self-pay

## 2014-09-24 ENCOUNTER — Ambulatory Visit: Payer: Self-pay | Admitting: Family

## 2014-09-24 ENCOUNTER — Other Ambulatory Visit (HOSPITAL_COMMUNITY): Payer: Self-pay

## 2014-10-08 ENCOUNTER — Encounter: Payer: Self-pay | Admitting: Family

## 2014-10-09 ENCOUNTER — Ambulatory Visit: Payer: Self-pay | Admitting: Family

## 2014-10-09 ENCOUNTER — Other Ambulatory Visit (HOSPITAL_COMMUNITY): Payer: Self-pay

## 2014-11-18 ENCOUNTER — Encounter: Payer: Self-pay | Admitting: Family

## 2014-11-19 ENCOUNTER — Ambulatory Visit: Payer: Self-pay | Admitting: Family

## 2014-11-19 ENCOUNTER — Other Ambulatory Visit (HOSPITAL_COMMUNITY): Payer: Self-pay

## 2014-12-11 ENCOUNTER — Encounter: Payer: Self-pay | Admitting: Family

## 2014-12-12 ENCOUNTER — Other Ambulatory Visit (HOSPITAL_COMMUNITY): Payer: Self-pay

## 2014-12-12 ENCOUNTER — Ambulatory Visit: Payer: Self-pay | Admitting: Family

## 2014-12-30 ENCOUNTER — Encounter: Payer: Self-pay | Admitting: Family

## 2014-12-31 ENCOUNTER — Ambulatory Visit (INDEPENDENT_AMBULATORY_CARE_PROVIDER_SITE_OTHER): Payer: Medicaid Other | Admitting: Family

## 2014-12-31 ENCOUNTER — Other Ambulatory Visit (HOSPITAL_COMMUNITY): Payer: Self-pay

## 2014-12-31 ENCOUNTER — Encounter: Payer: Self-pay | Admitting: Family

## 2014-12-31 VITALS — BP 118/63 | HR 79 | Resp 16 | Ht 63.0 in | Wt 206.0 lb

## 2014-12-31 DIAGNOSIS — I739 Peripheral vascular disease, unspecified: Secondary | ICD-10-CM

## 2014-12-31 DIAGNOSIS — Z720 Tobacco use: Secondary | ICD-10-CM

## 2014-12-31 DIAGNOSIS — E1151 Type 2 diabetes mellitus with diabetic peripheral angiopathy without gangrene: Secondary | ICD-10-CM

## 2014-12-31 DIAGNOSIS — E1159 Type 2 diabetes mellitus with other circulatory complications: Secondary | ICD-10-CM

## 2014-12-31 DIAGNOSIS — R252 Cramp and spasm: Secondary | ICD-10-CM | POA: Insufficient documentation

## 2014-12-31 DIAGNOSIS — I6522 Occlusion and stenosis of left carotid artery: Secondary | ICD-10-CM

## 2014-12-31 DIAGNOSIS — I6521 Occlusion and stenosis of right carotid artery: Secondary | ICD-10-CM

## 2014-12-31 DIAGNOSIS — F17200 Nicotine dependence, unspecified, uncomplicated: Secondary | ICD-10-CM

## 2014-12-31 DIAGNOSIS — M25569 Pain in unspecified knee: Secondary | ICD-10-CM | POA: Insufficient documentation

## 2014-12-31 NOTE — Progress Notes (Signed)
[facility] [place] HISTORY AND PHYSICAL   MRN : [id number]  History of Present Illness:   Sherri Hays is a 64 y.o. female who is s/p right CEA on 09/21/2012 by Dr. Kellie Simmering. 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 She has chronic right arm weakness which has not changed since her surgery. She states she is swallowing well and has no hoarseness. She is in a nursing facility and is on nasal oxygen at night.  Apparently her ins. will cover a yearly carotid US.  I last saw her 03/19/14. At that time I requested a 6 months follow up with carotid Duplex and ABI's, both were denied by her insurer.   Patient states she walks "a good ways" before her left hip starts to hurt, this is relieved by sitting.  Has what sounds like neuropathy symptoms in her feet at times (is diabetic), with numbness, tingling, and burning intermittently.   Patient states pain in both calves with standing, postpones walking due to this, walks with walker; denies non-healing wounds   March, 2015 she had pt had an injection in her right hip, helped her right hip pain for a while. She takes a daily ASA, a statin, and clopidogrel.   Pt Diabetic: Yes, pt. States she was told that her sugar is in good control  Pt smoker: smoker (1/2 ppd x 30 yrs); states she has tried Chantix and states it did not decrease her urge to smoke.    Current Outpatient Prescriptions  Medication Sig Dispense Refill  . acetaminophen (TYLENOL) 325 MG tablet Take 650 mg by mouth every 6 (six) hours as needed.    Marland Kitchen albuterol (PROVENTIL HFA;VENTOLIN HFA) 108 (90 BASE) MCG/ACT inhaler Inhale 2 puffs into the lungs every 6 (six) hours as needed. For dyspnea 1 Inhaler 11  . amLODipine (NORVASC) 10 MG tablet Take 10 mg by mouth daily.    Marland Kitchen aspirin 81 MG tablet Take 81 mg by mouth daily.    Marland Kitchen atorvastatin (LIPITOR) 10 MG tablet Take 1 tablet (10 mg total) by mouth at bedtime.  For cholesterol.    . clopidogrel (PLAVIX) 75 MG tablet Take 75 mg by mouth daily with breakfast.    . diclofenac (VOLTAREN) 25 MG EC tablet Take 25 mg by mouth 2 (two) times daily.    . ferrous fumarate (HEMOCYTE - 106 MG FE) 325 (106 FE) MG TABS tablet Take 1 tablet by mouth.    . fluPHENAZine (PROLIXIN) 5 MG tablet Take 10 mg by mouth at bedtime.    . Fluticasone-Salmeterol (ADVAIR DISKUS) 250-50 MCG/DOSE AEPB Inhale 1 puff into the lungs 2 (two) times daily. For COPD 60 each 11  . gabapentin (NEURONTIN) 300 MG capsule Take 300 mg by mouth 3 (three) times daily. For anxiety and depression.     . haloperidol decanoate (HALDOL DECANOATE) 100 MG/ML injection Inject 75 mg into the muscle every 28 (twenty-eight) days.    . hydrOXYzine (ATARAX/VISTARIL) 25 MG tablet Take 25 mg by mouth every 4 (four) hours as needed. For anxiety    . insulin detemir (LEVEMIR) 100 UNIT/ML injection Inject into the skin at bedtime.    . insulin glargine (LANTUS) 100 UNIT/ML injection Inject 30 Units into the skin at bedtime. For glycemic control. 10 mL   . Maltodextrin-Xanthan Gum (RESOURCE THICKENUP CLEAR) POWD usem in liquids as needed    . metFORMIN (GLUCOPHAGE) 500 MG tablet Take 1 tablet (  500 mg total) by mouth 2 (two) times daily with a meal. For glycemic control.    . Multiple Vitamins-Minerals (THERA M PLUS PO) Take by mouth.    . oxybutynin (DITROPAN-XL) 10 MG 24 hr tablet Take 10 mg by mouth daily.    Marland Kitchen oxyCODONE-acetaminophen (PERCOCET/ROXICET) 5-325 MG per tablet Take 1-2 tablets by mouth every 4 (four) hours as needed. 20 tablet 0  . pantoprazole (PROTONIX) 40 MG tablet Take 40 mg by mouth daily.    Marland Kitchen Propylene Glycol (SYSTANE BALANCE OP) Apply to eye.    . risperiDONE microspheres (RISPERDAL CONSTA) 25 MG injection Inject 25 mg into the muscle every 14 (fourteen) days.    . traZODone (DESYREL) 100 MG tablet Take 100 mg by mouth at bedtime.    . trihexyphenidyl (ARTANE) 2 MG tablet Take 1 mg by mouth at  bedtime.    . hydrALAZINE (APRESOLINE) 50 MG tablet Take 1 tablet (50 mg total) by mouth 3 (three) times daily. For elevated blood pressure. 90 tablet 0  . sertraline (ZOLOFT) 100 MG tablet Take 2 tablets (200 mg total) by mouth daily. For depression and anxiety. 60 tablet 0  . trihexyphenidyl (ARTANE) 2 MG tablet Take 0.5 tablets (1 mg total) by mouth at bedtime. For EPS. 15 tablet 0   No current facility-administered medications for this visit.    Past Medical History  Diagnosis Date  . Dysphagia   . TIA (transient ischemic attack)   . Genital herpes   . DM neuropathies   . Anemia   . Asthma   . Cancer     lt breast  . Schizophrenia, simple   . Diabetes mellitus   . Pneumonia     hx of  . Sleep apnea     "wears cpap, unsure of settings"  . Stroke     2011  . Neuromuscular disorder     diabetic neuropathy  . COPD (chronic obstructive pulmonary disease)   . Hypertension     physician home visits, Dr. Fredderick Phenix [phone]  . Hyperlipidemia   . Arthritis     Osteoarthritis  . DJD (degenerative joint disease)     Social History History  Substance Use Topics  . Smoking status: Current Some Day Smoker -- 0.50 packs/day    Types: Cigarettes  . Smokeless tobacco: Never Used  . Alcohol Use: No    Family History History reviewed. No pertinent family history.  Surgical History Past Surgical History  Procedure Laterality Date  . Cholecystectomy    . Cesarean section    . Tonsillectomy    . Endarterectomy  09/21/2012    Procedure: ENDARTERECTOMY CAROTID;  Surgeon: Mal Misty, MD;  Location: [facility];  Service: Vascular;  Laterality: Right;  . Patch angioplasty  09/21/2012    Procedure: PATCH ANGIOPLASTY;  Surgeon: Mal Misty, MD;  Location: [facility];  Service: Vascular;  Laterality: Right;  with dacron patch angioplasty  . Carotid endarterectomy      Allergies  Allergen Reactions  . Penicillins Other (See Comments)    Unknown ( Pt was told as a baby, she is  allergic to Penicillins )    Current Outpatient Prescriptions  Medication Sig Dispense Refill  . acetaminophen (TYLENOL) 325 MG tablet Take 650 mg by mouth every 6 (six) hours as needed.    Marland Kitchen albuterol (PROVENTIL HFA;VENTOLIN HFA) 108 (90 BASE) MCG/ACT inhaler Inhale 2 puffs into the lungs every 6 (six) hours as needed. For dyspnea 1 Inhaler 11  . amLODipine (  NORVASC) 10 MG tablet Take 10 mg by mouth daily.    Marland Kitchen aspirin 81 MG tablet Take 81 mg by mouth daily.    Marland Kitchen atorvastatin (LIPITOR) 10 MG tablet Take 1 tablet (10 mg total) by mouth at bedtime. For cholesterol.    . clopidogrel (PLAVIX) 75 MG tablet Take 75 mg by mouth daily with breakfast.    . diclofenac (VOLTAREN) 25 MG EC tablet Take 25 mg by mouth 2 (two) times daily.    . ferrous fumarate (HEMOCYTE - 106 MG FE) 325 (106 FE) MG TABS tablet Take 1 tablet by mouth.    . fluPHENAZine (PROLIXIN) 5 MG tablet Take 10 mg by mouth at bedtime.    . Fluticasone-Salmeterol (ADVAIR DISKUS) 250-50 MCG/DOSE AEPB Inhale 1 puff into the lungs 2 (two) times daily. For COPD 60 each 11  . gabapentin (NEURONTIN) 300 MG capsule Take 300 mg by mouth 3 (three) times daily. For anxiety and depression.     . haloperidol decanoate (HALDOL DECANOATE) 100 MG/ML injection Inject 75 mg into the muscle every 28 (twenty-eight) days.    . hydrOXYzine (ATARAX/VISTARIL) 25 MG tablet Take 25 mg by mouth every 4 (four) hours as needed. For anxiety    . insulin detemir (LEVEMIR) 100 UNIT/ML injection Inject into the skin at bedtime.    . insulin glargine (LANTUS) 100 UNIT/ML injection Inject 30 Units into the skin at bedtime. For glycemic control. 10 mL   . Maltodextrin-Xanthan Gum (RESOURCE THICKENUP CLEAR) POWD usem in liquids as needed    . metFORMIN (GLUCOPHAGE) 500 MG tablet Take 1 tablet (500 mg total) by mouth 2 (two) times daily with a meal. For glycemic control.    . Multiple Vitamins-Minerals (THERA M PLUS PO) Take by mouth.    . oxybutynin (DITROPAN-XL) 10 MG 24  hr tablet Take 10 mg by mouth daily.    Marland Kitchen oxyCODONE-acetaminophen (PERCOCET/ROXICET) 5-325 MG per tablet Take 1-2 tablets by mouth every 4 (four) hours as needed. 20 tablet 0  . pantoprazole (PROTONIX) 40 MG tablet Take 40 mg by mouth daily.    Marland Kitchen Propylene Glycol (SYSTANE BALANCE OP) Apply to eye.    . risperiDONE microspheres (RISPERDAL CONSTA) 25 MG injection Inject 25 mg into the muscle every 14 (fourteen) days.    . traZODone (DESYREL) 100 MG tablet Take 100 mg by mouth at bedtime.    . trihexyphenidyl (ARTANE) 2 MG tablet Take 1 mg by mouth at bedtime.    . hydrALAZINE (APRESOLINE) 50 MG tablet Take 1 tablet (50 mg total) by mouth 3 (three) times daily. For elevated blood pressure. 90 tablet 0  . sertraline (ZOLOFT) 100 MG tablet Take 2 tablets (200 mg total) by mouth daily. For depression and anxiety. 60 tablet 0  . trihexyphenidyl (ARTANE) 2 MG tablet Take 0.5 tablets (1 mg total) by mouth at bedtime. For EPS. 15 tablet 0   No current facility-administered medications for this visit.     REVIEW OF SYSTEMS: See HPI for pertinent positives and negatives.  Physical Examination Filed Vitals:   12/31/14 1602  BP: 118/63  Pulse: 79  Resp: 16  Height: 5\' 3"  (1.6 m)  Weight: 206 lb (93.441 kg)  SpO2: 97%   Body mass index is 36.5 kg/(m^2).  General: WDWN obese female in NAD  GAIT: in wheelchair today  Eyes: Pupils =  Pulmonary: diminished air movement in all fields, Negative Rales, Negative rhonchi, & positive wheezing.  Cardiac: regular Rhythm, no appreciable murmur, bilateral pretibial pitting edema,  trace.   VASCULAR EXAM  Carotid Bruits  Left  Right    Negative  Negative    LE Pulses  LEFT  RIGHT   POPLITEAL  not palpable  not palpable   POSTERIOR TIBIAL  not palpable  not palpable   DORSALIS PEDIS  ANTERIOR TIBIAL  Not palpable   palpable    Gastrointestinal: soft, nontender, BS WNL, no r/g, no palpable masses.  Musculoskeletal: Negative  muscle atrophy/wasting. M/S 4/5 throughout except right LE is 3/5, Extremities without ischemic changes.  Neurologic: A&O to person and place, states the current year is 1956; Appropriate Affect; MOTOR FUNCTION: no muscle wasting or atrophy  Speech is slow but fluent.  CN 2-12 intact, Pain and light touch intact in extremities, Motor exam as listed above.    Non-Invasive Vascular Imaging: DATE: 03/19/2014 ABI: RIGHT 0.68, Waveforms: biphasic; LEFT 0.56, Waveforms: monophasic   Previous angiogram: CT ANGIOGRAPHY HEAD AND NECK with contrast (07/07/12)  IMPRESSION:  NECK:  1. Left ICA occluded at its origin with no reconstituted flow in  the neck.  2. High-grade stenosis of the right ICA at the level of the distal  bulb, 75% stenosis with respect the distal vessel.  3. High-grade stenosis of the proximal right common carotid artery  estimated at 65-70%.  4. Dominant left vertebral artery with mild if any stenosis at its  origin. Diminutive right vertebral artery throughout the neck.  5. See intracranial findings below.  6. Chronic ununited C1 ring fracture.  HEAD:  1. Thrombosed left ICA siphon. Some reconstituted flow in the  left supraclinoid ICA, such that the left ICA terminus, left MCA,  and left ACA origins remain patent.  2. Patent but attenuated left MCA major branches compared to those  on the right.  3. Symmetric ACA flow.  4. Distal ACA stenosis. Right MCA branches within normal limits.  5. Posterior circulation mild atherosclerosis with no major branch  occlusion.  6. Acute left MCA infarcts seen earlier are not well visualized by  CT. No mass effect or hemorrhage.           ASSESSMENT:  Sherri Hays is a 64 y.o. 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  Her risk factors for the progression of atherosclerosis are DM, smoking, obesity,  chronic sedentarism. She has a history of falling and does not walk much due to this; she gets around in a wheelchair. She has no tissue loss. Her insurer did not approve a carotid Duplex nor ABI's for today.  PLAN:   Based on today's exam and previous non-invasive vascular lab results, the patient will follow up in May 2016, after May 19, with the following tests carotid Duplex and ABI's.  The patient was counseled re smoking cessation and given several free resources re smoking cessation; referred for smoking cessation class.  I discussed in depth with the patient the nature of atherosclerosis, and emphasized the importance of maximal medical management including strict control of blood pressure, blood glucose, and lipid levels, obtaining regular exercise, and cessation of smoking.  The patient is aware that without maximal medical management the underlying atherosclerotic disease process will progress, limiting the benefit of any interventions.  The patient was given information about stroke prevention and what symptoms should prompt the patient to seek immediate medical care.  The patient was given information about PAD including signs, symptoms,  treatment, what symptoms should prompt the patient to seek immediate medical care, and risk reduction measures to take. Thank you for allowing [place] to participate in this patient's care.  Clemon Chambers, RN, MSN, FNP-C [facility] Office: [phone]  Clinic MD: Kellie Simmering 12/31/2014 4:05 PM

## 2014-12-31 NOTE — Patient Instructions (Signed)
Stroke Prevention Some medical conditions and behaviors are associated with an increased chance of having a stroke. You may prevent a stroke by making healthy choices and managing medical conditions. HOW CAN I REDUCE MY RISK OF HAVING A STROKE?   Stay physically active. Get at least 30 minutes of activity on most or all days.  Do not smoke. It may also be helpful to avoid exposure to secondhand smoke.  Limit alcohol use. Moderate alcohol use is considered to be:  No more than 2 drinks per day for men.  No more than 1 drink per day for nonpregnant women.  Eat healthy foods. This involves:  Eating 5 or more servings of fruits and vegetables a day.  Making dietary changes that address high blood pressure (hypertension), high cholesterol, diabetes, or obesity.  Manage your cholesterol levels.  Making food choices that are high in fiber and low in saturated fat, trans fat, and cholesterol may control cholesterol levels.  Take any prescribed medicines to control cholesterol as directed by your health care provider.  Manage your diabetes.  Controlling your carbohydrate and sugar intake is recommended to manage diabetes.  Take any prescribed medicines to control diabetes as directed by your health care provider.  Control your hypertension.  Making food choices that are low in salt (sodium), saturated fat, trans fat, and cholesterol is recommended to manage hypertension.  Take any prescribed medicines to control hypertension as directed by your health care provider.  Maintain a healthy weight.  Reducing calorie intake and making food choices that are low in sodium, saturated fat, trans fat, and cholesterol are recommended to manage weight.  Stop drug abuse.  Avoid taking birth control pills.  Talk to your health care provider about the risks of taking birth control pills if you are over 35 years old, smoke, get migraines, or have ever had a blood clot.  Get evaluated for sleep  disorders (sleep apnea).  Talk to your health care provider about getting a sleep evaluation if you snore a lot or have excessive sleepiness.  Take medicines only as directed by your health care provider.  For some people, aspirin or blood thinners (anticoagulants) are helpful in reducing the risk of forming abnormal blood clots that can lead to stroke. If you have the irregular heart rhythm of atrial fibrillation, you should be on a blood thinner unless there is a good reason you cannot take them.  Understand all your medicine instructions.  Make sure that other conditions (such as anemia or atherosclerosis) are addressed. SEEK IMMEDIATE MEDICAL CARE IF:   You have sudden weakness or numbness of the face, arm, or leg, especially on one side of the body.  Your face or eyelid droops to one side.  You have sudden confusion.  You have trouble speaking (aphasia) or understanding.  You have sudden trouble seeing in one or both eyes.  You have sudden trouble walking.  You have dizziness.  You have a loss of balance or coordination.  You have a sudden, severe headache with no known cause.  You have new chest pain or an irregular heartbeat. Any of these symptoms may represent a serious problem that is an emergency. Do not wait to see if the symptoms will go away. Get medical help at once. Call your local emergency services (911 in [place] Do not drive yourself to the hospital. Document Released: 11/25/2004 Document Revised: 03/04/2014 Document Reviewed: 04/20/2013 [employer] Patient Information 2015 [employer]. This information is not intended to replace advice given   to you by your health care provider. Make sure you discuss any questions you have with your health care provider.     Peripheral Vascular Disease Peripheral Vascular Disease (PVD), also called Peripheral Arterial Disease (PAD), is a circulation problem caused by cholesterol (atherosclerotic plaque) deposits in the  arteries. PVD commonly occurs in the lower extremities (legs) but it can occur in other areas of the body, such as your arms. The cholesterol buildup in the arteries reduces blood flow which can cause pain and other serious problems. The presence of PVD can place a person at risk for Coronary Artery Disease (CAD).  CAUSES  Causes of PVD can be many. It is usually associated with more than one risk factor such as:   High Cholesterol.  Smoking.  Diabetes.  Lack of exercise or inactivity.  High blood pressure (hypertension).  Obesity.  Family history. SYMPTOMS   When the lower extremities are affected, patients with PVD may experience:  Leg pain with exertion or physical activity. This is called INTERMITTENT CLAUDICATION. This may present as cramping or numbness with physical activity. The location of the pain is associated with the level of blockage. For example, blockage at the abdominal level (distal abdominal aorta) may result in buttock or hip pain. Lower leg arterial blockage may result in calf pain.  As PVD becomes more severe, pain can develop with less physical activity.  In people with severe PVD, leg pain may occur at rest.  Other PVD signs and symptoms:  Leg numbness or weakness.  Coldness in the affected leg or foot, especially when compared to the other leg.  A change in leg color.  Patients with significant PVD are more prone to ulcers or sores on toes, feet or legs. These may take longer to heal or may reoccur. The ulcers or sores can become infected.  If signs and symptoms of PVD are ignored, gangrene may occur. This can result in the loss of toes or loss of an entire limb.  Not all leg pain is related to PVD. Other medical conditions can cause leg pain such as:  Blood clots (embolism) or Deep Vein Thrombosis.  Inflammation of the blood vessels (vasculitis).  Spinal stenosis. DIAGNOSIS  Diagnosis of PVD can involve several different types of tests. These  can include:  Pulse Volume Recording Method (PVR). This test is simple, painless and does not involve the use of X-rays. PVR involves measuring and comparing the blood pressure in the arms and legs. An ABI (Ankle-Brachial Index) is calculated. The normal ratio of blood pressures is 1. As this number becomes smaller, it indicates more severe disease.  < 0.95 - indicates significant narrowing in one or more leg vessels.  <0.8 - there will usually be pain in the foot, leg or buttock with exercise.  <0.4 - will usually have pain in the legs at rest.  <0.25 - usually indicates limb threatening PVD.  Doppler detection of pulses in the legs. This test is painless and checks to see if you have a pulses in your legs/feet.  A dye or contrast material (a substance that highlights the blood vessels so they show up on x-ray) may be given to help your caregiver better see the arteries for the following tests. The dye is eliminated from your body by the kidney's. Your caregiver may order blood work to check your kidney function and other laboratory values before the following tests are performed:  Magnetic Resonance Angiography (MRA). An MRA is a picture study of the   blood vessels and arteries. The MRA machine uses a large magnet to produce images of the blood vessels.  Computed Tomography Angiography (CTA). A CTA is a specialized x-ray that looks at how the blood flows in your blood vessels. An IV may be inserted into your arm so contrast dye can be injected.  Angiogram. Is a procedure that uses x-rays to look at your blood vessels. This procedure is minimally invasive, meaning a small incision (cut) is made in your groin. A small tube (catheter) is then inserted into the artery of your groin. The catheter is guided to the blood vessel or artery your caregiver wants to examine. Contrast dye is injected into the catheter. X-rays are then taken of the blood vessel or artery. After the images are obtained, the  catheter is taken out. TREATMENT  Treatment of PVD involves many interventions which may include:  Lifestyle changes:  Quitting smoking.  Exercise.  Following a low fat, low cholesterol diet.  Control of diabetes.  Foot care is very important to the PVD patient. Good foot care can help prevent infection.  Medication:  Cholesterol-lowering medicine.  Blood pressure medicine.  Anti-platelet drugs.  Certain medicines may reduce symptoms of Intermittent Claudication.  Interventional/Surgical options:  Angioplasty. An Angioplasty is a procedure that inflates a balloon in the blocked artery. This opens the blocked artery to improve blood flow.  Stent Implant. A wire mesh tube (stent) is placed in the artery. The stent expands and stays in place, allowing the artery to remain open.  Peripheral Bypass Surgery. This is a surgical procedure that reroutes the blood around a blocked artery to help improve blood flow. This type of procedure may be performed if Angioplasty or stent implants are not an option. SEEK IMMEDIATE MEDICAL CARE IF:   You develop pain or numbness in your arms or legs.  Your arm or leg turns cold, becomes blue in color.  You develop redness, warmth, swelling and pain in your arms or legs. MAKE SURE YOU:   Understand these instructions.  Will watch your condition.  Will get help right away if you are not doing well or get worse. Document Released: 11/25/2004 Document Revised: 01/10/2012 Document Reviewed: 10/22/2008 Exit[employer] Patient Information 2015 [employer]. This information is not intended to replace advice given to you by your health care provider. Make sure you discuss any questions you have with your health care provider.    Smoking Cessation Quitting smoking is important to your health and has many advantages. However, it is not always easy to quit since nicotine is a very addictive drug. Oftentimes, people try 3 times or more before being  able to quit. This document explains the best ways for you to prepare to quit smoking. Quitting takes hard work and a lot of effort, but you can do it. ADVANTAGES OF QUITTING SMOKING  You will live longer, feel better, and live better.  Your body will feel the impact of quitting smoking almost immediately.  Within 20 minutes, blood pressure decreases. Your pulse returns to its normal level.  After 8 hours, carbon monoxide levels in the blood return to normal. Your oxygen level increases.  After 24 hours, the chance of having a heart attack starts to decrease. Your breath, hair, and body stop smelling like smoke.  After 48 hours, damaged nerve endings begin to recover. Your sense of taste and smell improve.  After 72 hours, the body is virtually free of nicotine. Your bronchial tubes relax and breathing becomes easier.    After 2 to 12 weeks, lungs can hold more air. Exercise becomes easier and circulation improves.  The risk of having a heart attack, stroke, cancer, or lung disease is greatly reduced.  After 1 year, the risk of coronary heart disease is cut in half.  After 5 years, the risk of stroke falls to the same as a nonsmoker.  After 10 years, the risk of lung cancer is cut in half and the risk of other cancers decreases significantly.  After 15 years, the risk of coronary heart disease drops, usually to the level of a nonsmoker.  If you are pregnant, quitting smoking will improve your chances of having a healthy baby.  The people you live with, especially any children, will be healthier.  You will have extra money to spend on things other than cigarettes. QUESTIONS TO THINK ABOUT BEFORE ATTEMPTING TO QUIT You may want to talk about your answers with your health care provider.  Why do you want to quit?  If you tried to quit in the past, what helped and what did not?  What will be the most difficult situations for you after you quit? How will you plan to handle  them?  Who can help you through the tough times? Your family? Friends? A health care provider?  What pleasures do you get from smoking? What ways can you still get pleasure if you quit? Here are some questions to ask your health care provider:  How can you help me to be successful at quitting?  What medicine do you think would be best for me and how should I take it?  What should I do if I need more help?  What is smoking withdrawal like? How can I get information on withdrawal? GET READY  Set a quit date.  Change your environment by getting rid of all cigarettes, ashtrays, matches, and lighters in your home, car, or work. Do not let people smoke in your home.  Review your past attempts to quit. Think about what worked and what did not. GET SUPPORT AND ENCOURAGEMENT You have a better chance of being successful if you have help. You can get support in many ways.  Tell your family, friends, and coworkers that you are going to quit and need their support. Ask them not to smoke around you.  Get individual, group, or telephone counseling and support. Programs are available at local hospitals and health centers. Call your local health department for information about programs in your area.  Spiritual beliefs and practices may help some smokers quit.  Download a "quit meter" on your computer to keep track of quit statistics, such as how long you have gone without smoking, cigarettes not smoked, and money saved.  Get a self-help book about quitting smoking and staying off tobacco. LEARN NEW SKILLS AND BEHAVIORS  Distract yourself from urges to smoke. Talk to someone, go for a walk, or occupy your time with a task.  Change your normal routine. Take a different route to work. Drink tea instead of coffee. Eat breakfast in a different place.  Reduce your stress. Take a hot bath, exercise, or read a book.  Plan something enjoyable to do every day. Reward yourself for not  smoking.  Explore interactive web-based programs that specialize in helping you quit. GET MEDICINE AND USE IT CORRECTLY Medicines can help you stop smoking and decrease the urge to smoke. Combining medicine with the above behavioral methods and support can greatly increase your chances of successfully quitting smoking.    Nicotine replacement therapy helps deliver nicotine to your body without the negative effects and risks of smoking. Nicotine replacement therapy includes nicotine gum, lozenges, inhalers, nasal sprays, and skin patches. Some may be available over-the-counter and others require a prescription.  Antidepressant medicine helps people abstain from smoking, but how this works is unknown. This medicine is available by prescription.  Nicotinic receptor partial agonist medicine simulates the effect of nicotine in your brain. This medicine is available by prescription. Ask your health care provider for advice about which medicines to use and how to use them based on your health history. Your health care provider will tell you what side effects to look out for if you choose to be on a medicine or therapy. Carefully read the information on the package. Do not use any other product containing nicotine while using a nicotine replacement product.  RELAPSE OR DIFFICULT SITUATIONS Most relapses occur within the first 3 months after quitting. Do not be discouraged if you start smoking again. Remember, most people try several times before finally quitting. You may have symptoms of withdrawal because your body is used to nicotine. You may crave cigarettes, be irritable, feel very hungry, cough often, get headaches, or have difficulty concentrating. The withdrawal symptoms are only temporary. They are strongest when you first quit, but they will go away within 10-14 days. To reduce the chances of relapse, try to:  Avoid drinking alcohol. Drinking lowers your chances of successfully quitting.  Reduce the  amount of caffeine you consume. Once you quit smoking, the amount of caffeine in your body increases and can give you symptoms, such as a rapid heartbeat, sweating, and anxiety.  Avoid smokers because they can make you want to smoke.  Do not let weight gain distract you. Many smokers will gain weight when they quit, usually less than 10 pounds. Eat a healthy diet and stay active. You can always lose the weight gained after you quit.  Find ways to improve your mood other than smoking. FOR MORE INFORMATION  www.smokefree.gov  Document Released: 10/12/2001 Document Revised: 03/04/2014 Document Reviewed: 01/27/2012 [employer] Patient Information 2015 [employer]. This information is not intended to replace advice given to you by your health care provider. Make sure you discuss any questions you have with your health care provider.    Smoking Cessation, Tips for Success If you are ready to quit smoking, congratulations! You have chosen to help yourself be healthier. Cigarettes bring nicotine, tar, carbon monoxide, and other irritants into your body. Your lungs, heart, and blood vessels will be able to work better without these poisons. There are many different ways to quit smoking. Nicotine gum, nicotine patches, a nicotine inhaler, or nicotine nasal spray can help with physical craving. Hypnosis, support groups, and medicines help break the habit of smoking. WHAT THINGS CAN I DO TO MAKE QUITTING EASIER?  Here are some tips to help you quit for good:  Pick a date when you will quit smoking completely. Tell all of your friends and family about your plan to quit on that date.  Do not try to slowly cut down on the number of cigarettes you are smoking. Pick a quit date and quit smoking completely starting on that day.  Throw away all cigarettes.   Clean and remove all ashtrays from your home, work, and car.  On a card, write down your reasons for quitting. Carry the card with you and read it  when you get the urge to smoke.  Cleanse your body   of nicotine. Drink enough water and fluids to keep your urine clear or pale yellow. Do this after quitting to flush the nicotine from your body.  Learn to predict your moods. Do not let a bad situation be your excuse to have a cigarette. Some situations in your life might tempt you into wanting a cigarette.  Never have "just one" cigarette. It leads to wanting another and another. Remind yourself of your decision to quit.  Change habits associated with smoking. If you smoked while driving or when feeling stressed, try other activities to replace smoking. Stand up when drinking your coffee. Brush your teeth after eating. Sit in a different chair when you read the paper. Avoid alcohol while trying to quit, and try to drink fewer caffeinated beverages. Alcohol and caffeine may urge you to smoke.  Avoid foods and drinks that can trigger a desire to smoke, such as sugary or spicy foods and alcohol.  Ask people who smoke not to smoke around you.  Have something planned to do right after eating or having a cup of coffee. For example, plan to take a walk or exercise.  Try a relaxation exercise to calm you down and decrease your stress. Remember, you may be tense and nervous for the first 2 weeks after you quit, but this will pass.  Find new activities to keep your hands busy. Play with a pen, coin, or rubber band. Doodle or draw things on paper.  Brush your teeth right after eating. This will help cut down on the craving for the taste of tobacco after meals. You can also try mouthwash.   Use oral substitutes in place of cigarettes. Try using lemon drops, carrots, cinnamon sticks, or chewing gum. Keep them handy so they are available when you have the urge to smoke.  When you have the urge to smoke, try deep breathing.  Designate your home as a nonsmoking area.  If you are a heavy smoker, ask your health care provider about a prescription for  nicotine chewing gum. It can ease your withdrawal from nicotine.  Reward yourself. Set aside the cigarette money you save and buy yourself something nice.  Look for support from others. Join a support group or smoking cessation program. Ask someone at home or at work to help you with your plan to quit smoking.  Always ask yourself, "Do I need this cigarette or is this just a reflex?" Tell yourself, "Today, I choose not to smoke," or "I do not want to smoke." You are reminding yourself of your decision to quit.  Do not replace cigarette smoking with electronic cigarettes (commonly called e-cigarettes). The safety of e-cigarettes is unknown, and some may contain harmful chemicals.  If you relapse, do not give up! Plan ahead and think about what you will do the next time you get the urge to smoke. HOW WILL I FEEL WHEN I QUIT SMOKING? You may have symptoms of withdrawal because your body is used to nicotine (the addictive substance in cigarettes). You may crave cigarettes, be irritable, feel very hungry, cough often, get headaches, or have difficulty concentrating. The withdrawal symptoms are only temporary. They are strongest when you first quit but will go away within 10-14 days. When withdrawal symptoms occur, stay in control. Think about your reasons for quitting. Remind yourself that these are signs that your body is healing and getting used to being without cigarettes. Remember that withdrawal symptoms are easier to treat than the major diseases that smoking can cause.    Even after the withdrawal is over, expect periodic urges to smoke. However, these cravings are generally short lived and will go away whether you smoke or not. Do not smoke! WHAT RESOURCES ARE AVAILABLE TO HELP ME QUIT SMOKING? Your health care provider can direct you to community resources or hospitals for support, which may include:  Group support.  Education.  Hypnosis.  Therapy. Document Released: 07/16/2004 Document  Revised: 03/04/2014 Document Reviewed: 04/05/2013 [employer] Patient Information 2015 [employer]. This information is not intended to replace advice given to you by your health care provider. Make sure you discuss any questions you have with your health care provider.  

## 2015-01-01 NOTE — Addendum Note (Signed)
Addended by: Dorthula Rue L on: 01/01/2015 09:48 AM   Modules accepted: Orders

## 2015-01-06 ENCOUNTER — Ambulatory Visit: Payer: Self-pay | Admitting: Family

## 2015-01-06 ENCOUNTER — Other Ambulatory Visit (HOSPITAL_COMMUNITY): Payer: Self-pay

## 2015-02-27 ENCOUNTER — Encounter: Payer: Self-pay | Admitting: Family

## 2015-02-28 ENCOUNTER — Ambulatory Visit: Payer: Self-pay | Admitting: Family

## 2015-03-20 ENCOUNTER — Encounter: Payer: Self-pay | Admitting: Family

## 2015-03-21 ENCOUNTER — Encounter (HOSPITAL_COMMUNITY): Payer: Self-pay

## 2015-03-21 ENCOUNTER — Ambulatory Visit: Payer: Self-pay | Admitting: Family

## 2015-03-21 ENCOUNTER — Encounter: Payer: Self-pay | Admitting: Family

## 2015-03-25 ENCOUNTER — Encounter (HOSPITAL_COMMUNITY): Payer: Self-pay

## 2015-03-25 ENCOUNTER — Ambulatory Visit: Payer: Self-pay | Admitting: Family

## 2015-04-07 ENCOUNTER — Encounter: Payer: Self-pay | Admitting: Family

## 2015-04-08 ENCOUNTER — Encounter: Payer: Self-pay | Admitting: Family

## 2015-04-09 ENCOUNTER — Encounter: Payer: Self-pay | Admitting: Family

## 2015-04-09 ENCOUNTER — Ambulatory Visit (HOSPITAL_COMMUNITY)
Admission: RE | Admit: 2015-04-09 | Discharge: 2015-04-09 | Disposition: A | Discharge status: Home / Self Care | Payer: Medicaid Other | Source: Ambulatory Visit | Attending: Family | Admitting: Family

## 2015-04-09 ENCOUNTER — Ambulatory Visit (INDEPENDENT_AMBULATORY_CARE_PROVIDER_SITE_OTHER): Payer: Medicaid Other | Admitting: Family

## 2015-04-09 ENCOUNTER — Ambulatory Visit (INDEPENDENT_AMBULATORY_CARE_PROVIDER_SITE_OTHER)
Admission: RE | Admit: 2015-04-09 | Discharge: 2015-04-09 | Disposition: A | Discharge status: Home / Self Care | Payer: Medicaid Other | Source: Ambulatory Visit | Attending: Family | Admitting: Family

## 2015-04-09 VITALS — BP 129/71 | HR 75 | Resp 16 | Ht 63.0 in | Wt 206.0 lb

## 2015-04-09 DIAGNOSIS — I6521 Occlusion and stenosis of right carotid artery: Secondary | ICD-10-CM

## 2015-04-09 DIAGNOSIS — E119 Type 2 diabetes mellitus without complications: Secondary | ICD-10-CM | POA: Insufficient documentation

## 2015-04-09 DIAGNOSIS — Z720 Tobacco use: Secondary | ICD-10-CM | POA: Diagnosis not present

## 2015-04-09 DIAGNOSIS — I6522 Occlusion and stenosis of left carotid artery: Secondary | ICD-10-CM | POA: Insufficient documentation

## 2015-04-09 DIAGNOSIS — I739 Peripheral vascular disease, unspecified: Secondary | ICD-10-CM

## 2015-04-09 DIAGNOSIS — E1151 Type 2 diabetes mellitus with diabetic peripheral angiopathy without gangrene: Secondary | ICD-10-CM

## 2015-04-09 DIAGNOSIS — F17200 Nicotine dependence, unspecified, uncomplicated: Secondary | ICD-10-CM

## 2015-04-09 DIAGNOSIS — E1159 Type 2 diabetes mellitus with other circulatory complications: Secondary | ICD-10-CM

## 2015-04-09 DIAGNOSIS — Z48812 Encounter for surgical aftercare following surgery on the circulatory system: Secondary | ICD-10-CM

## 2015-04-09 DIAGNOSIS — Z8673 Personal history of transient ischemic attack (TIA), and cerebral infarction without residual deficits: Secondary | ICD-10-CM

## 2015-04-09 DIAGNOSIS — M25569 Pain in unspecified knee: Secondary | ICD-10-CM | POA: Insufficient documentation

## 2015-04-09 NOTE — Patient Instructions (Signed)
Stroke Prevention Some medical conditions and behaviors are associated with an increased chance of having a stroke. You may prevent a stroke by making healthy choices and managing medical conditions. HOW CAN I REDUCE MY RISK OF HAVING A STROKE?   Stay physically active. Get at least 30 minutes of activity on most or all days.  Do not smoke. It may also be helpful to avoid exposure to secondhand smoke.  Limit alcohol use. Moderate alcohol use is considered to be:  No more than 2 drinks per day for men.  No more than 1 drink per day for nonpregnant women.  Eat healthy foods. This involves:  Eating 5 or more servings of fruits and vegetables a day.  Making dietary changes that address high blood pressure (hypertension), high cholesterol, diabetes, or obesity.  Manage your cholesterol levels.  Making food choices that are high in fiber and low in saturated fat, trans fat, and cholesterol may control cholesterol levels.  Take any prescribed medicines to control cholesterol as directed by your health care provider.  Manage your diabetes.  Controlling your carbohydrate and sugar intake is recommended to manage diabetes.  Take any prescribed medicines to control diabetes as directed by your health care provider.  Control your hypertension.  Making food choices that are low in salt (sodium), saturated fat, trans fat, and cholesterol is recommended to manage hypertension.  Take any prescribed medicines to control hypertension as directed by your health care provider.  Maintain a healthy weight.  Reducing calorie intake and making food choices that are low in sodium, saturated fat, trans fat, and cholesterol are recommended to manage weight.  Stop drug abuse.  Avoid taking birth control pills.  Talk to your health care provider about the risks of taking birth control pills if you are over 35 years old, smoke, get migraines, or have ever had a blood clot.  Get evaluated for sleep  disorders (sleep apnea).  Talk to your health care provider about getting a sleep evaluation if you snore a lot or have excessive sleepiness.  Take medicines only as directed by your health care provider.  For some people, aspirin or blood thinners (anticoagulants) are helpful in reducing the risk of forming abnormal blood clots that can lead to stroke. If you have the irregular heart rhythm of atrial fibrillation, you should be on a blood thinner unless there is a good reason you cannot take them.  Understand all your medicine instructions.  Make sure that other conditions (such as anemia or atherosclerosis) are addressed. SEEK IMMEDIATE MEDICAL CARE IF:   You have sudden weakness or numbness of the face, arm, or leg, especially on one side of the body.  Your face or eyelid droops to one side.  You have sudden confusion.  You have trouble speaking (aphasia) or understanding.  You have sudden trouble seeing in one or both eyes.  You have sudden trouble walking.  You have dizziness.  You have a loss of balance or coordination.  You have a sudden, severe headache with no known cause.  You have new chest pain or an irregular heartbeat. Any of these symptoms may represent a serious problem that is an emergency. Do not wait to see if the symptoms will go away. Get medical help at once. Call your local emergency services (911 in [place] Do not drive yourself to the hospital. Document Released: 11/25/2004 Document Revised: 03/04/2014 Document Reviewed: 04/20/2013 [employer] Patient Information 2015 [employer]. This information is not intended to replace advice given   to you by your health care provider. Make sure you discuss any questions you have with your health care provider.     Peripheral Vascular Disease Peripheral Vascular Disease (PVD), also called Peripheral Arterial Disease (PAD), is a circulation problem caused by cholesterol (atherosclerotic plaque) deposits in the  arteries. PVD commonly occurs in the lower extremities (legs) but it can occur in other areas of the body, such as your arms. The cholesterol buildup in the arteries reduces blood flow which can cause pain and other serious problems. The presence of PVD can place a person at risk for Coronary Artery Disease (CAD).  CAUSES  Causes of PVD can be many. It is usually associated with more than one risk factor such as:   High Cholesterol.  Smoking.  Diabetes.  Lack of exercise or inactivity.  High blood pressure (hypertension).  Obesity.  Family history. SYMPTOMS   When the lower extremities are affected, patients with PVD may experience:  Leg pain with exertion or physical activity. This is called INTERMITTENT CLAUDICATION. This may present as cramping or numbness with physical activity. The location of the pain is associated with the level of blockage. For example, blockage at the abdominal level (distal abdominal aorta) may result in buttock or hip pain. Lower leg arterial blockage may result in calf pain.  As PVD becomes more severe, pain can develop with less physical activity.  In people with severe PVD, leg pain may occur at rest.  Other PVD signs and symptoms:  Leg numbness or weakness.  Coldness in the affected leg or foot, especially when compared to the other leg.  A change in leg color.  Patients with significant PVD are more prone to ulcers or sores on toes, feet or legs. These may take longer to heal or may reoccur. The ulcers or sores can become infected.  If signs and symptoms of PVD are ignored, gangrene may occur. This can result in the loss of toes or loss of an entire limb.  Not all leg pain is related to PVD. Other medical conditions can cause leg pain such as:  Blood clots (embolism) or Deep Vein Thrombosis.  Inflammation of the blood vessels (vasculitis).  Spinal stenosis. DIAGNOSIS  Diagnosis of PVD can involve several different types of tests. These  can include:  Pulse Volume Recording Method (PVR). This test is simple, painless and does not involve the use of X-rays. PVR involves measuring and comparing the blood pressure in the arms and legs. An ABI (Ankle-Brachial Index) is calculated. The normal ratio of blood pressures is 1. As this number becomes smaller, it indicates more severe disease.  < 0.95 - indicates significant narrowing in one or more leg vessels.  <0.8 - there will usually be pain in the foot, leg or buttock with exercise.  <0.4 - will usually have pain in the legs at rest.  <0.25 - usually indicates limb threatening PVD.  Doppler detection of pulses in the legs. This test is painless and checks to see if you have a pulses in your legs/feet.  A dye or contrast material (a substance that highlights the blood vessels so they show up on x-ray) may be given to help your caregiver better see the arteries for the following tests. The dye is eliminated from your body by the kidney's. Your caregiver may order blood work to check your kidney function and other laboratory values before the following tests are performed:  Magnetic Resonance Angiography (MRA). An MRA is a picture study of the   blood vessels and arteries. The MRA machine uses a large magnet to produce images of the blood vessels.  Computed Tomography Angiography (CTA). A CTA is a specialized x-ray that looks at how the blood flows in your blood vessels. An IV may be inserted into your arm so contrast dye can be injected.  Angiogram. Is a procedure that uses x-rays to look at your blood vessels. This procedure is minimally invasive, meaning a small incision (cut) is made in your groin. A small tube (catheter) is then inserted into the artery of your groin. The catheter is guided to the blood vessel or artery your caregiver wants to examine. Contrast dye is injected into the catheter. X-rays are then taken of the blood vessel or artery. After the images are obtained, the  catheter is taken out. TREATMENT  Treatment of PVD involves many interventions which may include:  Lifestyle changes:  Quitting smoking.  Exercise.  Following a low fat, low cholesterol diet.  Control of diabetes.  Foot care is very important to the PVD patient. Good foot care can help prevent infection.  Medication:  Cholesterol-lowering medicine.  Blood pressure medicine.  Anti-platelet drugs.  Certain medicines may reduce symptoms of Intermittent Claudication.  Interventional/Surgical options:  Angioplasty. An Angioplasty is a procedure that inflates a balloon in the blocked artery. This opens the blocked artery to improve blood flow.  Stent Implant. A wire mesh tube (stent) is placed in the artery. The stent expands and stays in place, allowing the artery to remain open.  Peripheral Bypass Surgery. This is a surgical procedure that reroutes the blood around a blocked artery to help improve blood flow. This type of procedure may be performed if Angioplasty or stent implants are not an option. SEEK IMMEDIATE MEDICAL CARE IF:   You develop pain or numbness in your arms or legs.  Your arm or leg turns cold, becomes blue in color.  You develop redness, warmth, swelling and pain in your arms or legs. MAKE SURE YOU:   Understand these instructions.  Will watch your condition.  Will get help right away if you are not doing well or get worse. Document Released: 11/25/2004 Document Revised: 01/10/2012 Document Reviewed: 10/22/2008 Exit[employer] Patient Information 2015 [employer]. This information is not intended to replace advice given to you by your health care provider. Make sure you discuss any questions you have with your health care provider.    Smoking Cessation Quitting smoking is important to your health and has many advantages. However, it is not always easy to quit since nicotine is a very addictive drug. Oftentimes, people try 3 times or more before being  able to quit. This document explains the best ways for you to prepare to quit smoking. Quitting takes hard work and a lot of effort, but you can do it. ADVANTAGES OF QUITTING SMOKING  You will live longer, feel better, and live better.  Your body will feel the impact of quitting smoking almost immediately.  Within 20 minutes, blood pressure decreases. Your pulse returns to its normal level.  After 8 hours, carbon monoxide levels in the blood return to normal. Your oxygen level increases.  After 24 hours, the chance of having a heart attack starts to decrease. Your breath, hair, and body stop smelling like smoke.  After 48 hours, damaged nerve endings begin to recover. Your sense of taste and smell improve.  After 72 hours, the body is virtually free of nicotine. Your bronchial tubes relax and breathing becomes easier.    After 2 to 12 weeks, lungs can hold more air. Exercise becomes easier and circulation improves.  The risk of having a heart attack, stroke, cancer, or lung disease is greatly reduced.  After 1 year, the risk of coronary heart disease is cut in half.  After 5 years, the risk of stroke falls to the same as a nonsmoker.  After 10 years, the risk of lung cancer is cut in half and the risk of other cancers decreases significantly.  After 15 years, the risk of coronary heart disease drops, usually to the level of a nonsmoker.  If you are pregnant, quitting smoking will improve your chances of having a healthy baby.  The people you live with, especially any children, will be healthier.  You will have extra money to spend on things other than cigarettes. QUESTIONS TO THINK ABOUT BEFORE ATTEMPTING TO QUIT You may want to talk about your answers with your health care provider.  Why do you want to quit?  If you tried to quit in the past, what helped and what did not?  What will be the most difficult situations for you after you quit? How will you plan to handle  them?  Who can help you through the tough times? Your family? Friends? A health care provider?  What pleasures do you get from smoking? What ways can you still get pleasure if you quit? Here are some questions to ask your health care provider:  How can you help me to be successful at quitting?  What medicine do you think would be best for me and how should I take it?  What should I do if I need more help?  What is smoking withdrawal like? How can I get information on withdrawal? GET READY  Set a quit date.  Change your environment by getting rid of all cigarettes, ashtrays, matches, and lighters in your home, car, or work. Do not let people smoke in your home.  Review your past attempts to quit. Think about what worked and what did not. GET SUPPORT AND ENCOURAGEMENT You have a better chance of being successful if you have help. You can get support in many ways.  Tell your family, friends, and coworkers that you are going to quit and need their support. Ask them not to smoke around you.  Get individual, group, or telephone counseling and support. Programs are available at local hospitals and health centers. Call your local health department for information about programs in your area.  Spiritual beliefs and practices may help some smokers quit.  Download a "quit meter" on your computer to keep track of quit statistics, such as how long you have gone without smoking, cigarettes not smoked, and money saved.  Get a self-help book about quitting smoking and staying off tobacco. LEARN NEW SKILLS AND BEHAVIORS  Distract yourself from urges to smoke. Talk to someone, go for a walk, or occupy your time with a task.  Change your normal routine. Take a different route to work. Drink tea instead of coffee. Eat breakfast in a different place.  Reduce your stress. Take a hot bath, exercise, or read a book.  Plan something enjoyable to do every day. Reward yourself for not  smoking.  Explore interactive web-based programs that specialize in helping you quit. GET MEDICINE AND USE IT CORRECTLY Medicines can help you stop smoking and decrease the urge to smoke. Combining medicine with the above behavioral methods and support can greatly increase your chances of successfully quitting smoking.    Nicotine replacement therapy helps deliver nicotine to your body without the negative effects and risks of smoking. Nicotine replacement therapy includes nicotine gum, lozenges, inhalers, nasal sprays, and skin patches. Some may be available over-the-counter and others require a prescription.  Antidepressant medicine helps people abstain from smoking, but how this works is unknown. This medicine is available by prescription.  Nicotinic receptor partial agonist medicine simulates the effect of nicotine in your brain. This medicine is available by prescription. Ask your health care provider for advice about which medicines to use and how to use them based on your health history. Your health care provider will tell you what side effects to look out for if you choose to be on a medicine or therapy. Carefully read the information on the package. Do not use any other product containing nicotine while using a nicotine replacement product.  RELAPSE OR DIFFICULT SITUATIONS Most relapses occur within the first 3 months after quitting. Do not be discouraged if you start smoking again. Remember, most people try several times before finally quitting. You may have symptoms of withdrawal because your body is used to nicotine. You may crave cigarettes, be irritable, feel very hungry, cough often, get headaches, or have difficulty concentrating. The withdrawal symptoms are only temporary. They are strongest when you first quit, but they will go away within 10-14 days. To reduce the chances of relapse, try to:  Avoid drinking alcohol. Drinking lowers your chances of successfully quitting.  Reduce the  amount of caffeine you consume. Once you quit smoking, the amount of caffeine in your body increases and can give you symptoms, such as a rapid heartbeat, sweating, and anxiety.  Avoid smokers because they can make you want to smoke.  Do not let weight gain distract you. Many smokers will gain weight when they quit, usually less than 10 pounds. Eat a healthy diet and stay active. You can always lose the weight gained after you quit.  Find ways to improve your mood other than smoking. FOR MORE INFORMATION  www.smokefree.gov  Document Released: 10/12/2001 Document Revised: 03/04/2014 Document Reviewed: 01/27/2012 [employer] Patient Information 2015 [employer]. This information is not intended to replace advice given to you by your health care provider. Make sure you discuss any questions you have with your health care provider.    Smoking Cessation, Tips for Success If you are ready to quit smoking, congratulations! You have chosen to help yourself be healthier. Cigarettes bring nicotine, tar, carbon monoxide, and other irritants into your body. Your lungs, heart, and blood vessels will be able to work better without these poisons. There are many different ways to quit smoking. Nicotine gum, nicotine patches, a nicotine inhaler, or nicotine nasal spray can help with physical craving. Hypnosis, support groups, and medicines help break the habit of smoking. WHAT THINGS CAN I DO TO MAKE QUITTING EASIER?  Here are some tips to help you quit for good:  Pick a date when you will quit smoking completely. Tell all of your friends and family about your plan to quit on that date.  Do not try to slowly cut down on the number of cigarettes you are smoking. Pick a quit date and quit smoking completely starting on that day.  Throw away all cigarettes.   Clean and remove all ashtrays from your home, work, and car.  On a card, write down your reasons for quitting. Carry the card with you and read it  when you get the urge to smoke.  Cleanse your body   of nicotine. Drink enough water and fluids to keep your urine clear or pale yellow. Do this after quitting to flush the nicotine from your body.  Learn to predict your moods. Do not let a bad situation be your excuse to have a cigarette. Some situations in your life might tempt you into wanting a cigarette.  Never have "just one" cigarette. It leads to wanting another and another. Remind yourself of your decision to quit.  Change habits associated with smoking. If you smoked while driving or when feeling stressed, try other activities to replace smoking. Stand up when drinking your coffee. Brush your teeth after eating. Sit in a different chair when you read the paper. Avoid alcohol while trying to quit, and try to drink fewer caffeinated beverages. Alcohol and caffeine may urge you to smoke.  Avoid foods and drinks that can trigger a desire to smoke, such as sugary or spicy foods and alcohol.  Ask people who smoke not to smoke around you.  Have something planned to do right after eating or having a cup of coffee. For example, plan to take a walk or exercise.  Try a relaxation exercise to calm you down and decrease your stress. Remember, you may be tense and nervous for the first 2 weeks after you quit, but this will pass.  Find new activities to keep your hands busy. Play with a pen, coin, or rubber band. Doodle or draw things on paper.  Brush your teeth right after eating. This will help cut down on the craving for the taste of tobacco after meals. You can also try mouthwash.   Use oral substitutes in place of cigarettes. Try using lemon drops, carrots, cinnamon sticks, or chewing gum. Keep them handy so they are available when you have the urge to smoke.  When you have the urge to smoke, try deep breathing.  Designate your home as a nonsmoking area.  If you are a heavy smoker, ask your health care provider about a prescription for  nicotine chewing gum. It can ease your withdrawal from nicotine.  Reward yourself. Set aside the cigarette money you save and buy yourself something nice.  Look for support from others. Join a support group or smoking cessation program. Ask someone at home or at work to help you with your plan to quit smoking.  Always ask yourself, "Do I need this cigarette or is this just a reflex?" Tell yourself, "Today, I choose not to smoke," or "I do not want to smoke." You are reminding yourself of your decision to quit.  Do not replace cigarette smoking with electronic cigarettes (commonly called e-cigarettes). The safety of e-cigarettes is unknown, and some may contain harmful chemicals.  If you relapse, do not give up! Plan ahead and think about what you will do the next time you get the urge to smoke. HOW WILL I FEEL WHEN I QUIT SMOKING? You may have symptoms of withdrawal because your body is used to nicotine (the addictive substance in cigarettes). You may crave cigarettes, be irritable, feel very hungry, cough often, get headaches, or have difficulty concentrating. The withdrawal symptoms are only temporary. They are strongest when you first quit but will go away within 10-14 days. When withdrawal symptoms occur, stay in control. Think about your reasons for quitting. Remind yourself that these are signs that your body is healing and getting used to being without cigarettes. Remember that withdrawal symptoms are easier to treat than the major diseases that smoking can cause.    Even after the withdrawal is over, expect periodic urges to smoke. However, these cravings are generally short lived and will go away whether you smoke or not. Do not smoke! WHAT RESOURCES ARE AVAILABLE TO HELP ME QUIT SMOKING? Your health care provider can direct you to community resources or hospitals for support, which may include:  Group support.  Education.  Hypnosis.  Therapy. Document Released: 07/16/2004 Document  Revised: 03/04/2014 Document Reviewed: 04/05/2013 [employer] Patient Information 2015 [employer]. This information is not intended to replace advice given to you by your health care provider. Make sure you discuss any questions you have with your health care provider.  

## 2015-04-09 NOTE — Progress Notes (Signed)
[facility] [place] HISTORY AND PHYSICAL   MRN : [id number]  History of Present Illness:   Sherri Hays is a 64 y.o. female who is s/p right CEA on 09/21/2012 by Dr. Kellie Simmering. 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 She also has PAD.  She has chronic right arm weakness which has not changed since her surgery.  She is in a nursing facility and is on nasal oxygen at night.   I last saw her 12/31/14. At that time I requested over a year follow up with carotid Duplex and ABI's, both were denied by her insurer.   Has what sounds like neuropathy symptoms in her feet at times (is diabetic), with numbness, tingling, and burning intermittently.   Patient states pain in both calves with standing, postpones walking due to this, walks with walker only to get to the bathroom; denies non-healing wounds   March, 2015 she had pt had an injection in her right hip, helped her right hip pain for a while. She takes a daily ASA, a statin, and clopidogrel.   Pt Diabetic: Yes, pt. States she was told that her sugar is in good control  Pt smoker: smoker (1/2 ppd x 30 yrs); states she has tried Chantix and states it did not decrease her urge to smoke.      Current Outpatient Prescriptions  Medication Sig Dispense Refill  . acetaminophen (TYLENOL) 325 MG tablet Take 650 mg by mouth every 6 (six) hours as needed.    Marland Kitchen albuterol (PROVENTIL HFA;VENTOLIN HFA) 108 (90 BASE) MCG/ACT inhaler Inhale 2 puffs into the lungs every 6 (six) hours as needed. For dyspnea 1 Inhaler 11  . amLODipine (NORVASC) 10 MG tablet Take 10 mg by mouth daily.    Marland Kitchen aspirin 81 MG tablet Take 81 mg by mouth daily.    Marland Kitchen atorvastatin (LIPITOR) 10 MG tablet Take 1 tablet (10 mg total) by mouth at bedtime. For cholesterol.    . clopidogrel (PLAVIX) 75 MG tablet Take 75 mg by mouth daily with breakfast.    . diclofenac (VOLTAREN) 25 MG EC tablet Take 25 mg by  mouth 2 (two) times daily.    . ferrous fumarate (HEMOCYTE - 106 MG FE) 325 (106 FE) MG TABS tablet Take 1 tablet by mouth.    . fluPHENAZine (PROLIXIN) 5 MG tablet Take 10 mg by mouth at bedtime.    . Fluticasone-Salmeterol (ADVAIR DISKUS) 250-50 MCG/DOSE AEPB Inhale 1 puff into the lungs 2 (two) times daily. For COPD 60 each 11  . gabapentin (NEURONTIN) 300 MG capsule Take 300 mg by mouth 3 (three) times daily. For anxiety and depression.     . haloperidol decanoate (HALDOL DECANOATE) 100 MG/ML injection Inject 75 mg into the muscle every 28 (twenty-eight) days.    . hydrALAZINE (APRESOLINE) 50 MG tablet Take 1 tablet (50 mg total) by mouth 3 (three) times daily. For elevated blood pressure. 90 tablet 0  . hydrOXYzine (ATARAX/VISTARIL) 25 MG tablet Take 25 mg by mouth every 4 (four) hours as needed. For anxiety    . insulin detemir (LEVEMIR) 100 UNIT/ML injection Inject into the skin at bedtime.    . insulin glargine (LANTUS) 100 UNIT/ML injection Inject 30 Units into the skin at bedtime. For glycemic control. 10 mL   . Maltodextrin-Xanthan Gum (RESOURCE THICKENUP CLEAR) POWD usem in liquids as needed    . metFORMIN (GLUCOPHAGE) 500 MG tablet Take 1  tablet (500 mg total) by mouth 2 (two) times daily with a meal. For glycemic control.    . Multiple Vitamins-Minerals (THERA M PLUS PO) Take by mouth.    . oxybutynin (DITROPAN-XL) 10 MG 24 hr tablet Take 10 mg by mouth daily.    Marland Kitchen oxyCODONE-acetaminophen (PERCOCET/ROXICET) 5-325 MG per tablet Take 1-2 tablets by mouth every 4 (four) hours as needed. 20 tablet 0  . pantoprazole (PROTONIX) 40 MG tablet Take 40 mg by mouth daily.    Marland Kitchen Propylene Glycol (SYSTANE BALANCE OP) Apply to eye.    . risperiDONE microspheres (RISPERDAL CONSTA) 25 MG injection Inject 25 mg into the muscle every 14 (fourteen) days.    Marland Kitchen sertraline (ZOLOFT) 100 MG tablet Take 2 tablets (200 mg total) by mouth daily. For depression and anxiety. 60 tablet 0  . traZODone (DESYREL) 100  MG tablet Take 100 mg by mouth at bedtime.    . trihexyphenidyl (ARTANE) 2 MG tablet Take 0.5 tablets (1 mg total) by mouth at bedtime. For EPS. 15 tablet 0  . trihexyphenidyl (ARTANE) 2 MG tablet Take 1 mg by mouth at bedtime.     No current facility-administered medications for this visit.    Past Medical History  Diagnosis Date  . Dysphagia   . TIA (transient ischemic attack)   . Genital herpes   . DM neuropathies   . Anemia   . Asthma   . Cancer     lt breast  . Schizophrenia, simple   . Diabetes mellitus   . Pneumonia     hx of  . Sleep apnea     "wears cpap, unsure of settings"  . Stroke     2011  . Neuromuscular disorder     diabetic neuropathy  . COPD (chronic obstructive pulmonary disease)   . Hypertension     physician home visits, Dr. Fredderick Phenix [phone]  . Hyperlipidemia   . Arthritis     Osteoarthritis  . DJD (degenerative joint disease)     Social History History  Substance Use Topics  . Smoking status: Current Some Day Smoker -- 0.50 packs/day    Types: Cigarettes  . Smokeless tobacco: Never Used  . Alcohol Use: No    Family History No family history on file.  Surgical History Past Surgical History  Procedure Laterality Date  . Cholecystectomy    . Cesarean section    . Tonsillectomy    . Endarterectomy  09/21/2012    Procedure: ENDARTERECTOMY CAROTID;  Surgeon: Mal Misty, MD;  Location: [facility];  Service: Vascular;  Laterality: Right;  . Patch angioplasty  09/21/2012    Procedure: PATCH ANGIOPLASTY;  Surgeon: Mal Misty, MD;  Location: [facility];  Service: Vascular;  Laterality: Right;  with dacron patch angioplasty  . Carotid endarterectomy      Allergies  Allergen Reactions  . Penicillins Other (See Comments)    Unknown ( Pt was told as a baby, she is allergic to Penicillins )    Current Outpatient Prescriptions  Medication Sig Dispense Refill  . acetaminophen (TYLENOL) 325 MG tablet Take 650 mg by mouth every 6 (six) hours as  needed.    Marland Kitchen albuterol (PROVENTIL HFA;VENTOLIN HFA) 108 (90 BASE) MCG/ACT inhaler Inhale 2 puffs into the lungs every 6 (six) hours as needed. For dyspnea 1 Inhaler 11  . amLODipine (NORVASC) 10 MG tablet Take 10 mg by mouth daily.    Marland Kitchen aspirin 81 MG tablet Take 81 mg by mouth daily.    Marland Kitchen  atorvastatin (LIPITOR) 10 MG tablet Take 1 tablet (10 mg total) by mouth at bedtime. For cholesterol.    . clopidogrel (PLAVIX) 75 MG tablet Take 75 mg by mouth daily with breakfast.    . diclofenac (VOLTAREN) 25 MG EC tablet Take 25 mg by mouth 2 (two) times daily.    . ferrous fumarate (HEMOCYTE - 106 MG FE) 325 (106 FE) MG TABS tablet Take 1 tablet by mouth.    . fluPHENAZine (PROLIXIN) 5 MG tablet Take 10 mg by mouth at bedtime.    . Fluticasone-Salmeterol (ADVAIR DISKUS) 250-50 MCG/DOSE AEPB Inhale 1 puff into the lungs 2 (two) times daily. For COPD 60 each 11  . gabapentin (NEURONTIN) 300 MG capsule Take 300 mg by mouth 3 (three) times daily. For anxiety and depression.     . haloperidol decanoate (HALDOL DECANOATE) 100 MG/ML injection Inject 75 mg into the muscle every 28 (twenty-eight) days.    . hydrALAZINE (APRESOLINE) 50 MG tablet Take 1 tablet (50 mg total) by mouth 3 (three) times daily. For elevated blood pressure. 90 tablet 0  . hydrOXYzine (ATARAX/VISTARIL) 25 MG tablet Take 25 mg by mouth every 4 (four) hours as needed. For anxiety    . insulin detemir (LEVEMIR) 100 UNIT/ML injection Inject into the skin at bedtime.    . insulin glargine (LANTUS) 100 UNIT/ML injection Inject 30 Units into the skin at bedtime. For glycemic control. 10 mL   . Maltodextrin-Xanthan Gum (RESOURCE THICKENUP CLEAR) POWD usem in liquids as needed    . metFORMIN (GLUCOPHAGE) 500 MG tablet Take 1 tablet (500 mg total) by mouth 2 (two) times daily with a meal. For glycemic control.    . Multiple Vitamins-Minerals (THERA M PLUS PO) Take by mouth.    . oxybutynin (DITROPAN-XL) 10 MG 24 hr tablet Take 10 mg by mouth daily.     Marland Kitchen oxyCODONE-acetaminophen (PERCOCET/ROXICET) 5-325 MG per tablet Take 1-2 tablets by mouth every 4 (four) hours as needed. 20 tablet 0  . pantoprazole (PROTONIX) 40 MG tablet Take 40 mg by mouth daily.    Marland Kitchen Propylene Glycol (SYSTANE BALANCE OP) Apply to eye.    . risperiDONE microspheres (RISPERDAL CONSTA) 25 MG injection Inject 25 mg into the muscle every 14 (fourteen) days.    Marland Kitchen sertraline (ZOLOFT) 100 MG tablet Take 2 tablets (200 mg total) by mouth daily. For depression and anxiety. 60 tablet 0  . traZODone (DESYREL) 100 MG tablet Take 100 mg by mouth at bedtime.    . trihexyphenidyl (ARTANE) 2 MG tablet Take 0.5 tablets (1 mg total) by mouth at bedtime. For EPS. 15 tablet 0  . trihexyphenidyl (ARTANE) 2 MG tablet Take 1 mg by mouth at bedtime.     No current facility-administered medications for this visit.     REVIEW OF SYSTEMS: See HPI for pertinent positives and negatives.  Physical Examination Filed Vitals:   04/09/15 1446 04/09/15 1450  BP: 133/80 129/71  Pulse: 77 75  Resp:  16  Height:  5\' 3"  (1.6 m)  Weight:  206 lb (93.441 kg)  SpO2:  98%   Body mass index is 36.5 kg/(m^2).   General: WDWN obese female in NAD  GAIT: in wheelchair   Eyes: Pupils =  Pulmonary: diminished air movement in all fields, Negative Rales, Negative rhonchi, & slight wheezing.  Cardiac: regular Rhythm, no appreciable murmur, no peripheral edema  VASCULAR EXAM  Carotid Bruits  Left  Right    Negative  Negative    LE  Pulses  LEFT  RIGHT   POPLITEAL  not palpable  not palpable   POSTERIOR TIBIAL  not palpable  not palpable   DORSALIS PEDIS  ANTERIOR TIBIAL  Not palpable  2+palpable    Gastrointestinal: soft, obese, nontender, BS WNL, no r/g, no palpable masses.  Musculoskeletal: Negative muscle atrophy/wasting. M/S 4/5 throughout except right LE is 3/5, Extremities without ischemic changes.  Neurologic: A&O to person and place, states the  current year is 1956; Appropriate Affect; MOTOR FUNCTION: no muscle wasting or atrophy  Speech is slow but fluent, edentulous.  CN 2-12 intact, Pain and light touch intact in extremities, Motor exam as listed above.                 Non-Invasive Vascular Imaging (04/09/2015):  CEREBROVASCULAR DUPLEX EVALUATION    INDICATION: Carotid endarterectomy     PREVIOUS INTERVENTION(S): Right carotid endarterectomy in Nov. 2013    DUPLEX EXAM:     RIGHT  LEFT  Peak Systolic Velocities (cm/s) End Diastolic Velocities (cm/s) Plaque LOCATION Peak Systolic Velocities (cm/s) End Diastolic Velocities (cm/s) Plaque  80 14  CCA PROXIMAL 70 0   86 15 HT CCA MID 71 0   66 8  CCA DISTAL 57 6   113 7  ECA 122 10   98 28  ICA PROXIMAL Occluded  HT  75 25  ICA MID Occluded  HT  Not Visualized    ICA DISTAL Occluded      Not Calculated ICA / CCA Ratio (PSV) Not Calculated  Antegrade Vertebral Flow Antegrade  623 Brachial Systolic Pressure (mmHg) 762  Multiphasic (subclavian artery) Brachial Artery Waveforms Multiphasic (subclavian artery)    Plaque Morphology:  HM = Homogeneous, HT = Heterogeneous, CP = Calcific Plaque, SP = Smooth Plaque, IP = Irregular Plaque     ADDITIONAL FINDINGS: . Technically difficult study due to the patient's short neck. . No significant stenosis of the bilateral external or common carotid arteries.    IMPRESSION: 1. Patent right carotid endarterectomy site with no right internal carotid artery stenosis, based on decreased visualization, as described above. 2. Known left internal carotid artery occlusion noted.    Compared to the previous exam:  No significant change noted when compared to the previous exam on 09/19/13.      ABI (Date: 04/09/2015)  R: 0.71 (0.68, 03/19/14) , DP: biphasic, PT: biphasic, TBI: 0.67 L: 0.49 (0.56), DP: monophasic, PT: not detected, TBI: 0.45   ASSESSMENT:  MADISEN LUDVIGSEN is a 64 y.o. 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  Her risk factors for the progression of atherosclerosis are DM, smoking, obesity, chronic sedentarism. She has a history of falling and does not walk much due to this; she gets around in a wheelchair. She has no signs of ischemia in her LE's. Right LE ABI remains stable with moderate arterial occlusive disease, left LE ABI has decreased slightly with severe arterial occlusive disease. See Plan.  PLAN:   The patient was counseled re smoking cessation and given several free resources re smoking cessation.  Active ROM of legs exercises 1-3x/day in the form of treadmill with gait belt for safety or seated leg exercises, or as suggested by facility physical therapy.  Based on today's exam and non-invasive vascular lab results, the patient will follow up in 1 year with carotid Duplex and ABI's. Nursing facility  to call if pt develops non healing wounds or signs of ischemia in her feet/legs.  I discussed in depth with the patient the nature of atherosclerosis, and emphasized the importance of maximal medical management including strict control of blood pressure, blood glucose, and lipid levels, obtaining regular exercise, and cessation of smoking.  The patient is aware that without maximal medical management the underlying atherosclerotic disease process will progress, limiting the benefit of any interventions.  The patient was given information about stroke prevention and what symptoms should prompt the patient to seek immediate medical care.  The patient was given information about PAD including signs, symptoms, treatment, what symptoms should prompt the patient to seek immediate medical care, and risk reduction measures to take. Thank you for allowing [place] to participate in this patient's care.  Clemon Chambers, RN, MSN, FNP-C [facility] Office: [phone]  Clinic MD: Oneida Alar   04/09/2015 2:49 PM

## 2015-04-10 NOTE — Addendum Note (Signed)
Addended by: Dorthula Rue L on: 04/10/2015 03:06 PM   Modules accepted: Orders

## 2016-04-12 ENCOUNTER — Encounter: Payer: Self-pay | Admitting: Family

## 2016-04-20 ENCOUNTER — Ambulatory Visit: Payer: Medicaid Other | Admitting: Family

## 2016-04-20 ENCOUNTER — Encounter (HOSPITAL_COMMUNITY): Payer: Self-pay

## 2022-06-01 DEATH — deceased
